# Patient Record
Sex: Female | Born: 1985
Health system: Southern US, Community
[De-identification: ages and names within clinical notes are randomized; demographics above are authoritative.]

## PROBLEM LIST (undated history)

## (undated) ENCOUNTER — Inpatient Hospital Stay (HOSPITAL_COMMUNITY): Payer: Self-pay

## (undated) DIAGNOSIS — F32A Depression, unspecified: Secondary | ICD-10-CM

## (undated) DIAGNOSIS — D219 Benign neoplasm of connective and other soft tissue, unspecified: Secondary | ICD-10-CM

## (undated) DIAGNOSIS — Z789 Other specified health status: Secondary | ICD-10-CM

## (undated) DIAGNOSIS — F329 Major depressive disorder, single episode, unspecified: Secondary | ICD-10-CM

## (undated) HISTORY — PX: WISDOM TOOTH EXTRACTION: SHX21

---

## 2009-09-27 ENCOUNTER — Encounter: Admission: RE | Admit: 2009-09-27 | Discharge: 2009-09-27 | Payer: Self-pay | Admitting: Internal Medicine

## 2011-02-08 IMAGING — US US PELVIS COMPLETE
1 series · 14 of 25 positions shown · non-contrast
Comparison: None.

CLINICAL DATA: Pelvic pain.  Question ovarian cyst.

TRANSABDOMINAL AND TRANSVAGINAL ULTRASOUND OF PELVIS
TECHNIQUE: Both transabdominal and transvaginal ultrasound
examinations of the pelvis were performed including evaluation of
the uterus, ovaries, adnexal regions, and pelvic cul-de-sac.

[Series 1: us pelvis complete · 0.22mm/px · 14 of 85 slices shown]
[im 1/85]
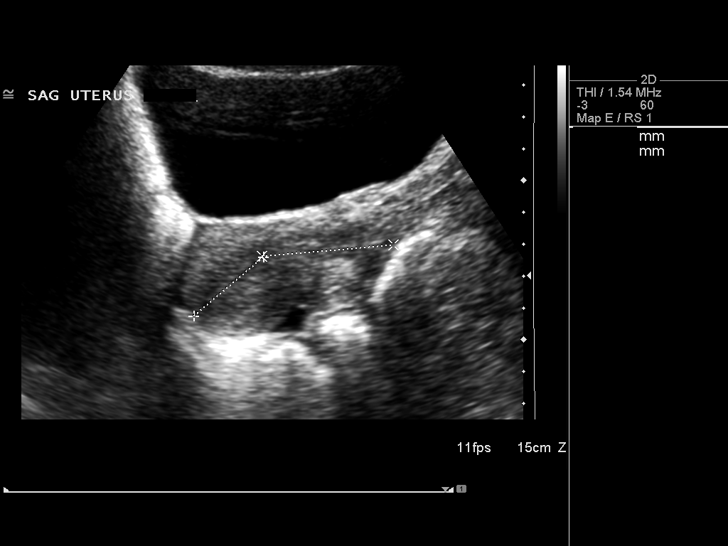
[im 8/85]
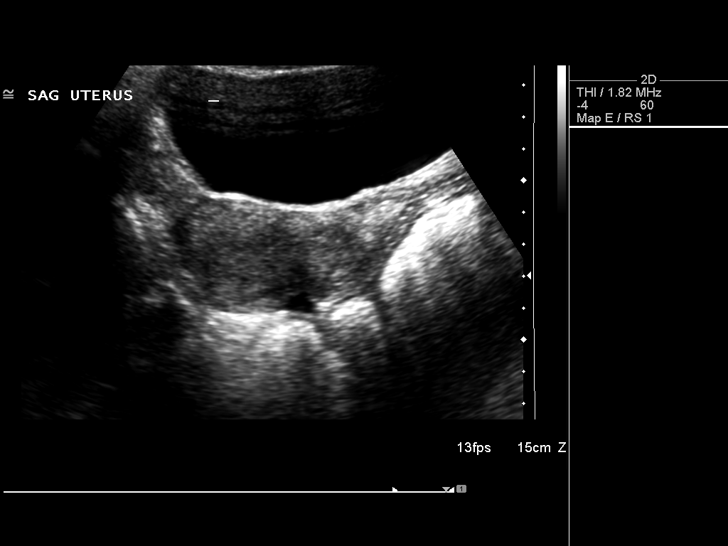
[im 15/85]
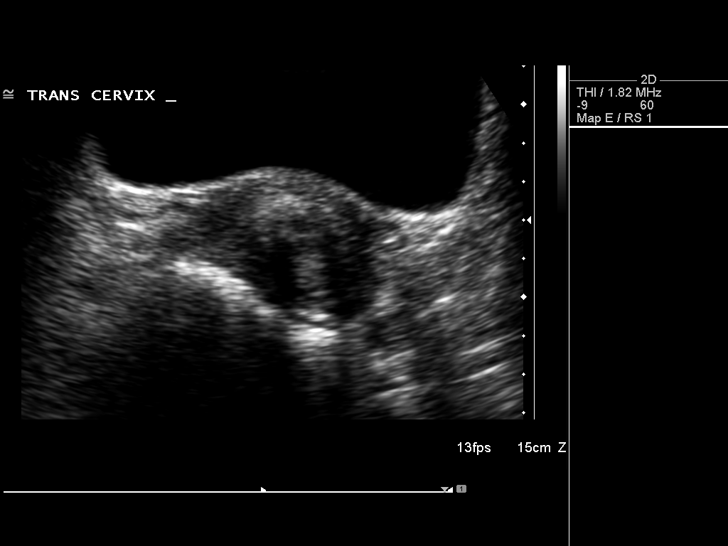
[im 22/85]
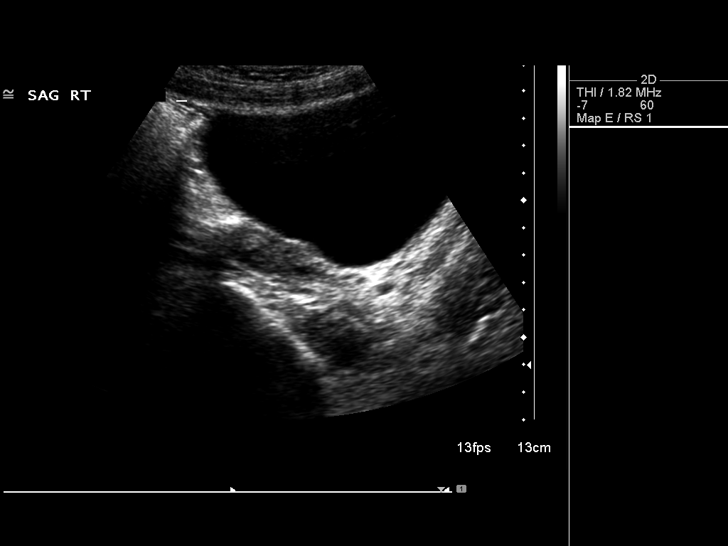
[im 29/85]
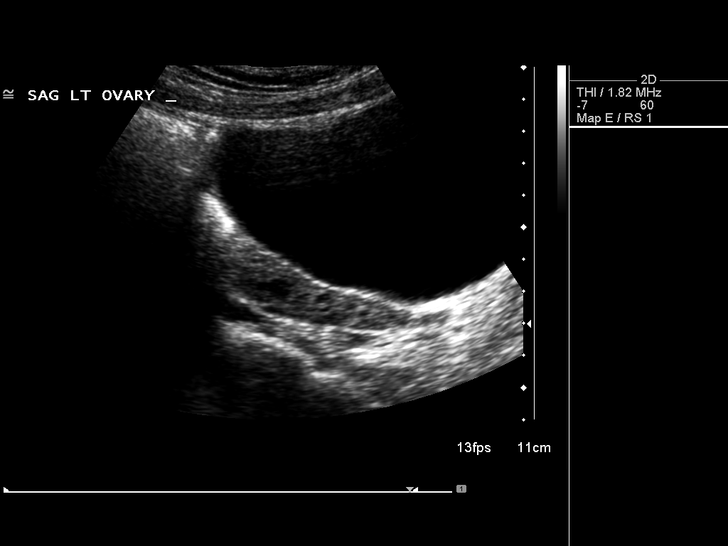
[im 32/85]
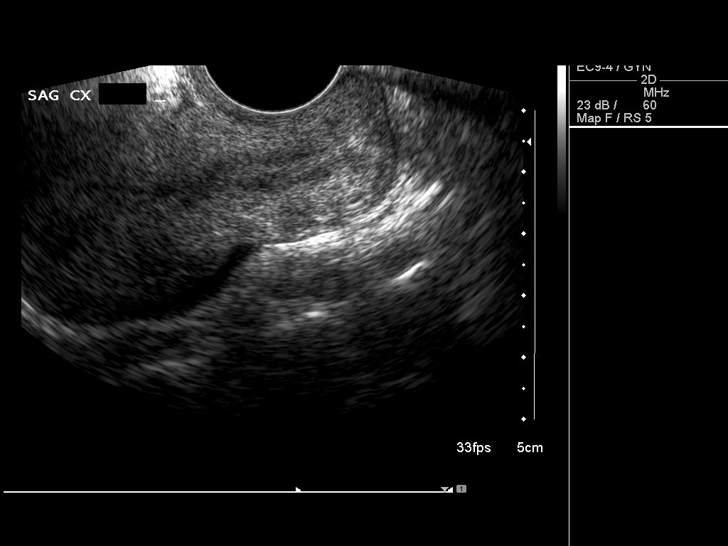
[im 39/85]
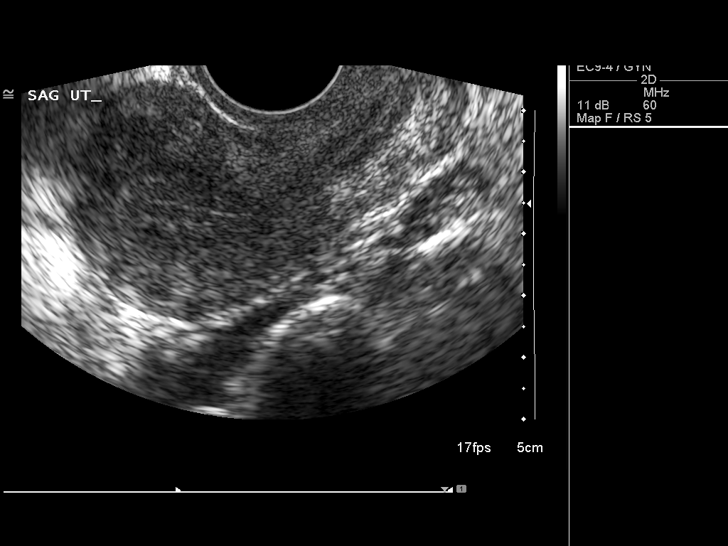
[im 46/85]
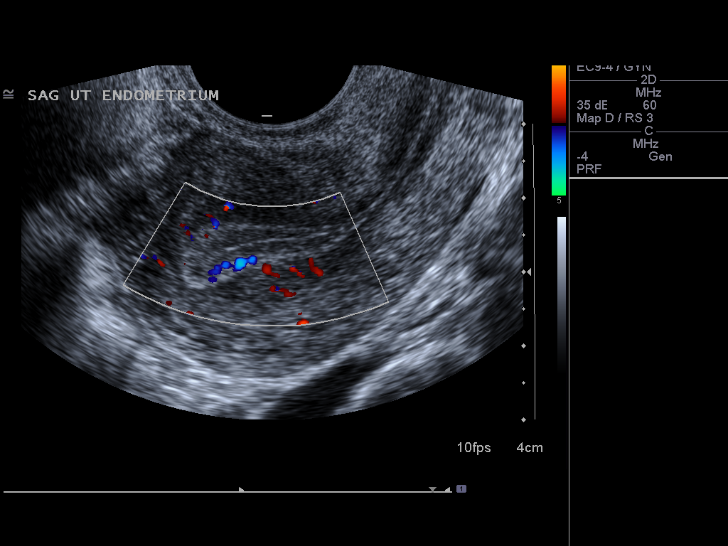
[im 53/85]
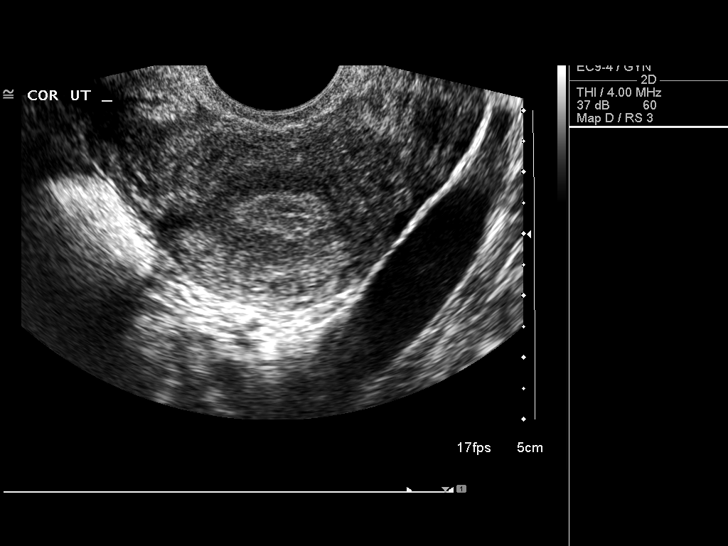
[im 57/85]
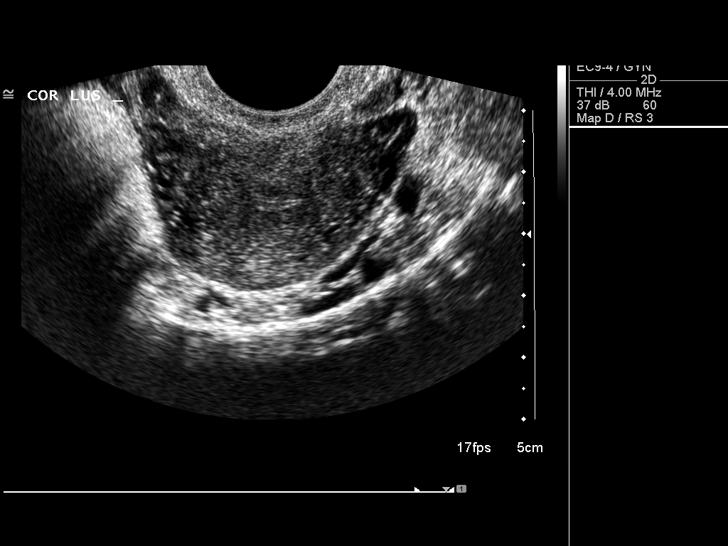
[im 64/85]
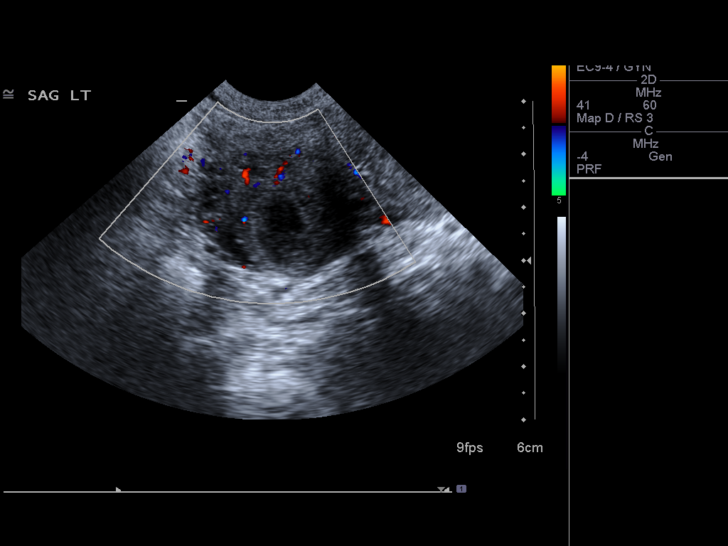
[im 71/85]
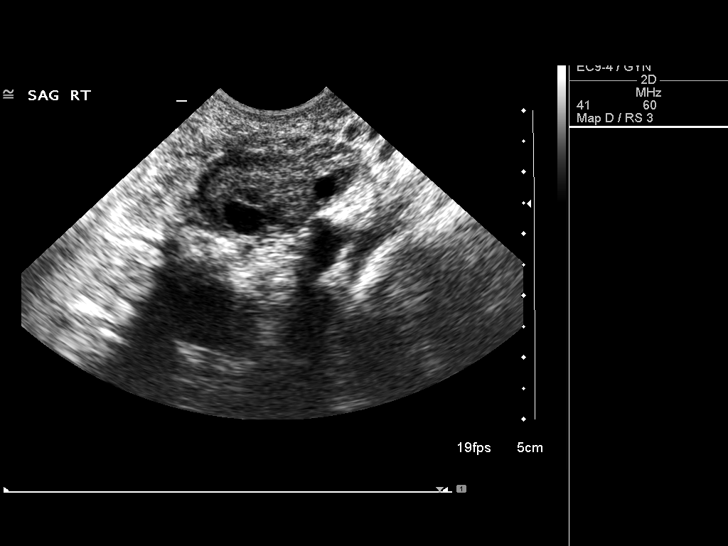
[im 78/85]
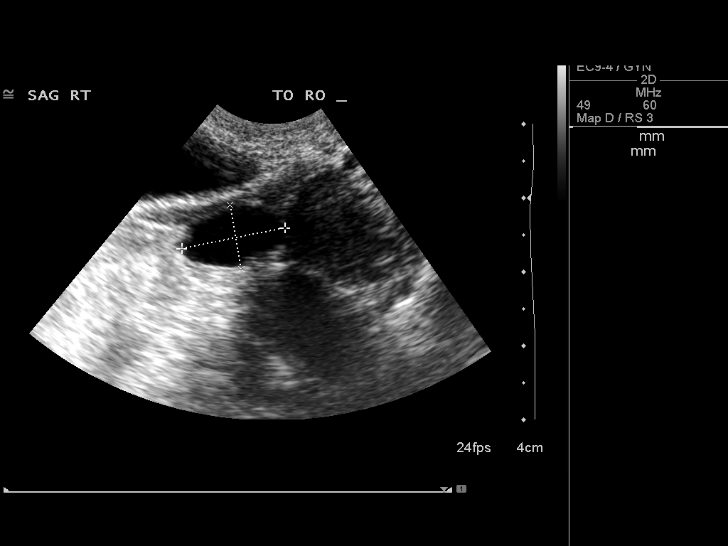
[im 85/85]
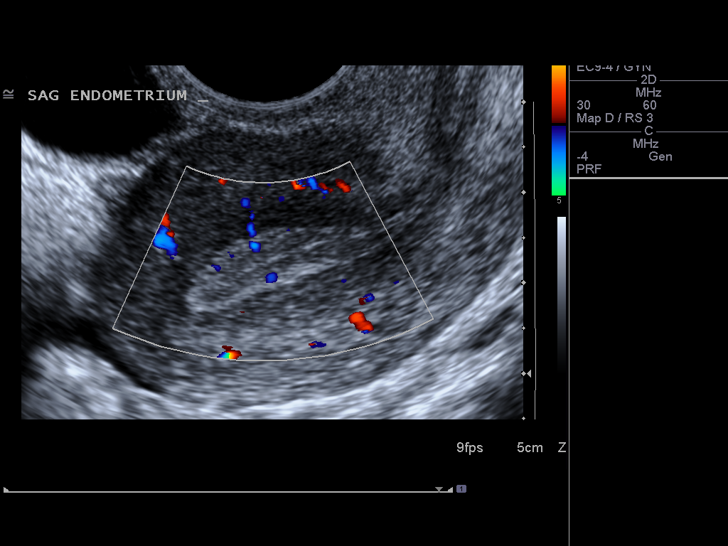

[14 of 25 positions shown; findings below may reference images not displayed]

FINDINGS: Uterus measures 6.8 cm in length and 4.0 x 4.0 cm transversely.

Endometrium measures 7.9 mm in thickness.  Prominent degree of
color flow vascularity within the endometrium.

Right Ovary measures 2.6 x 1.5 x 2.7 cm.  There is a simple cyst in
right ovary measuring 1.4 x 0.9 x 1.6 cm.

Left Ovary measures 2.9 x 2.2 x 2.6 cm.  Findings compatible with a
corpus luteal cyst measuring 1.6 x 1.8 x 1.5 cm.

Other Findings:  Small amount of free pelvic fluid.
IMPRESSION: Simple right ovarian cyst.  Probable corpus luteal cyst on the
left.  Small amount of free fluid.

## 2015-03-08 ENCOUNTER — Other Ambulatory Visit: Payer: Self-pay | Admitting: Family Medicine

## 2015-03-08 DIAGNOSIS — R102 Pelvic and perineal pain: Secondary | ICD-10-CM

## 2015-03-12 ENCOUNTER — Ambulatory Visit
Admission: RE | Admit: 2015-03-12 | Discharge: 2015-03-12 | Disposition: A | Discharge status: Home / Self Care | Payer: 59 | Source: Ambulatory Visit | Attending: Family Medicine | Admitting: Family Medicine

## 2015-03-12 ENCOUNTER — Other Ambulatory Visit: Payer: Self-pay

## 2015-03-12 ENCOUNTER — Other Ambulatory Visit: Payer: Self-pay | Admitting: Family Medicine

## 2015-03-12 ENCOUNTER — Encounter (INDEPENDENT_AMBULATORY_CARE_PROVIDER_SITE_OTHER): Payer: Self-pay

## 2015-03-12 DIAGNOSIS — R102 Pelvic and perineal pain: Secondary | ICD-10-CM

## 2015-06-06 ENCOUNTER — Other Ambulatory Visit: Payer: Self-pay | Admitting: Family Medicine

## 2015-06-06 DIAGNOSIS — N6320 Unspecified lump in the left breast, unspecified quadrant: Secondary | ICD-10-CM

## 2015-06-08 ENCOUNTER — Other Ambulatory Visit: Payer: Self-pay | Admitting: Family Medicine

## 2015-06-08 ENCOUNTER — Ambulatory Visit
Admission: RE | Admit: 2015-06-08 | Discharge: 2015-06-08 | Disposition: A | Discharge status: Home / Self Care | Payer: 59 | Source: Ambulatory Visit | Attending: Family Medicine | Admitting: Family Medicine

## 2015-06-08 DIAGNOSIS — N6320 Unspecified lump in the left breast, unspecified quadrant: Secondary | ICD-10-CM

## 2015-09-12 MED FILL — GYNAZOLE 1 2% CREAM: 2 | 10 days supply | Qty: 5 | Fill #0

## 2015-11-20 DIAGNOSIS — L812 Freckles: Secondary | ICD-10-CM | POA: Diagnosis not present

## 2015-11-20 DIAGNOSIS — D485 Neoplasm of uncertain behavior of skin: Secondary | ICD-10-CM | POA: Diagnosis not present

## 2015-11-20 DIAGNOSIS — D225 Melanocytic nevi of trunk: Secondary | ICD-10-CM | POA: Diagnosis not present

## 2015-11-20 DIAGNOSIS — Q828 Other specified congenital malformations of skin: Secondary | ICD-10-CM | POA: Diagnosis not present

## 2015-11-20 DIAGNOSIS — L821 Other seborrheic keratosis: Secondary | ICD-10-CM | POA: Diagnosis not present

## 2015-11-20 DIAGNOSIS — L858 Other specified epidermal thickening: Secondary | ICD-10-CM | POA: Diagnosis not present

## 2015-12-06 MED FILL — GYNAZOLE 1 2% CREAM: 2 | 10 days supply | Qty: 5 | Fill #1

## 2016-02-21 MED FILL — GYNAZOLE 1 2% CREAM: 2 | 10 days supply | Qty: 5 | Fill #2

## 2016-02-28 MED FILL — GYNAZOLE 1 2% CREAM: 2 | 10 days supply | Qty: 5 | Fill #3

## 2016-04-01 ENCOUNTER — Ambulatory Visit (INDEPENDENT_AMBULATORY_CARE_PROVIDER_SITE_OTHER): Payer: 59 | Admitting: Family Medicine

## 2016-04-01 DIAGNOSIS — M25512 Pain in left shoulder: Secondary | ICD-10-CM | POA: Insufficient documentation

## 2016-04-01 NOTE — Assessment & Plan Note (Signed)
Pain is suggestive of an impingement syndrome as well as popping and changes on ultrasound that suggested a bursitis. She denies any specific injury or surgery but could be exacerbated with her yoga and or stare exercises. - Advised to take ibuprofen 600 mg 3 times a day - She will do this for one week if no improvement follow-up with consideration of subacromial joint injection. - If she is improving after one week and we will just provide her with exercises.

## 2016-04-01 NOTE — Progress Notes (Signed)
  Jasmine Conrad - 30 y.o. female MRN [id number]  Date of birth: 1986-01-11  SUBJECTIVE:  Including CC & ROS.  Chief Complaint  Patient presents with  . Shoulder Pain    Ms. Conrad is a 30 year old female that is presenting with left shoulder pain. This started on Saturday and has progressively gotten worse. It is occurring on the anterior and lateral aspect of her left shoulder. It is achy in nature. She denies any specific incident causing the pain. She performed yoga on Friday night but has not been doing any new exercises. She also does orange theory about once a week and has been doing this for the past 1-2 months. She reports some numbness in the fourth and fifth digit of her left hand. She reports having pain worse with lifting and extension of her shoulder. She's been taking Advil with some to no relief. She denies any fevers, chills, or rashes.  ROS: No unexpected weight loss, swelling, instability, muscle pain, tingling, redness, otherwise see HPI   HISTORY: Past Medical, Surgical, Social, and Family History Reviewed & Updated per EMR.   Pertinent Historical Findings include: PMSHx -  No chronic conditions and no prior surgeries PSHx - occasional alcohol use and denies tobacco use FHx -  mother with history of arthritis Medications - Allegra  DATA REVIEWED: None to review  PHYSICAL EXAM:  VS: BP:110/70  HR: bpm  TEMP: ( )  RESP:   HT:5\' 2"  (157.5 cm)   WT:120 lb (54.4 kg)  BMI:22 PHYSICAL EXAM: Gen: NAD, alert, cooperative with exam, well-appearing HEENT: clear conjunctiva,  CV:  no edema, capillary refill brisk, normal rate Resp: non-labored Skin: no rashes, normal turgor  Neuro: no gross deficits.  Psych:  alert and oriented Shoulder: Inspection reveals no abnormalities, atrophy or asymmetry. Palpation is normal with no tenderness over AC joint  Some pain to palpation over the lateral shoulder near the insertion of the deltoid ROM is full in all planes. Rotator  cuff strength normal throughout. Normal full can test Signs impingement with Hawkins test and empty can. Speeds tests normal. No labral pathology noted with negative Obrien's Normal scapular function observed. No painful arc and no drop arm sign. No apprehension sign Neurovascularly intact  Limited ultrasound: Left shoulder: Biceps tendon effusion on the short axis with no target sign. Subscapularis observed and long and short axis with no hypoechoic changes. Supraspinatus observed to the footplate with no suggestion of partial or full-thickness tears. Hypoechoic changes of the subacromial bursa suggesting a bursitis. Infraspinatus and teres minor viewed and long and short axis and were normal. Able to reproduce  ASSESSMENT & PLAN:   Left shoulder pain Pain is suggestive of an impingement syndrome as well as popping and changes on ultrasound that suggested a bursitis. She denies any specific injury or surgery but could be exacerbated with her yoga and or stare exercises. - Advised to take ibuprofen 600 mg 3 times a day - She will do this for one week if no improvement follow-up with consideration of subacromial joint injection. - If she is improving after one week and we will just provide her with exercises.

## 2016-05-06 DIAGNOSIS — J309 Allergic rhinitis, unspecified: Secondary | ICD-10-CM | POA: Diagnosis not present

## 2016-05-06 DIAGNOSIS — Z23 Encounter for immunization: Secondary | ICD-10-CM | POA: Diagnosis not present

## 2016-05-06 DIAGNOSIS — B373 Candidiasis of vulva and vagina: Secondary | ICD-10-CM | POA: Diagnosis not present

## 2016-05-06 DIAGNOSIS — E559 Vitamin D deficiency, unspecified: Secondary | ICD-10-CM | POA: Diagnosis not present

## 2016-05-07 MED FILL — GYNAZOLE 1 2% CREAM: 2 | 7 days supply | Qty: 5 | Fill #0

## 2016-06-30 MED FILL — GYNAZOLE 1 2% CREAM: 2 | 7 days supply | Qty: 5 | Fill #1

## 2016-07-04 DIAGNOSIS — H5213 Myopia, bilateral: Secondary | ICD-10-CM | POA: Diagnosis not present

## 2016-07-09 MED FILL — GYNAZOLE 1 2% CREAM: 2 | 7 days supply | Qty: 5 | Fill #2

## 2016-07-23 IMAGING — CR DG PELVIS 1-2V
1 series · 1 of 1 positions shown · non-contrast
Comparison: None.

CLINICAL DATA: Right posterior pelvic pain and right hip pain.

EXAM:
PELVIS - 1-2 VIEW

[w pelvis]
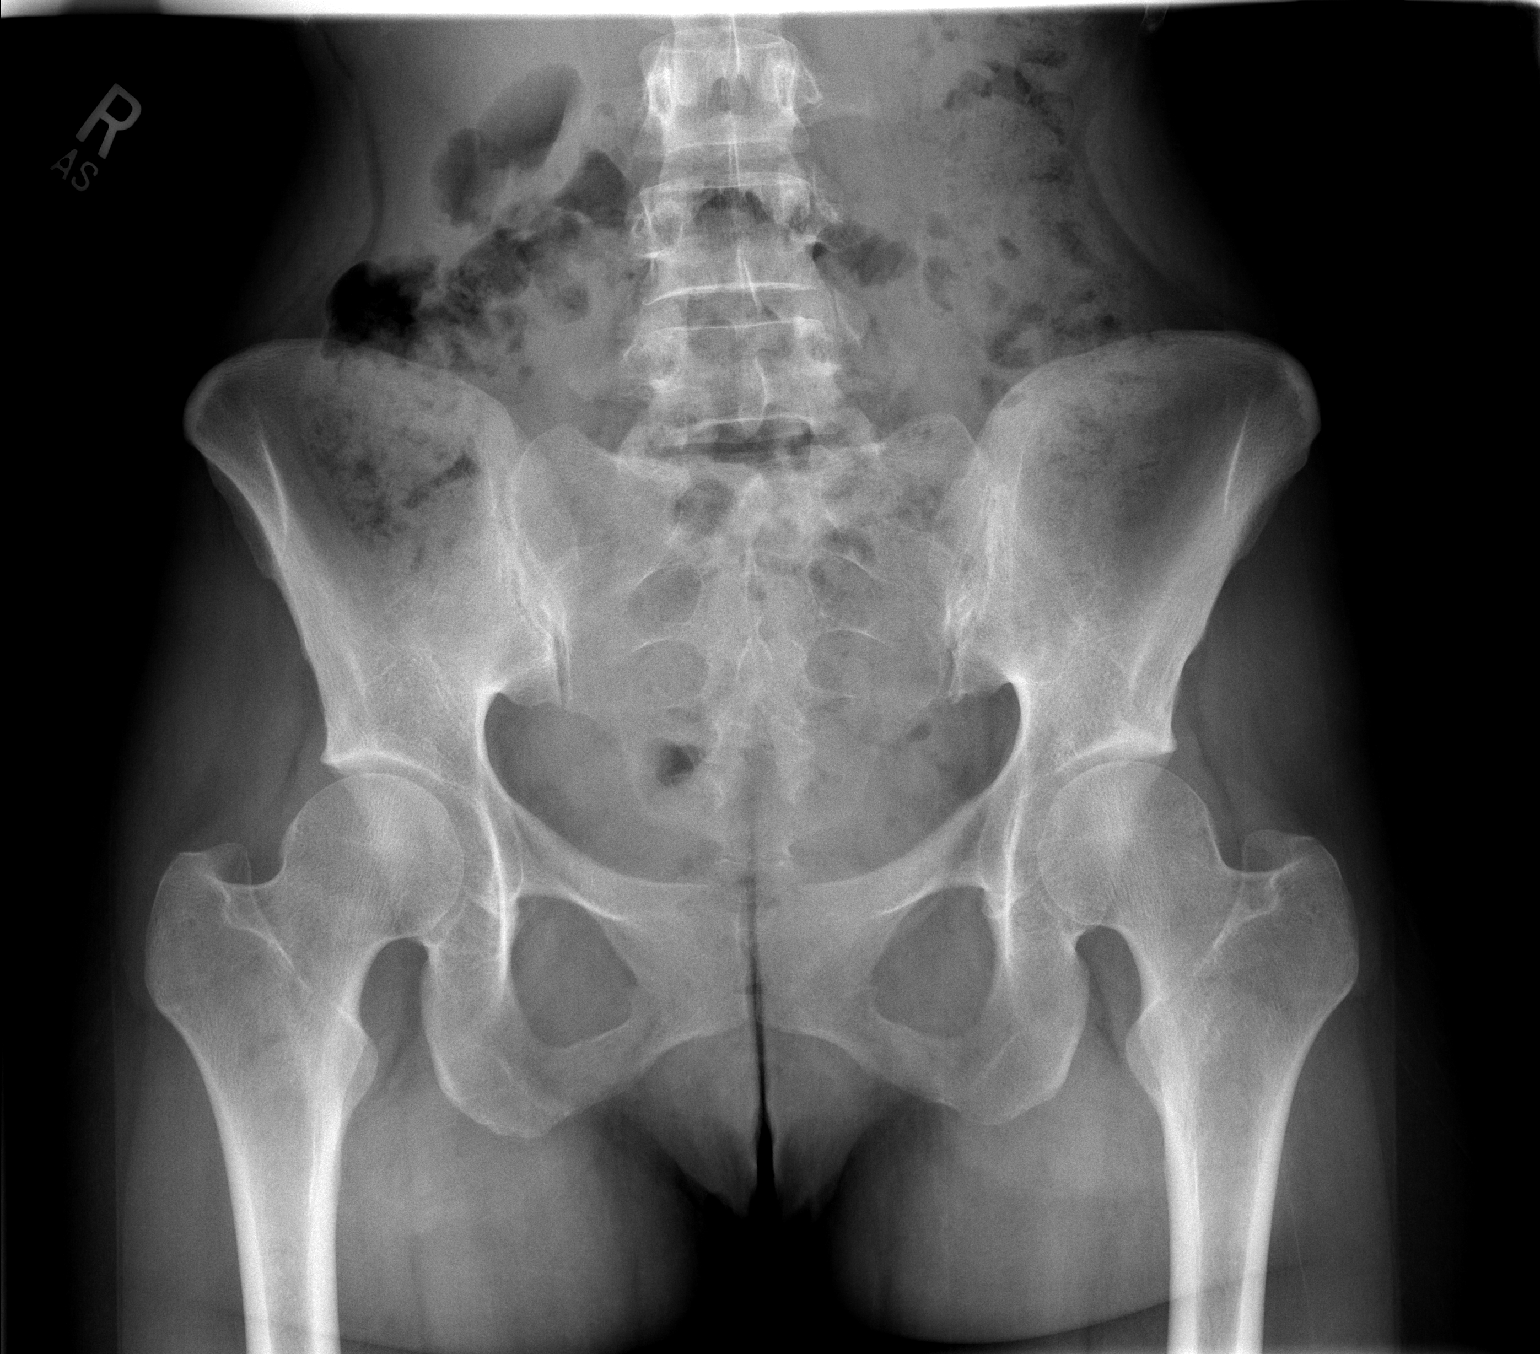

[1 of 1 positions shown; findings below may reference images not displayed]

FINDINGS: No evidence of pelvic fracture or diastasis. The hip joints appear
symmetric and normal bilaterally. Sacroiliac joints appear normal.
No bony lesions or destruction. Soft tissues are unremarkable.
IMPRESSION: Normal pelvis.

## 2016-08-01 DIAGNOSIS — Z0000 Encounter for general adult medical examination without abnormal findings: Secondary | ICD-10-CM | POA: Diagnosis not present

## 2016-08-01 DIAGNOSIS — E559 Vitamin D deficiency, unspecified: Secondary | ICD-10-CM | POA: Diagnosis not present

## 2016-08-07 DIAGNOSIS — Z0000 Encounter for general adult medical examination without abnormal findings: Secondary | ICD-10-CM | POA: Diagnosis not present

## 2016-08-09 ENCOUNTER — Telehealth: Payer: 59 | Admitting: Nurse Practitioner

## 2016-08-09 DIAGNOSIS — J111 Influenza due to unidentified influenza virus with other respiratory manifestations: Secondary | ICD-10-CM

## 2016-08-09 MED ORDER — OSELTAMIVIR PHOSPHATE 75 MG PO CAPS: 75.0000 mg | ORAL_CAPSULE | Freq: Two times a day (BID) | ORAL | 0 refills | Status: DC

## 2016-08-09 NOTE — Progress Notes (Signed)

## 2016-08-10 ENCOUNTER — Ambulatory Visit (HOSPITAL_COMMUNITY)
Admission: EM | Admit: 2016-08-10 | Discharge: 2016-08-10 | Disposition: A | Discharge status: Home / Self Care | Payer: 59 | Attending: Emergency Medicine | Admitting: Emergency Medicine

## 2016-08-10 ENCOUNTER — Encounter (HOSPITAL_COMMUNITY): Payer: Self-pay | Admitting: Emergency Medicine

## 2016-08-10 ENCOUNTER — Ambulatory Visit (INDEPENDENT_AMBULATORY_CARE_PROVIDER_SITE_OTHER): Payer: 59

## 2016-08-10 DIAGNOSIS — J181 Lobar pneumonia, unspecified organism: Secondary | ICD-10-CM

## 2016-08-10 DIAGNOSIS — J188 Other pneumonia, unspecified organism: Secondary | ICD-10-CM | POA: Insufficient documentation

## 2016-08-10 DIAGNOSIS — R0989 Other specified symptoms and signs involving the circulatory and respiratory systems: Secondary | ICD-10-CM | POA: Insufficient documentation

## 2016-08-10 DIAGNOSIS — J111 Influenza due to unidentified influenza virus with other respiratory manifestations: Secondary | ICD-10-CM | POA: Diagnosis not present

## 2016-08-10 DIAGNOSIS — J029 Acute pharyngitis, unspecified: Secondary | ICD-10-CM | POA: Diagnosis not present

## 2016-08-10 DIAGNOSIS — R509 Fever, unspecified: Secondary | ICD-10-CM | POA: Diagnosis not present

## 2016-08-10 DIAGNOSIS — J189 Pneumonia, unspecified organism: Secondary | ICD-10-CM | POA: Diagnosis not present

## 2016-08-10 DIAGNOSIS — Z79899 Other long term (current) drug therapy: Secondary | ICD-10-CM | POA: Diagnosis not present

## 2016-08-10 MED ORDER — LEVOFLOXACIN 500 MG PO TABS: 500.0000 mg | ORAL_TABLET | Freq: Every day | ORAL | 0 refills | Status: DC

## 2016-08-10 MED ORDER — IBUPROFEN 800 MG PO TABS
800.0000 mg | ORAL_TABLET | Freq: Once | ORAL | Status: AC
Start: 2016-08-10 — End: 2016-08-10
  Administered 2016-08-10: 800 mg via ORAL

## 2016-08-10 MED ORDER — ONDANSETRON 4 MG PO TBDP
ORAL_TABLET | ORAL | Status: AC
  Filled 2016-08-10: qty 1

## 2016-08-10 MED ORDER — LEVOFLOXACIN 500 MG PO TABS
500.0000 mg | ORAL_TABLET | Freq: Every day | ORAL | 0 refills | Status: DC
Start: 2016-08-10 — End: 2016-08-10

## 2016-08-10 MED ORDER — LIDOCAINE 5 % EX PTCH: 1.0000 | MEDICATED_PATCH | CUTANEOUS | 0 refills | Status: DC

## 2016-08-10 MED ORDER — ONDANSETRON 4 MG PO TBDP: 4.0000 mg | ORAL_TABLET | Freq: Three times a day (TID) | ORAL | 0 refills | Status: DC | PRN

## 2016-08-10 MED ORDER — ONDANSETRON 4 MG PO TBDP
4.0000 mg | ORAL_TABLET | Freq: Once | ORAL | Status: AC
  Administered 2016-08-10: 4 mg via ORAL

## 2016-08-10 MED ORDER — IBUPROFEN 800 MG PO TABS
ORAL_TABLET | ORAL | Status: AC
  Filled 2016-08-10: qty 1

## 2016-08-10 NOTE — ED Notes (Addendum)
Emilio Math, NP made aware of vitals prior to DC. He acknowledges vitals.

## 2016-08-10 NOTE — ED Triage Notes (Signed)
PT took 1000 mg of tylenol at 1630. PT reports sore throat and headache Wednesday. Friday she began having a cough and fever. PT reports fever has persisted. PT was given tamiflu yesterday AM. [facility] and was negative for strep. PT was given a Z pack this AM and believes she vomited it up. PT has vomited 3-4 times today.

## 2016-08-10 NOTE — ED Provider Notes (Signed)
CSN: [id number]     Arrival date & time 08/10/16  1651 History   None    No chief complaint on file.  (Consider location/radiation/quality/duration/timing/severity/associated sxs/prior Treatment) Patient c/o fever for last few days.  She was seen at an urgent care a few days ago and rx'd some tamiflu which she states she didn't have test for flu.  [facility] and [facility]'d a zpak and told she has rales in her right chest.  She states she is not getting better.   The history is provided by the patient.  Fever  Max temp prior to arrival:  102.5 Temp source:  Oral Severity:  Moderate Onset quality:  Sudden Duration:  5 days Timing:  Constant Progression:  Worsening Chronicity:  New Relieved by:  Nothing Worsened by:  Nothing Ineffective treatments:  Ibuprofen and acetaminophen Associated symptoms: chest pain, chills, cough, nausea and sore throat     History reviewed. No pertinent past medical history. History reviewed. No pertinent surgical history. No family history on file. Social History  Substance Use Topics  . Smoking status: Never Smoker  . Smokeless tobacco: Never Used  . Alcohol use Yes     Comment: weekends   OB History    No data available     Review of Systems  Constitutional: Positive for chills and fever.  HENT: Positive for sore throat.   Eyes: Negative.   Respiratory: Positive for cough.   Cardiovascular: Positive for chest pain.  Gastrointestinal: Positive for nausea.  Endocrine: Negative.   Genitourinary: Negative.   Musculoskeletal: Negative.   Allergic/Immunologic: Negative.   Neurological: Negative.   Hematological: Negative.   Psychiatric/Behavioral: Negative.     Allergies  Patient has no known allergies.  Home Medications   Prior to Admission medications   Medication Sig Start Date End Date Taking? Authorizing Provider  oseltamivir (TAMIFLU) 75 MG capsule Take 1 capsule (75 mg total) by mouth 2 (two) times daily.  08/09/16  Yes Mary-Margaret Hassell Done, FNP  GYNAZOLE-1 2 % CREA  02/28/16   Historical Provider, MD  levofloxacin (LEVAQUIN) 500 MG tablet Take 1 tablet (500 mg total) by mouth daily. 08/10/16   Lysbeth Penner, FNP  lidocaine (LIDODERM) 5 % Place 1 patch onto the skin daily. Remove & Discard patch within 12 hours or as directed by MD 08/10/16   Lysbeth Penner, FNP  ondansetron (ZOFRAN ODT) 4 MG disintegrating tablet Take 1 tablet (4 mg total) by mouth every 8 (eight) hours as needed for nausea or vomiting. 08/10/16   Lysbeth Penner, FNP   Meds Ordered and Administered this Visit   Medications  ondansetron (ZOFRAN-ODT) disintegrating tablet 4 mg (4 mg Oral Given 08/10/16 1736)  ibuprofen (ADVIL,MOTRIN) tablet 800 mg (800 mg Oral Given 08/10/16 1736)    BP (!) 87/66 (BP Location: Left Arm)   Pulse 109   Temp 101.4 F (38.6 C) (Oral)   Resp 16   Ht 5\' 2"  (1.575 m)   Wt 120 lb (54.4 kg)   LMP 08/08/2016   SpO2 98%   BMI 21.95 kg/m  No data found.   Physical Exam  Constitutional: She is oriented to person, place, and time.  Acutely ill appearing  HENT:  Head: Normocephalic and atraumatic.  Right Ear: External ear normal.  Left Ear: External ear normal.  Mouth/Throat: Oropharynx is clear and moist.  Eyes: Conjunctivae and EOM are normal. Pupils are equal, round, and reactive to light.  Neck: Normal range of motion.  Neck supple.  Cardiovascular: Normal rate, regular rhythm and normal heart sounds.   Pulmonary/Chest: Effort normal. She has rales.  Abdominal: Soft. Bowel sounds are normal.  Musculoskeletal: Normal range of motion.  Neurological: She is alert and oriented to person, place, and time.  Skin: Skin is warm and dry.  Nursing note and vitals reviewed.   Urgent Care Course   Clinical Course     Procedures (including critical care time)  Labs Review Labs Reviewed - No data to display  Imaging Review Dg Chest 2 View  Result Date: 08/10/2016 CLINICAL DATA:   Fever.  Right sided rales. EXAM: CHEST  2 VIEW COMPARISON:  None. FINDINGS: Normal heart size. Normal mediastinal contour. No pneumothorax. No pleural effusion. Mild patchy opacity in the central right lower lobe. Otherwise clear lungs. IMPRESSION: Mild patchy central right lower lobe opacity, which could represent a mild pneumonia. Recommend follow-up PA and lateral post treatment chest radiographs in 4-6 weeks. Electronically Signed   By: Ilona Sorrel M.D.   On: 08/10/2016 17:54     Visual Acuity Review  Right Eye Distance:   Left Eye Distance:   Bilateral Distance:    Right Eye Near:   Left Eye Near:    Bilateral Near:         MDM   1. Community acquired pneumonia of right middle lobe of lung ([place])    Levaquin 500mg  one po qd x 7 days #7     Lysbeth Penner, FNP 08/10/16 [id number]

## 2016-08-10 NOTE — ED Notes (Addendum)
PT discharged by W. Oxford, NP.

## 2016-08-11 LAB — POCT RAPID STREP A: STREPTOCOCCUS, GROUP A SCREEN (DIRECT): NEGATIVE

## 2016-08-13 LAB — CULTURE, GROUP A STREP (THRC)

## 2016-08-15 MED FILL — GYNAZOLE 1 2% CREAM: 2 | 7 days supply | Qty: 5 | Fill #3

## 2016-09-04 DIAGNOSIS — J189 Pneumonia, unspecified organism: Secondary | ICD-10-CM | POA: Diagnosis not present

## 2016-09-04 DIAGNOSIS — R0981 Nasal congestion: Secondary | ICD-10-CM | POA: Diagnosis not present

## 2016-09-04 DIAGNOSIS — B373 Candidiasis of vulva and vagina: Secondary | ICD-10-CM | POA: Diagnosis not present

## 2016-09-04 DIAGNOSIS — Z6822 Body mass index (BMI) 22.0-22.9, adult: Secondary | ICD-10-CM | POA: Diagnosis not present

## 2016-10-20 MED FILL — GYNAZOLE 1 2% CREAM: 2 | 5 days supply | Qty: 5 | Fill #0

## 2016-11-18 DIAGNOSIS — Z01419 Encounter for gynecological examination (general) (routine) without abnormal findings: Secondary | ICD-10-CM | POA: Diagnosis not present

## 2016-11-18 DIAGNOSIS — Z6823 Body mass index (BMI) 23.0-23.9, adult: Secondary | ICD-10-CM | POA: Diagnosis not present

## 2016-11-18 MED FILL — BETAMETHASONE 0.1% OINTMENT: 0.1 | 30 days supply | Qty: 45 | Fill #0

## 2016-11-19 MED FILL — LIDOCAINE 5% OINTMENT: 5 | 30 days supply | Qty: 30 | Fill #0

## 2016-12-02 DIAGNOSIS — R102 Pelvic and perineal pain: Secondary | ICD-10-CM | POA: Diagnosis not present

## 2017-01-21 DIAGNOSIS — N911 Secondary amenorrhea: Secondary | ICD-10-CM | POA: Diagnosis not present

## 2017-02-05 DIAGNOSIS — Z3685 Encounter for antenatal screening for Streptococcus B: Secondary | ICD-10-CM | POA: Diagnosis not present

## 2017-02-05 DIAGNOSIS — Z3401 Encounter for supervision of normal first pregnancy, first trimester: Secondary | ICD-10-CM | POA: Diagnosis not present

## 2017-02-05 DIAGNOSIS — Z13228 Encounter for screening for other metabolic disorders: Secondary | ICD-10-CM | POA: Diagnosis not present

## 2017-02-05 LAB — OB RESULTS CONSOLE GC/CHLAMYDIA
CHLAMYDIA, DNA PROBE: NEGATIVE
Gonorrhea: NEGATIVE

## 2017-02-05 LAB — OB RESULTS CONSOLE RUBELLA ANTIBODY, IGM: Rubella: IMMUNE

## 2017-02-05 LAB — OB RESULTS CONSOLE RPR: RPR: NONREACTIVE

## 2017-02-05 LAB — OB RESULTS CONSOLE HIV ANTIBODY (ROUTINE TESTING): HIV: NONREACTIVE

## 2017-02-05 LAB — OB RESULTS CONSOLE ABO/RH: RH Type: POSITIVE

## 2017-02-05 LAB — OB RESULTS CONSOLE ANTIBODY SCREEN: Antibody Screen: NEGATIVE

## 2017-02-05 LAB — OB RESULTS CONSOLE HEPATITIS B SURFACE ANTIGEN: Hepatitis B Surface Ag: NEGATIVE

## 2017-02-17 DIAGNOSIS — Z3401 Encounter for supervision of normal first pregnancy, first trimester: Secondary | ICD-10-CM | POA: Diagnosis not present

## 2017-02-17 DIAGNOSIS — Z113 Encounter for screening for infections with a predominantly sexual mode of transmission: Secondary | ICD-10-CM | POA: Diagnosis not present

## 2017-03-06 DIAGNOSIS — Z3491 Encounter for supervision of normal pregnancy, unspecified, first trimester: Secondary | ICD-10-CM | POA: Diagnosis not present

## 2017-03-06 DIAGNOSIS — Z36 Encounter for antenatal screening for chromosomal anomalies: Secondary | ICD-10-CM | POA: Diagnosis not present

## 2017-03-09 DIAGNOSIS — Z3683 Encounter for fetal screening for congenital cardiac abnormalities: Secondary | ICD-10-CM | POA: Diagnosis not present

## 2017-03-11 DIAGNOSIS — J069 Acute upper respiratory infection, unspecified: Secondary | ICD-10-CM | POA: Diagnosis not present

## 2017-03-11 DIAGNOSIS — J029 Acute pharyngitis, unspecified: Secondary | ICD-10-CM | POA: Diagnosis not present

## 2017-03-11 DIAGNOSIS — Z6823 Body mass index (BMI) 23.0-23.9, adult: Secondary | ICD-10-CM | POA: Diagnosis not present

## 2017-04-21 DIAGNOSIS — Z3A18 18 weeks gestation of pregnancy: Secondary | ICD-10-CM | POA: Diagnosis not present

## 2017-04-21 DIAGNOSIS — Z3480 Encounter for supervision of other normal pregnancy, unspecified trimester: Secondary | ICD-10-CM | POA: Diagnosis not present

## 2017-04-21 DIAGNOSIS — Z3400 Encounter for supervision of normal first pregnancy, unspecified trimester: Secondary | ICD-10-CM | POA: Diagnosis not present

## 2017-04-21 DIAGNOSIS — Z363 Encounter for antenatal screening for malformations: Secondary | ICD-10-CM | POA: Diagnosis not present

## 2017-06-15 DIAGNOSIS — Z23 Encounter for immunization: Secondary | ICD-10-CM | POA: Diagnosis not present

## 2017-06-15 DIAGNOSIS — Z3480 Encounter for supervision of other normal pregnancy, unspecified trimester: Secondary | ICD-10-CM | POA: Diagnosis not present

## 2017-06-15 DIAGNOSIS — Z3402 Encounter for supervision of normal first pregnancy, second trimester: Secondary | ICD-10-CM | POA: Diagnosis not present

## 2017-07-02 ENCOUNTER — Observation Stay (HOSPITAL_COMMUNITY)
Admission: AD | Admit: 2017-07-02 | Discharge: 2017-07-03 | Disposition: A | Discharge status: Home / Self Care | Payer: 59 | Source: Ambulatory Visit | Attending: Obstetrics and Gynecology | Admitting: Obstetrics and Gynecology

## 2017-07-02 ENCOUNTER — Encounter (HOSPITAL_COMMUNITY): Payer: Self-pay

## 2017-07-02 DIAGNOSIS — Z3A29 29 weeks gestation of pregnancy: Secondary | ICD-10-CM | POA: Insufficient documentation

## 2017-07-02 DIAGNOSIS — O9A213 Injury, poisoning and certain other consequences of external causes complicating pregnancy, third trimester: Secondary | ICD-10-CM | POA: Diagnosis not present

## 2017-07-02 DIAGNOSIS — W541XXA Struck by dog, initial encounter: Secondary | ICD-10-CM | POA: Diagnosis not present

## 2017-07-02 DIAGNOSIS — O9A219 Injury, poisoning and certain other consequences of external causes complicating pregnancy, unspecified trimester: Secondary | ICD-10-CM | POA: Diagnosis present

## 2017-07-02 DIAGNOSIS — R102 Pelvic and perineal pain: Secondary | ICD-10-CM | POA: Insufficient documentation

## 2017-07-02 DIAGNOSIS — O26899 Other specified pregnancy related conditions, unspecified trimester: Principal | ICD-10-CM | POA: Insufficient documentation

## 2017-07-02 DIAGNOSIS — Z79899 Other long term (current) drug therapy: Secondary | ICD-10-CM | POA: Insufficient documentation

## 2017-07-02 DIAGNOSIS — O4703 False labor before 37 completed weeks of gestation, third trimester: Secondary | ICD-10-CM | POA: Diagnosis not present

## 2017-07-02 HISTORY — DX: Other specified health status: Z78.9

## 2017-07-02 LAB — URINALYSIS, ROUTINE W REFLEX MICROSCOPIC
Bilirubin Urine: NEGATIVE
Glucose, UA: NEGATIVE mg/dL
HGB URINE DIPSTICK: NEGATIVE
Ketones, ur: NEGATIVE mg/dL
Leukocytes, UA: NEGATIVE
NITRITE: NEGATIVE
PROTEIN: NEGATIVE mg/dL
Specific Gravity, Urine: 1.004 — ABNORMAL LOW (ref 1.005–1.030)
pH: 7 (ref 5.0–8.0)

## 2017-07-02 LAB — FETAL FIBRONECTIN: FETAL FIBRONECTIN: NEGATIVE

## 2017-07-02 NOTE — MAU Provider Note (Signed)
Chief Complaint:  Abdominal Pain   First Provider Initiated Contact with Patient 07/02/17 2153     HPI: Jasmine Conrad is a 31 y.o. G1P0 at 68w1dwho presents to maternity admissions reporting pelvic cramping since having a dog jump onto her abdomen.  No leaking or bleeding.. She reports good fetal movement, denies LOF, vaginal bleeding, vaginal itching/burning, urinary symptoms, h/a, dizziness, n/v, diarrhea, constipation or fever/chills.  She denies headache, visual changes or RUQ abdominal pain.  Abdominal Pain  This is a new problem. The current episode started today. The onset quality is gradual. The problem occurs intermittently. The problem has been unchanged. The pain is located in the generalized abdominal region. The pain is moderate. The quality of the pain is cramping. The abdominal pain does not radiate. Pertinent negatives include no constipation, diarrhea, dysuria, fever, headaches, myalgias, nausea or vomiting. The pain is aggravated by palpation. The pain is relieved by nothing. She has tried nothing for the symptoms.   RN Note: Pt got jumped on stomach by a dog (70lb). Started having cramping off and on since. Not as strong as when it first happened around 7pm. Denies and vag bleeding or discharge at this time. reports fetal movement since incident   Past Medical History: Past Medical History:  Diagnosis Date  . Medical history non-contributory     Past obstetric history: OB History  Gravida Para Term Preterm AB Living  1            SAB TAB Ectopic Multiple Live Births               # Outcome Date GA Lbr Len/2nd Weight Sex Delivery Anes PTL Lv  1 Current               Past Surgical History: Past Surgical History:  Procedure Laterality Date  . WISDOM TOOTH EXTRACTION      Family History: History reviewed. No pertinent family history.  Social History: Social History  Substance Use Topics  . Smoking status: Never Smoker  . Smokeless tobacco: Never Used   . Alcohol use Yes     Comment: not since pregnancy    Allergies: No Known Allergies  Meds:  Prescriptions Prior to Admission  Medication Sig Dispense Refill Last Dose  . GYNAZOLE-1 2 % CREA   3   . levofloxacin (LEVAQUIN) 500 MG tablet Take 1 tablet (500 mg total) by mouth daily. 7 tablet 0   . lidocaine (LIDODERM) 5 % Place 1 patch onto the skin daily. Remove & Discard patch within 12 hours or as directed by MD 30 patch 0   . ondansetron (ZOFRAN ODT) 4 MG disintegrating tablet Take 1 tablet (4 mg total) by mouth every 8 (eight) hours as needed for nausea or vomiting. 20 tablet 0   . oseltamivir (TAMIFLU) 75 MG capsule Take 1 capsule (75 mg total) by mouth 2 (two) times daily. 10 capsule 0 08/10/2016 at Unknown time    I have reviewed patient's Past Medical Hx, Surgical Hx, Family Hx, Social Hx, medications and allergies.   ROS:  Review of Systems  Constitutional: Negative for fever.  Gastrointestinal: Positive for abdominal pain. Negative for constipation, diarrhea, nausea and vomiting.  Genitourinary: Negative for dysuria.  Musculoskeletal: Negative for myalgias.  Neurological: Negative for headaches.   Other systems negative  Physical Exam  No data found.  Constitutional: Well-developed, well-nourished female in no acute distress.  Cardiovascular: normal rate and rhythm Respiratory: normal effort, clear to auscultation bilaterally GI: Abd soft,  mildly tender throughout, gravid appropriate for gestational age.   No rebound or guarding. MS: Extremities nontender, no edema, normal ROM Neurologic: Alert and oriented x 4.  GU: Neg CVAT.   FHT:  Baseline 140 , moderate variability, accelerations present, no decelerations Contractions: q 2-5 mins Irregular    Labs: Results for orders placed or performed during the hospital encounter of 07/02/17 (from the past 24 hour(s))  Urinalysis, Routine w reflex microscopic     Status: Abnormal   Collection Time: 07/02/17  8:53 PM   Result Value Ref Range   Color, Urine YELLOW YELLOW   APPearance HAZY (A) CLEAR   Specific Gravity, Urine 1.004 (L) 1.005 - 1.030   pH 7.0 5.0 - 8.0   Glucose, UA NEGATIVE NEGATIVE mg/dL   Hgb urine dipstick NEGATIVE NEGATIVE   Bilirubin Urine NEGATIVE NEGATIVE   Ketones, ur NEGATIVE NEGATIVE mg/dL   Protein, ur NEGATIVE NEGATIVE mg/dL   Nitrite NEGATIVE NEGATIVE   Leukocytes, UA NEGATIVE NEGATIVE  Fetal fibronectin     Status: None   Collection Time: 07/02/17 10:10 PM  Result Value Ref Range   Fetal Fibronectin NEGATIVE NEGATIVE    Imaging:  No results found.  MAU Course/MDM: I have ordered labs and reviewed results.  FFn was negative.  UA showed dilute urine NST reviewed and found Category I, reactive.   She was observed over 4 hours. UCs persisted but then lessened considerably the third hour. Then just before the 4th hour they increased in intensity and frequency.  So decision was made to admit her for observation. Consult Dr Helane Rima with presentation, exam findings and test results.  Treatments in MAU included EFM.    Assessment: Single IUP at [redacted]w[redacted]d S/P blunt trauma Uterine contractions   Plan: Admit to Antenatal unit MD to follow   Hansel Feinstein CNM, MSN Certified Nurse-Midwife 07/02/2017 9:53 PM

## 2017-07-02 NOTE — MAU Note (Signed)
Pt got jumped on stomach by a dog (70lb). Started having cramping off and on since. Not as strong as when it first happened around 7pm. Denies and vag bleeding or discharge at this time. reports fetal movement since incident

## 2017-07-03 ENCOUNTER — Encounter (HOSPITAL_COMMUNITY): Payer: Self-pay | Admitting: *Deleted

## 2017-07-03 DIAGNOSIS — Z79899 Other long term (current) drug therapy: Secondary | ICD-10-CM | POA: Diagnosis not present

## 2017-07-03 DIAGNOSIS — W541XXA Struck by dog, initial encounter: Secondary | ICD-10-CM | POA: Diagnosis not present

## 2017-07-03 DIAGNOSIS — O26899 Other specified pregnancy related conditions, unspecified trimester: Secondary | ICD-10-CM | POA: Diagnosis not present

## 2017-07-03 DIAGNOSIS — O9A213 Injury, poisoning and certain other consequences of external causes complicating pregnancy, third trimester: Secondary | ICD-10-CM | POA: Diagnosis not present

## 2017-07-03 DIAGNOSIS — Z3A29 29 weeks gestation of pregnancy: Secondary | ICD-10-CM | POA: Diagnosis not present

## 2017-07-03 DIAGNOSIS — O4703 False labor before 37 completed weeks of gestation, third trimester: Secondary | ICD-10-CM | POA: Diagnosis not present

## 2017-07-03 DIAGNOSIS — R102 Pelvic and perineal pain: Secondary | ICD-10-CM | POA: Diagnosis not present

## 2017-07-03 DIAGNOSIS — O9A219 Injury, poisoning and certain other consequences of external causes complicating pregnancy, unspecified trimester: Secondary | ICD-10-CM | POA: Diagnosis present

## 2017-07-03 MED ORDER — ACETAMINOPHEN 325 MG PO TABS: 650.0000 mg | ORAL_TABLET | ORAL | Status: DC | PRN

## 2017-07-03 MED ORDER — DOCUSATE SODIUM 100 MG PO CAPS
100.0000 mg | ORAL_CAPSULE | Freq: Every day | ORAL | Status: DC
  Administered 2017-07-03: 100 mg via ORAL
  Filled 2017-07-03 (×2): qty 1

## 2017-07-03 MED ORDER — PRENATAL MULTIVITAMIN CH
1.0000 | ORAL_TABLET | Freq: Every day | ORAL | Status: DC
  Administered 2017-07-03: 1 via ORAL
  Filled 2017-07-03 (×2): qty 1

## 2017-07-03 MED ORDER — CALCIUM CARBONATE ANTACID 500 MG PO CHEW: 2.0000 | CHEWABLE_TABLET | ORAL | Status: DC | PRN

## 2017-07-03 MED ORDER — ZOLPIDEM TARTRATE 5 MG PO TABS: 5.0000 mg | ORAL_TABLET | Freq: Every evening | ORAL | Status: DC | PRN

## 2017-07-03 NOTE — Progress Notes (Signed)
Pt doing well this morning - feeling better.  No ctx, vb or lof.  + FM.  Contractions have resolved  FHT cat 1 Toco quiet Cvx deferred  A/P:  29 wks admitted for overnight obs after abdominal trauma Plan discharge home &  f/u 1 wk

## 2017-07-03 NOTE — Discharge Summary (Signed)
Physician Discharge Summary  Patient ID: Jasmine Conrad MRN: [id number] DOB/AGE: 12-03-1985 31 y.o.  Admit date: 07/02/2017 Discharge date: 07/03/2017  Admission Diagnoses: pregnancy, abdominal trauma  Discharge Diagnoses:  Active Problems:   Trauma during pregnancy   Discharged Condition: stable  Hospital Course: Pt was admitted for overnight observation to evaluate ctx after episode of trauma to abdomen.  Contractions resolved overnight and felt to be stable for discharge.    Consults: None  Significant Diagnostic Studies: labs: FFN  Discharge Exam: Blood pressure 102/63, pulse 82, temperature 98.1 F (36.7 C), temperature source Oral, resp. rate 18, height 5\' 2"  (1.575 m), weight 145 lb (65.8 kg), SpO2 99 %. General appearance: alert and cooperative GI: normal findings: gravid, NT  Disposition: 01-Home or Self Care  Discharge Instructions    Diet - low sodium heart healthy    Complete by:  As directed    Increase activity slowly    Complete by:  As directed      Allergies as of 07/03/2017   No Known Allergies     Medication List    STOP taking these medications   levofloxacin 500 MG tablet Commonly known as:  LEVAQUIN   lidocaine 5 % Commonly known as:  LIDODERM     TAKE these medications   acetaminophen 500 MG tablet Commonly known as:  TYLENOL Take 500 mg by mouth every 6 (six) hours as needed for mild pain or headache.   calcium carbonate 500 MG chewable tablet Commonly known as:  TUMS - dosed in mg elemental calcium Chew 1 tablet by mouth 2 (two) times daily as needed for indigestion or heartburn.   Fish Oil 875 MG Chew Chew 1 tablet by mouth daily.   PRENATAL 19 29-1 MG Chew Chew 1 tablet by mouth daily.      Follow-up Information    [facility] Schedule an appointment as soon as possible for a visit in 1 week(s).   Contact information: [address] [place] [address] [phone]            Signed: Marylynn Pearson 07/03/2017, 10:20 AM

## 2017-07-03 NOTE — Progress Notes (Signed)
Patient discharged to home and taken down in a wheelchair. Discharge instructions reviewed with patient and husband.

## 2017-07-07 ENCOUNTER — Encounter (HOSPITAL_COMMUNITY): Payer: Self-pay

## 2017-07-07 ENCOUNTER — Inpatient Hospital Stay (HOSPITAL_COMMUNITY): Payer: 59

## 2017-07-07 ENCOUNTER — Inpatient Hospital Stay (HOSPITAL_COMMUNITY)
Admission: AD | Admit: 2017-07-07 | Discharge: 2017-07-07 | Disposition: A | Discharge status: Home / Self Care | Payer: 59 | Source: Ambulatory Visit | Attending: Obstetrics and Gynecology | Admitting: Obstetrics and Gynecology

## 2017-07-07 DIAGNOSIS — Z3A29 29 weeks gestation of pregnancy: Secondary | ICD-10-CM | POA: Diagnosis not present

## 2017-07-07 DIAGNOSIS — R102 Pelvic and perineal pain: Secondary | ICD-10-CM

## 2017-07-07 DIAGNOSIS — R109 Unspecified abdominal pain: Secondary | ICD-10-CM | POA: Diagnosis not present

## 2017-07-07 DIAGNOSIS — Z809 Family history of malignant neoplasm, unspecified: Secondary | ICD-10-CM | POA: Insufficient documentation

## 2017-07-07 DIAGNOSIS — O3413 Maternal care for benign tumor of corpus uteri, third trimester: Secondary | ICD-10-CM | POA: Diagnosis not present

## 2017-07-07 DIAGNOSIS — O321XX0 Maternal care for breech presentation, not applicable or unspecified: Secondary | ICD-10-CM | POA: Insufficient documentation

## 2017-07-07 DIAGNOSIS — Z8249 Family history of ischemic heart disease and other diseases of the circulatory system: Secondary | ICD-10-CM | POA: Insufficient documentation

## 2017-07-07 DIAGNOSIS — D259 Leiomyoma of uterus, unspecified: Secondary | ICD-10-CM | POA: Diagnosis not present

## 2017-07-07 DIAGNOSIS — O26893 Other specified pregnancy related conditions, third trimester: Secondary | ICD-10-CM | POA: Diagnosis not present

## 2017-07-07 DIAGNOSIS — O479 False labor, unspecified: Secondary | ICD-10-CM

## 2017-07-07 DIAGNOSIS — O4703 False labor before 37 completed weeks of gestation, third trimester: Secondary | ICD-10-CM | POA: Diagnosis not present

## 2017-07-07 DIAGNOSIS — Z9889 Other specified postprocedural states: Secondary | ICD-10-CM | POA: Diagnosis not present

## 2017-07-07 LAB — URINALYSIS, ROUTINE W REFLEX MICROSCOPIC
Bilirubin Urine: NEGATIVE
Glucose, UA: NEGATIVE mg/dL
Hgb urine dipstick: NEGATIVE
Ketones, ur: 5 mg/dL — AB
Leukocytes, UA: NEGATIVE
NITRITE: NEGATIVE
PROTEIN: NEGATIVE mg/dL
SPECIFIC GRAVITY, URINE: 1.006 (ref 1.005–1.030)
pH: 7 (ref 5.0–8.0)

## 2017-07-07 MED ORDER — NIFEDIPINE 10 MG PO CAPS
10.0000 mg | ORAL_CAPSULE | ORAL | Status: AC
  Administered 2017-07-07 (×2): 10 mg via ORAL
  Filled 2017-07-07 (×2): qty 1

## 2017-07-07 MED ORDER — ACETAMINOPHEN 500 MG PO TABS
1000.0000 mg | ORAL_TABLET | Freq: Once | ORAL | Status: AC
  Administered 2017-07-07: 1000 mg via ORAL
  Filled 2017-07-07: qty 2

## 2017-07-07 NOTE — MAU Provider Note (Signed)
History     CSN: [id number]  Arrival date and time: 07/07/17 1105   First Provider Initiated Contact with Patient 07/07/17 1200      Chief Complaint  Patient presents with  . Abdominal Pain   Jasmine Conrad is a 31 y.o. G2P0010 at [redacted]w[redacted]d who presents today with abdominal pain since yesterday. She states that it it constant, but at times it is worse. She denies any VB or LOF. She reports normal fetal movement. She was seen here on 11/1 after her dog hitting her in the abdomen. She had negative FFN and cervix closed. However, she was kept for obs because she was contracting on the monitor after a hit to the abdomen. She was not given any medication for tocolysis at that visit. Cervix closed in the office prior to arrival here.   Pelvic US on 09/27/2009: normal Pelvic US on 03/12/2015: several small fibroids around 1cm in size.     Abdominal Pain  This is a new problem. The current episode started yesterday. The problem occurs constantly. The problem has been unchanged. The pain is located in the periumbilical region and suprapubic region. The abdominal pain does not radiate. Associated symptoms include nausea. Pertinent negatives include no constipation (Last BM on 07/05/17, normal to not have one every day. ), diarrhea (diarrhea on 11/4, but none since then. ), dysuria, fever, frequency or vomiting. The pain is aggravated by movement. The pain is relieved by nothing. She has tried nothing for the symptoms.    Past Medical History:  Diagnosis Date  . Medical history non-contributory     Past Surgical History:  Procedure Laterality Date  . WISDOM TOOTH EXTRACTION      Family History  Problem Relation Age of Onset  . Hypertension Paternal Grandmother   . Cancer Paternal Grandmother   . Cancer Paternal Grandfather     Social History   Tobacco Use  . Smoking status: Never Smoker  . Smokeless tobacco: Never Used  Substance Use Topics  . Alcohol use: No    Frequency: Never     Comment: not since pregnancy  . Drug use: No    Allergies: No Known Allergies  Medications Prior to Admission  Medication Sig Dispense Refill Last Dose  . acetaminophen (TYLENOL) 500 MG tablet Take 500 mg by mouth every 6 (six) hours as needed for mild pain or headache.   Past Month at Unknown time  . calcium carbonate (TUMS - DOSED IN MG ELEMENTAL CALCIUM) 500 MG chewable tablet Chew 1 tablet by mouth 2 (two) times daily as needed for indigestion or heartburn.   Past Week at Unknown time  . Omega-3 Fatty Acids (FISH OIL) 875 MG CHEW Chew 1 tablet by mouth daily.   07/06/2017 at Unknown time  . Prenatal Vit-Fe Fumarate-FA (PRENATAL 19) 29-1 MG CHEW Chew 1 tablet by mouth daily.    07/06/2017 at Unknown time    Review of Systems  Constitutional: Negative for chills and fever.  Gastrointestinal: Positive for abdominal pain and nausea. Negative for constipation (Last BM on 07/05/17, normal to not have one every day. ), diarrhea (diarrhea on 11/4, but none since then. ) and vomiting.  Genitourinary: Negative for dysuria, frequency, urgency, vaginal bleeding and vaginal discharge.   Physical Exam   Blood pressure 99/69, pulse 77, temperature 98.2 F (36.8 C), temperature source Oral, resp. rate 16, height 5\' 2"  (1.575 m), weight 143 lb (64.9 kg), SpO2 100 %.  Physical Exam  Nursing note and vitals reviewed.  Constitutional: She is oriented to person, place, and time. She appears well-developed and well-nourished. No distress.  HENT:  Head: Normocephalic.  Cardiovascular: Normal rate.  Respiratory: Effort normal.  GI: Soft. There is no tenderness. There is no rebound.  Neurological: She is alert and oriented to person, place, and time.  Skin: Skin is warm and dry.  Psychiatric: She has a normal mood and affect.   FHT: 145, moderate with 15x15 accels, no decels Toco: some irregular UI.   [place]: Breech, AFI 20.07, normal placentation 2 fibroids: 4.2x3.6x3.4 and 1.6x1.6x1.6  Results for  orders placed or performed during the hospital encounter of 07/07/17 (from the past 24 hour(s))  Urinalysis, Routine w reflex microscopic     Status: Abnormal   Collection Time: 07/07/17 11:26 AM  Result Value Ref Range   Color, Urine YELLOW YELLOW   APPearance CLEAR CLEAR   Specific Gravity, Urine 1.006 1.005 - 1.030   pH 7.0 5.0 - 8.0   Glucose, UA NEGATIVE NEGATIVE mg/dL   Hgb urine dipstick NEGATIVE NEGATIVE   Bilirubin Urine NEGATIVE NEGATIVE   Ketones, ur 5 (A) NEGATIVE mg/dL   Protein, ur NEGATIVE NEGATIVE mg/dL   Nitrite NEGATIVE NEGATIVE   Leukocytes, UA NEGATIVE NEGATIVE    MAU Course  Procedures  MDM Patient has had 3 doses of procardia. She reports that her pain is still the same at this time. FHT: 145, moderate with 15x15 accels, no decels Toco: UI has decreased, but still occasionally presents.   1419: DW Dr. Gaetano Net, he reviewed USs from the office. Last US showed fibroid about 2cm, so there has been some growth there. Pain could be from fibroid growth. Will offer pt pain medication (vicodin or tylenol, her choice). If pain has improved then patient can be DC home.   1604: Patient has had tylenol for her pain. She reports that there has been no change in her pain at this time. Offered narcotic, but she declines at this time. Patient reassured by knowing the potential cause of her pain. She will try tylenol and tylenol PM at home PRN for know. She knows she can call the office if pain worsens and she desires a narcotic for management.   Assessment and Plan   1. Pelvic pain in pregnancy, antepartum, third trimester   2. [redacted] weeks gestation of pregnancy   3. Abdominal pain during pregnancy in third trimester   4. Uterine leiomyoma, unspecified location   5. Braxton Hicks contractions    DC home Comfort measures reviewed  3rd Trimester precautions  PTL precautions  Fetal kick counts RX: no new rx provided today.  Return to MAU as needed FU with OB as  planned  Follow-up Information    Everlene Farrier, MD Follow up.   Specialty:  Obstetrics and Gynecology Contact information: [address] [place] [address] [place] 07/07/2017, 4:04 PM

## 2017-07-07 NOTE — Discharge Instructions (Signed)

## 2017-07-07 NOTE — MAU Note (Signed)
Pt reports really bad sharp abd pain for the last 24 hours, send in office this am and sent over for [facility] and an U/S

## 2017-08-13 DIAGNOSIS — H5213 Myopia, bilateral: Secondary | ICD-10-CM | POA: Diagnosis not present

## 2017-08-17 DIAGNOSIS — Z3685 Encounter for antenatal screening for Streptococcus B: Secondary | ICD-10-CM | POA: Diagnosis not present

## 2017-08-26 DIAGNOSIS — O365930 Maternal care for other known or suspected poor fetal growth, third trimester, not applicable or unspecified: Secondary | ICD-10-CM | POA: Diagnosis not present

## 2017-08-26 DIAGNOSIS — Z3A37 37 weeks gestation of pregnancy: Secondary | ICD-10-CM | POA: Diagnosis not present

## 2017-08-26 DIAGNOSIS — O321XX0 Maternal care for breech presentation, not applicable or unspecified: Secondary | ICD-10-CM | POA: Diagnosis not present

## 2017-08-26 DIAGNOSIS — Z3400 Encounter for supervision of normal first pregnancy, unspecified trimester: Secondary | ICD-10-CM | POA: Diagnosis not present

## 2017-08-27 ENCOUNTER — Telehealth (HOSPITAL_COMMUNITY): Payer: Self-pay | Admitting: *Deleted

## 2017-08-27 NOTE — Telephone Encounter (Signed)
Preadmission screen  

## 2017-08-28 ENCOUNTER — Encounter (HOSPITAL_COMMUNITY): Payer: Self-pay

## 2017-09-08 NOTE — H&P (Signed)
Jasmine Conrad Jasmine Conrad is a 32 y.o. female presenting for primary cesarean section secondary to breech presentation; declines ECV.  Antepartum course uncomplicated.  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 GBS negative.  OB History    Gravida Para Term Preterm AB Living   2       1     SAB TAB Ectopic Multiple Live Births                 Past Medical History:  Diagnosis Date  . Depression    counseling only  . Fibroid   . Medical history non-contributory    Past Surgical History:  Procedure Laterality Date  . WISDOM TOOTH EXTRACTION     Family History: family history includes Cancer in her maternal grandmother, paternal grandfather, and paternal grandmother; Diabetes in her paternal uncle; Hypertension in her paternal grandmother; Kidney Stones in her mother. Social History:  reports that  has never smoked. she has never used smokeless tobacco. She reports that she does not drink alcohol or use drugs.     Maternal Diabetes: No Genetic Screening: Normal Maternal Ultrasounds/Referrals: Normal Fetal Ultrasounds or other Referrals:  None Maternal Substance Abuse:  No Significant Maternal Medications:  None Significant Maternal Lab Results:  Lab values include: Group B Strep negative Other Comments:  None  ROS Maternal Medical History:  Prenatal complications: no prenatal complications Prenatal Complications - Diabetes: none.      There were no vitals taken for this visit. Maternal Exam:  Abdomen: Patient reports no abdominal tenderness. Fundal height is c/w dates.   Estimated fetal weight is 7#8.   Fetal presentation: breech     Physical Exam  Constitutional: She is oriented to person, place, and time. She appears well-developed and well-nourished.  GI: Soft. There is no rebound and no guarding.  Neurological: She is alert and oriented to person, place, and time.  Skin: Skin is warm and dry.  Psychiatric: She has a normal mood and affect. Her behavior is  normal.    Prenatal labs: ABO, Rh: O/Positive/-- (06/07 0000) Antibody: Negative (06/07 0000) Rubella: Immune (06/07 0000) RPR: Nonreactive (06/07 0000)  HBsAg: Negative (06/07 0000)  HIV: Non-reactive (06/07 0000)  GBS:     Assessment/Plan: 32 yo G2P0010 at 39 weeks for primary C/S -Patient has been counseled re: risk of bleeding, infection, scarring, and damage to surrounding structures.  She understands risk of abnormal placentation and uterine rupture in subsequent pregnancies. All questions were answered and the patient wishes to proceed.   Jasmine Conrad, [place] 09/08/2017, 9:38 AM

## 2017-09-08 NOTE — Patient Instructions (Signed)
[facility]  09/08/2017   Your procedure is scheduled on:  09/10/2017  Enter through the Main Entrance of [facility] [place] up the phone at the desk and dial [phone]  Call this number if you have problems the morning of surgery:[phone]  Remember:   Do not eat food:After Midnight.  Do not drink clear liquids: After Midnight.  Take these medicines the morning of surgery with A SIP OF WATER: none   Do not wear jewelry, make-up or nail polish.  Do not wear lotions, powders, or perfumes. Do not wear deodorant.  Do not shave 48 hours prior to surgery.  Do not bring valuables to the hospital.  [facility] is not   responsible for any belongings or valuables brought to the hospital.  Contacts, dentures or bridgework may not be worn into surgery.  Leave suitcase in the car. After surgery it may be brought to your room.  For patients admitted to the hospital, checkout time is 11:00 AM the day of              discharge.    N/A   Please read over the following fact sheets that you were given:   Surgical Site Infection Prevention

## 2017-09-09 ENCOUNTER — Encounter (HOSPITAL_COMMUNITY)
Admission: RE | Admit: 2017-09-09 | Discharge: 2017-09-09 | Disposition: A | Discharge status: Home / Self Care | Payer: 59 | Source: Ambulatory Visit | Attending: Obstetrics & Gynecology | Admitting: Obstetrics & Gynecology

## 2017-09-09 DIAGNOSIS — O321XX0 Maternal care for breech presentation, not applicable or unspecified: Secondary | ICD-10-CM | POA: Diagnosis not present

## 2017-09-09 DIAGNOSIS — Z3A39 39 weeks gestation of pregnancy: Secondary | ICD-10-CM | POA: Diagnosis not present

## 2017-09-09 DIAGNOSIS — D561 Beta thalassemia: Secondary | ICD-10-CM | POA: Diagnosis not present

## 2017-09-09 HISTORY — DX: Benign neoplasm of connective and other soft tissue, unspecified: D21.9

## 2017-09-09 HISTORY — DX: Depression, unspecified: F32.A

## 2017-09-09 HISTORY — DX: Major depressive disorder, single episode, unspecified: F32.9

## 2017-09-09 LAB — CBC
HEMATOCRIT: 36.2 % (ref 36.0–46.0)
Hemoglobin: 12.3 g/dL (ref 12.0–15.0)
MCH: 27.7 pg (ref 26.0–34.0)
MCHC: 34 g/dL (ref 30.0–36.0)
MCV: 81.5 fL (ref 78.0–100.0)
Platelets: 236 10*3/uL (ref 150–400)
RBC: 4.44 MIL/uL (ref 3.87–5.11)
RDW: 13.8 % (ref 11.5–15.5)
WBC: 9.5 10*3/uL (ref 4.0–10.5)

## 2017-09-09 LAB — TYPE AND SCREEN
ABO/RH(D): O POS
ANTIBODY SCREEN: NEGATIVE

## 2017-09-09 LAB — ABO/RH: ABO/RH(D): O POS

## 2017-09-09 NOTE — Anesthesia Preprocedure Evaluation (Signed)
Anesthesia Evaluation  Patient identified by MRN, date of birth, ID band Patient awake    Reviewed: Allergy & Precautions, H&P , Patient's Chart, lab work & pertinent test results, reviewed documented beta blocker date and time   Airway Mallampati: II  TM Distance: >3 FB Neck ROM: full    Dental no notable dental hx.    Pulmonary    Pulmonary exam normal breath sounds clear to auscultation       Cardiovascular  Rhythm:regular Rate:Normal     Neuro/Psych    GI/Hepatic   Endo/Other    Renal/GU      Musculoskeletal   Abdominal   Peds  Hematology   Anesthesia Other Findings   Reproductive/Obstetrics                             Anesthesia Physical Anesthesia Plan  ASA: II  Anesthesia Plan: Spinal   Post-op Pain Management:    Induction:   PONV Risk Score and Plan:   Airway Management Planned:   Additional Equipment:   Intra-op Plan:   Post-operative Plan:   Informed Consent: I have reviewed the patients History and Physical, chart, labs and discussed the procedure including the risks, benefits and alternatives for the proposed anesthesia with the patient or authorized representative who has indicated his/her understanding and acceptance.   Dental Advisory Given  Plan Discussed with: CRNA and Surgeon  Anesthesia Plan Comments: (  )        Anesthesia Quick Evaluation

## 2017-09-10 ENCOUNTER — Encounter (HOSPITAL_COMMUNITY)
Admission: RE | Disposition: A | Discharge status: Home / Self Care | Payer: Self-pay | Source: Ambulatory Visit | Attending: Obstetrics & Gynecology

## 2017-09-10 ENCOUNTER — Inpatient Hospital Stay (HOSPITAL_COMMUNITY): Payer: 59 | Admitting: Anesthesiology

## 2017-09-10 ENCOUNTER — Inpatient Hospital Stay (HOSPITAL_COMMUNITY)
Admission: RE | Admit: 2017-09-10 | Discharge: 2017-09-12 | DRG: 788 | Disposition: A | Discharge status: Home / Self Care | Payer: 59 | Source: Ambulatory Visit | Attending: Obstetrics & Gynecology | Admitting: Obstetrics & Gynecology

## 2017-09-10 ENCOUNTER — Encounter (HOSPITAL_COMMUNITY): Payer: Self-pay | Admitting: *Deleted

## 2017-09-10 DIAGNOSIS — Z3A39 39 weeks gestation of pregnancy: Secondary | ICD-10-CM

## 2017-09-10 DIAGNOSIS — O320XX0 Maternal care for unstable lie, not applicable or unspecified: Secondary | ICD-10-CM | POA: Diagnosis not present

## 2017-09-10 DIAGNOSIS — Z98891 History of uterine scar from previous surgery: Secondary | ICD-10-CM

## 2017-09-10 DIAGNOSIS — O321XX0 Maternal care for breech presentation, not applicable or unspecified: Principal | ICD-10-CM | POA: Diagnosis present

## 2017-09-10 DIAGNOSIS — D561 Beta thalassemia: Secondary | ICD-10-CM | POA: Diagnosis present

## 2017-09-10 LAB — RPR: RPR: NONREACTIVE

## 2017-09-10 SURGERY — Surgical Case
Anesthesia: Spinal

## 2017-09-10 MED ORDER — TETANUS-DIPHTH-ACELL PERTUSSIS 5-2.5-18.5 LF-MCG/0.5 IM SUSP: 0.5000 mL | Freq: Once | INTRAMUSCULAR | Status: DC

## 2017-09-10 MED ORDER — DEXAMETHASONE SODIUM PHOSPHATE 10 MG/ML IJ SOLN
INTRAMUSCULAR | Status: DC | PRN
  Administered 2017-09-10: 10 mg via INTRAVENOUS

## 2017-09-10 MED ORDER — FENTANYL CITRATE (PF) 100 MCG/2ML IJ SOLN
INTRAMUSCULAR | Status: AC
  Filled 2017-09-10: qty 2

## 2017-09-10 MED ORDER — OXYTOCIN 10 UNIT/ML IJ SOLN
INTRAMUSCULAR | Status: AC
  Filled 2017-09-10: qty 4

## 2017-09-10 MED ORDER — PHENYLEPHRINE 8 MG IN D5W 100 ML (0.08MG/ML) PREMIX OPTIME
INJECTION | INTRAVENOUS | Status: DC | PRN
  Administered 2017-09-10: 60 ug/min via INTRAVENOUS

## 2017-09-10 MED ORDER — FENTANYL CITRATE (PF) 100 MCG/2ML IJ SOLN
25.0000 ug | INTRAMUSCULAR | Status: DC | PRN
  Administered 2017-09-10: 50 ug via INTRAVENOUS
  Administered 2017-09-10: 25 ug via INTRAVENOUS

## 2017-09-10 MED ORDER — ACETAMINOPHEN 160 MG/5ML PO SOLN
975.0000 mg | Freq: Once | ORAL | Status: AC
  Administered 2017-09-10: 975 mg via ORAL
  Filled 2017-09-10: qty 40.6

## 2017-09-10 MED ORDER — FENTANYL CITRATE (PF) 100 MCG/2ML IJ SOLN
INTRAMUSCULAR | Status: DC | PRN
  Administered 2017-09-10 (×2): 50 ug via INTRAVENOUS
  Administered 2017-09-10: 5 ug via INTRAVENOUS

## 2017-09-10 MED ORDER — MORPHINE SULFATE (PF) 0.5 MG/ML IJ SOLN
INTRAMUSCULAR | Status: DC | PRN
  Administered 2017-09-10: 100 mg via EPIDURAL

## 2017-09-10 MED ORDER — LACTATED RINGERS IV SOLN
INTRAVENOUS | Status: DC
  Administered 2017-09-10: 16:00:00 via INTRAVENOUS

## 2017-09-10 MED ORDER — CEFAZOLIN SODIUM-DEXTROSE 2-4 GM/100ML-% IV SOLN
2.0000 g | INTRAVENOUS | Status: AC
  Administered 2017-09-10: 2 g via INTRAVENOUS
  Filled 2017-09-10: qty 100

## 2017-09-10 MED ORDER — OXYTOCIN 10 UNIT/ML IJ SOLN
INTRAVENOUS | Status: DC | PRN
  Administered 2017-09-10: 40 [IU] via INTRAVENOUS

## 2017-09-10 MED ORDER — PHENYLEPHRINE HCL 10 MG/ML IJ SOLN
INTRAMUSCULAR | Status: DC | PRN
  Administered 2017-09-10: 80 ug via INTRAVENOUS
  Administered 2017-09-10 (×4): 40 ug via INTRAVENOUS

## 2017-09-10 MED ORDER — DIPHENHYDRAMINE HCL 25 MG PO CAPS: 25.0000 mg | ORAL_CAPSULE | Freq: Four times a day (QID) | ORAL | Status: DC | PRN

## 2017-09-10 MED ORDER — LACTATED RINGERS IV SOLN
INTRAVENOUS | Status: DC | PRN
  Administered 2017-09-10: 08:00:00 via INTRAVENOUS

## 2017-09-10 MED ORDER — WITCH HAZEL-GLYCERIN EX PADS: 1.0000 "application " | MEDICATED_PAD | CUTANEOUS | Status: DC | PRN

## 2017-09-10 MED ORDER — ONDANSETRON HCL 4 MG/2ML IJ SOLN
INTRAMUSCULAR | Status: AC
  Filled 2017-09-10: qty 2

## 2017-09-10 MED ORDER — OXYTOCIN 40 UNITS IN LACTATED RINGERS INFUSION - SIMPLE MED: 2.5000 [IU]/h | INTRAVENOUS | Status: AC

## 2017-09-10 MED ORDER — PHENYLEPHRINE 8 MG IN D5W 100 ML (0.08MG/ML) PREMIX OPTIME
INJECTION | INTRAVENOUS | Status: AC
  Filled 2017-09-10: qty 100

## 2017-09-10 MED ORDER — SIMETHICONE 80 MG PO CHEW
80.0000 mg | CHEWABLE_TABLET | Freq: Three times a day (TID) | ORAL | Status: DC
  Administered 2017-09-10 – 2017-09-12 (×6): 80 mg via ORAL
  Filled 2017-09-10 (×5): qty 1

## 2017-09-10 MED ORDER — SIMETHICONE 80 MG PO CHEW
80.0000 mg | CHEWABLE_TABLET | ORAL | Status: DC
  Administered 2017-09-10 – 2017-09-11 (×2): 80 mg via ORAL
  Filled 2017-09-10 (×2): qty 1

## 2017-09-10 MED ORDER — ACETAMINOPHEN 325 MG PO TABS
650.0000 mg | ORAL_TABLET | ORAL | Status: DC | PRN
  Administered 2017-09-10 – 2017-09-11 (×2): 650 mg via ORAL
  Filled 2017-09-10 (×2): qty 2

## 2017-09-10 MED ORDER — METOCLOPRAMIDE HCL 5 MG/ML IJ SOLN
INTRAMUSCULAR | Status: DC | PRN
  Administered 2017-09-10 (×2): 5 mg via INTRAVENOUS

## 2017-09-10 MED ORDER — SIMETHICONE 80 MG PO CHEW: 80.0000 mg | CHEWABLE_TABLET | ORAL | Status: DC | PRN

## 2017-09-10 MED ORDER — LACTATED RINGERS IV SOLN
INTRAVENOUS | Status: DC
  Administered 2017-09-10 (×3): via INTRAVENOUS

## 2017-09-10 MED ORDER — OXYCODONE-ACETAMINOPHEN 5-325 MG PO TABS
1.0000 | ORAL_TABLET | ORAL | Status: DC | PRN
  Administered 2017-09-11 – 2017-09-12 (×5): 1 via ORAL
  Filled 2017-09-10 (×5): qty 1

## 2017-09-10 MED ORDER — DIBUCAINE 1 % RE OINT: 1.0000 "application " | TOPICAL_OINTMENT | RECTAL | Status: DC | PRN

## 2017-09-10 MED ORDER — OXYCODONE-ACETAMINOPHEN 5-325 MG PO TABS
2.0000 | ORAL_TABLET | ORAL | Status: DC | PRN
  Administered 2017-09-10: 2 via ORAL
  Filled 2017-09-10 (×2): qty 2

## 2017-09-10 MED ORDER — IBUPROFEN 600 MG PO TABS
600.0000 mg | ORAL_TABLET | Freq: Four times a day (QID) | ORAL | Status: DC
  Administered 2017-09-10 – 2017-09-12 (×8): 600 mg via ORAL
  Filled 2017-09-10 (×8): qty 1

## 2017-09-10 MED ORDER — ONDANSETRON HCL 4 MG/2ML IJ SOLN
INTRAMUSCULAR | Status: DC | PRN
  Administered 2017-09-10: 4 mg via INTRAVENOUS

## 2017-09-10 MED ORDER — COCONUT OIL OIL
1.0000 "application " | TOPICAL_OIL | Status: DC | PRN
  Administered 2017-09-11: 1 via TOPICAL
  Filled 2017-09-10: qty 120

## 2017-09-10 MED ORDER — MENTHOL 3 MG MT LOZG: 1.0000 | LOZENGE | OROMUCOSAL | Status: DC | PRN

## 2017-09-10 MED ORDER — PRENATAL MULTIVITAMIN CH
1.0000 | ORAL_TABLET | Freq: Every day | ORAL | Status: DC
  Administered 2017-09-11 – 2017-09-12 (×2): 1 via ORAL
  Filled 2017-09-10 (×2): qty 1

## 2017-09-10 MED ORDER — SENNOSIDES-DOCUSATE SODIUM 8.6-50 MG PO TABS
2.0000 | ORAL_TABLET | ORAL | Status: DC
  Administered 2017-09-10 – 2017-09-11 (×2): 2 via ORAL
  Filled 2017-09-10 (×2): qty 2

## 2017-09-10 MED ORDER — BUPIVACAINE IN DEXTROSE 0.75-8.25 % IT SOLN
INTRATHECAL | Status: DC | PRN
  Administered 2017-09-10: 1.2 mL via INTRATHECAL

## 2017-09-10 MED ORDER — HYDROMORPHONE HCL 1 MG/ML IJ SOLN
0.5000 mg | INTRAMUSCULAR | Status: AC | PRN
  Administered 2017-09-10 (×2): 1 mg via INTRAVENOUS
  Filled 2017-09-10: qty 1
  Filled 2017-09-10: qty 2
  Filled 2017-09-10: qty 1

## 2017-09-10 MED ORDER — MORPHINE SULFATE (PF) 0.5 MG/ML IJ SOLN
INTRAMUSCULAR | Status: AC
  Filled 2017-09-10: qty 10

## 2017-09-10 MED ORDER — ZOLPIDEM TARTRATE 5 MG PO TABS: 5.0000 mg | ORAL_TABLET | Freq: Every evening | ORAL | Status: DC | PRN

## 2017-09-10 SURGICAL SUPPLY — 32 items
BENZOIN TINCTURE PRP APPL 2/3 (GAUZE/BANDAGES/DRESSINGS) ×2 IMPLANT
CHLORAPREP W/TINT 26ML (MISCELLANEOUS) ×2 IMPLANT
CLAMP CORD UMBIL (MISCELLANEOUS) IMPLANT
CLOSURE STERI STRIP 1/2 X4 (GAUZE/BANDAGES/DRESSINGS) ×2 IMPLANT
CLOTH BEACON ORANGE TIMEOUT ST (SAFETY) ×2 IMPLANT
DERMABOND ADVANCED (GAUZE/BANDAGES/DRESSINGS)
DERMABOND ADVANCED .7 DNX12 (GAUZE/BANDAGES/DRESSINGS) IMPLANT
DRSG OPSITE POSTOP 4X10 (GAUZE/BANDAGES/DRESSINGS) ×2 IMPLANT
ELECT REM PT RETURN 9FT ADLT (ELECTROSURGICAL) ×2
ELECTRODE REM PT RTRN 9FT ADLT (ELECTROSURGICAL) ×1 IMPLANT
EXTRACTOR VACUUM KIWI (MISCELLANEOUS) IMPLANT
GLOVE BIO SURGEON STRL SZ 6 (GLOVE) ×2 IMPLANT
GLOVE BIOGEL PI IND STRL 6 (GLOVE) ×2 IMPLANT
GLOVE BIOGEL PI IND STRL 7.0 (GLOVE) ×1 IMPLANT
GLOVE BIOGEL PI INDICATOR 6 (GLOVE) ×2
GLOVE BIOGEL PI INDICATOR 7.0 (GLOVE) ×1
GOWN STRL REUS W/TWL LRG LVL3 (GOWN DISPOSABLE) ×4 IMPLANT
KIT ABG SYR 3ML LUER SLIP (SYRINGE) ×2 IMPLANT
NEEDLE HYPO 25X5/8 SAFETYGLIDE (NEEDLE) ×2 IMPLANT
NS IRRIG 1000ML POUR BTL (IV SOLUTION) ×2 IMPLANT
PACK C SECTION WH (CUSTOM PROCEDURE TRAY) ×2 IMPLANT
PAD OB MATERNITY 4.3X12.25 (PERSONAL CARE ITEMS) ×2 IMPLANT
PENCIL SMOKE EVAC W/HOLSTER (ELECTROSURGICAL) ×2 IMPLANT
STRIP CLOSURE SKIN 1/2X4 (GAUZE/BANDAGES/DRESSINGS) IMPLANT
SUT CHROMIC 0 CTX 36 (SUTURE) ×6 IMPLANT
SUT MON AB 2-0 CT1 27 (SUTURE) ×2 IMPLANT
SUT PDS AB 0 CT1 27 (SUTURE) IMPLANT
SUT PLAIN 0 NONE (SUTURE) IMPLANT
SUT VIC AB 0 CT1 36 (SUTURE) IMPLANT
SUT VIC AB 4-0 KS 27 (SUTURE) IMPLANT
TOWEL OR 17X24 6PK STRL BLUE (TOWEL DISPOSABLE) ×2 IMPLANT
TRAY FOLEY BAG SILVER LF 14FR (SET/KITS/TRAYS/PACK) IMPLANT

## 2017-09-10 NOTE — Anesthesia Procedure Notes (Signed)
Spinal  Patient location during procedure: OB Staffing Anesthesiologist: Lyndle Herrlich, MD Performed: anesthesiologist  Preanesthetic Checklist Completed: patient identified, surgical consent, pre-op evaluation, timeout performed, IV checked, risks and benefits discussed and monitors and equipment checked Spinal Block Patient position: sitting Prep: site prepped and draped and DuraPrep Patient monitoring: heart rate, cardiac monitor, continuous pulse ox and blood pressure Approach: midline Location: L3-4 Injection technique: single-shot Needle Needle type: Sprotte  Needle gauge: 24 G Needle length: 10 cm Assessment Sensory level: T4 Additional Notes Easy SAB Clear CSF

## 2017-09-10 NOTE — Progress Notes (Signed)
Bedside U/S performed and vtx.  Patient was breech on u/s one week ago and by SVE yesterday.  Patient reports feeling the baby turn 15 minutes ago.  Because of unstable lie, the patient wishes to proceed with primary C/S.  Linda Hedges, DO

## 2017-09-10 NOTE — Anesthesia Postprocedure Evaluation (Signed)
Anesthesia Post Note  Patient: Jasmine Conrad  Procedure(s) Performed: CESAREAN SECTION (N/A )     Patient location during evaluation: Mother Baby Anesthesia Type: Spinal Level of consciousness: awake and alert Pain management: pain level controlled Vital Signs Assessment: post-procedure vital signs reviewed and stable Respiratory status: spontaneous breathing Cardiovascular status: stable Postop Assessment: no headache, patient able to bend at knees, no backache, no apparent nausea or vomiting, adequate PO intake and spinal receding Anesthetic complications: no    Last Vitals:  Vitals:   09/10/17 1307 09/10/17 1633  BP: 96/61   Pulse: 66   Resp: 16   Temp: 36.7 C 36.7 C  SpO2:  95%    Last Pain:  Vitals:   09/10/17 1633  TempSrc: Oral  PainSc: 7    Pain Goal: Patients Stated Pain Goal: 0 (09/10/17 0915)               Ailene Ards

## 2017-09-10 NOTE — Anesthesia Postprocedure Evaluation (Signed)
Anesthesia Post Note  Patient: [facility] [place]  Procedure(s) Performed: CESAREAN SECTION (N/A )     Patient location during evaluation: PACU Anesthesia Type: Spinal Level of consciousness: oriented and awake and alert Pain management: pain level controlled Vital Signs Assessment: post-procedure vital signs reviewed and stable Respiratory status: spontaneous breathing and respiratory function stable Cardiovascular status: blood pressure returned to baseline and stable Postop Assessment: no headache, no backache and no apparent nausea or vomiting Anesthetic complications: no    Last Vitals:  Vitals:   09/10/17 0915 09/10/17 0930  BP: 105/69 102/66  Pulse: 73 69  Resp: 16 16  Temp:  36.8 C  SpO2: 99% 97%    Last Pain:  Vitals:   09/10/17 0930  TempSrc: Oral  PainSc: 3    Pain Goal: Patients Stated Pain Goal: 0 (09/10/17 0915)               Lynda Rainwater

## 2017-09-10 NOTE — Transfer of Care (Signed)
Immediate Anesthesia Transfer of Care Note  Patient: Jasmine Conrad  Procedure(s) Performed: CESAREAN SECTION (N/A )  Patient Location: PACU  Anesthesia Type:Spinal  Level of Consciousness: awake  Airway & Oxygen Therapy: Patient Spontanous Breathing  Post-op Assessment: Report given to RN  Post vital signs: Reviewed and stable  Last Vitals:  Vitals:   09/10/17 0606  BP: 109/73  Pulse: 90  Resp: 20  Temp: 36.5 C  SpO2: 100%    Last Pain:  Vitals:   09/10/17 0606  TempSrc: Oral      Patients Stated Pain Goal: 5 (89/48/34 7583)  Complications: No apparent anesthesia complications

## 2017-09-10 NOTE — Op Note (Addendum)
Jasmine [facility] PROCEDURE DATE: 09/10/2017  PREOPERATIVE DIAGNOSIS: Intrauterine pregnancy at  [redacted]w[redacted]d weeks gestation, unstable lie  POSTOPERATIVE DIAGNOSIS: The same  PROCEDURE:  Primary Low Transverse Cesarean Section  SURGEON:  Dr. Linda Hedges  INDICATIONS: Jasmine Conrad is a 32 y.o. G2P0010 at [redacted]w[redacted]d scheduled for cesarean section secondary to unstable lie; patient request. The baby was breech by ultrasound as recent as one week ago and by vaginal exam yesterday.  Ultrasound in preop holding confirmed vertex presentation. Patient was given the option to await labor vs proceeding with C/s and chose the latter. The risks of cesarean section discussed with the patient included but were not limited to: bleeding which may require transfusion or reoperation; infection which may require antibiotics; injury to bowel, bladder, ureters or other surrounding organs; injury to the fetus; need for additional procedures including hysterectomy in the event of a life-threatening hemorrhage; placental abnormalities wth subsequent pregnancies, incisional problems, thromboembolic phenomenon and other postoperative/anesthesia complications. The patient concurred with the proposed plan, giving informed written consent for the procedure.    FINDINGS:  Viable female infant in cephalic presentation, APGARs 9,9: weight pending  Clear amniotic fluid.  Intact placenta, three vessel cord.  Grossly normal uterus, ovaries and fallopian tubes. .   ANESTHESIA:  Spinal ESTIMATED BLOOD LOSS: 500 mL ml SPECIMENS: Placenta sent to L&D COMPLICATIONS: None immediate  PROCEDURE IN DETAIL:  The patient received intravenous antibiotics and had sequential compression devices applied to her lower extremities while in the preoperative area.  She was then taken to the operating room where spinal anesthesia was administered and was found to be adequate. She was then placed in a dorsal supine position with a leftward tilt, and  prepped and draped in a sterile manner.  A foley catheter was placed into her bladder and attached to constant gravity.  After an adequate timeout was performed, a Pfannenstiel skin incision was made with scalpel and carried through to the underlying layer of fascia. The fascia was incised in the midline and this incision was extended bilaterally using the Mayo scissors. Kocher clamps were applied to the superior aspect of the fascial incision and the underlying rectus muscles were dissected off bluntly. A similar process was carried out on the inferior aspect of the facial incision. The rectus muscles were separated in the midline bluntly and the peritoneum was entered bluntly.  Bladder flap was created sharply and developed bluntly.  Bladder blade was placed.  A transverse hysterotomy was made with a scalpel and extended bilaterally bluntly. The bladder blade was then removed. The infant was successfully delivered using a Kiwi vacuum assist (1 pop off), and cord was clamped and cut and infant was handed over to awaiting neonatology team. Uterine massage was then administered and the placenta delivered intact with three-vessel cord. The uterus was cleared of clot and debris.  The hysterotomy was closed with 0 chromic.  A second imbricating suture of 0-chromic was used to reinforce the incision and aid in hemostasis.  The peritoneum and rectus muscles were noted to be hemostatic and were reapproximated using 3-0 monocryl in a running fashion.  The fascia was closed with 0-Vicryl in a running fashion with good restoration of anatomy.  The subcutaneus tissue was copiously irrigated.  The skin was closed with 4-0 vicryl in a subcuticular fashion.  Pt tolerated the procedure will.  All counts were correct x2.  Pt went to the recovery room in stable condition.

## 2017-09-10 NOTE — Lactation Note (Signed)
This note was copied from a baby's chart. Lactation Consultation Note  Patient Name: Jasmine Conrad [id number] Date: 09/10/2017 Reason for consult: Initial assessment;Primapara;1st time breastfeeding  2 hours old 41w 1d female who is being fully BF by his mother at this point. Mom stated that feedings at the breast were comfortable. She was holding baby STS upon arrival to the room. Discussed the importance of STS and how it plays a role in lactogenesis. Explained baby's feeding diary and that babies eat based on cues, not on schedule. Mom was taught how to hand express, also asked her if she wanted to have some privacy from her visitors to demonstrate latch or a feeding but per mom BF is going well and had no further questions or concerns. Mom is aware of BF resources and OP services, encourage her to join the BF support group.  Maternal Data Has patient been taught Hand Expression?: Yes Does the patient have breastfeeding experience prior to this delivery?: No  Feeding Feeding Type: Breast Fed Length of feed: 5 min  Interventions Interventions: Breast feeding basics reviewed;Skin to skin;Breast massage;Breast compression;Hand express  Consult Status Consult Status: Follow-up Date: 09/11/17 Follow-up type: In-patient    Sherrye Puga Francene Boyers 09/10/2017, 1:53 PM

## 2017-09-11 LAB — CBC
HEMATOCRIT: 29.9 % — AB (ref 36.0–46.0)
Hemoglobin: 10.3 g/dL — ABNORMAL LOW (ref 12.0–15.0)
MCH: 28.1 pg (ref 26.0–34.0)
MCHC: 34.4 g/dL (ref 30.0–36.0)
MCV: 81.7 fL (ref 78.0–100.0)
Platelets: 186 10*3/uL (ref 150–400)
RBC: 3.66 MIL/uL — AB (ref 3.87–5.11)
RDW: 13.7 % (ref 11.5–15.5)
WBC: 16 10*3/uL — ABNORMAL HIGH (ref 4.0–10.5)

## 2017-09-11 NOTE — Lactation Note (Signed)
This note was copied from a baby's chart. Lactation Consultation Note  Patient Name: Jasmine Conrad Date: 09/11/2017 Reason for consult: Follow-up assessment  63 hours old 39w 1 d female who is still exclusively BF by his mother. Room was full of visitors again, this time mom had to excuse her visitors to observe feeding at the breast. Breast are intact, no pain or discomfort.Taught mom how to hand express, and she was able to do it herself while baby was nursing. Baby was held skin to skin and fell asleep after 10 minutes, mom is aware of feeding baby on cues and has been doing so. 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  Feeding Feeding Type: Breast Fed Length of feed: 10 min  LATCH Score Latch: Grasps breast easily, tongue down, lips flanged, rhythmical sucking.  Audible Swallowing: A few with stimulation  Type of Nipple: Everted at rest and after stimulation  Comfort (Breast/Nipple): Soft / non-tender  Hold (Positioning): Assistance needed to correctly position infant at breast and maintain latch.  LATCH Score: 8  Interventions Interventions: Breast feeding basics reviewed;Assisted with latch;Skin to skin;Breast massage;Hand express;Breast compression;Adjust position;Support pillows;Position options;Expressed milk;DEBP  Lactation Tools Discussed/Used Tools: Other (comment)(Spoon to collect colostrum)   Consult Status Consult Status: Follow-up Date: 09/12/17 Follow-up type: In-patient    Jasmine Conrad 09/11/2017, 1:20 PM

## 2017-09-11 NOTE — Progress Notes (Signed)
Patient doing well. No complaints  Declines CIRC  BP 97/64 (BP Location: Right Arm)   Pulse 78   Temp 98 F (36.7 C) (Oral)   Resp 16   Ht 5\' 2"  (1.575 m)   Wt 68.7 kg (151 lb 8 oz)   SpO2 95%   Breastfeeding? Unknown   BMI 27.71 kg/m  Results for orders placed or performed during the hospital encounter of 09/10/17 (from the past 24 hour(s))  CBC     Status: Abnormal   Collection Time: 09/11/17  5:23 AM  Result Value Ref Range   WBC 16.0 (H) 4.0 - 10.5 K/uL   RBC 3.66 (L) 3.87 - 5.11 MIL/uL   Hemoglobin 10.3 (L) 12.0 - 15.0 g/dL   HCT 29.9 (L) 36.0 - 46.0 %   MCV 81.7 78.0 - 100.0 fL   MCH 28.1 26.0 - 34.0 pg   MCHC 34.4 30.0 - 36.0 g/dL   RDW 13.7 11.5 - 15.5 %   Platelets 186 150 - 400 K/uL   Abdomen is soft and non tender Bandage dry  Doing well Routine care Discharge home tomorrow

## 2017-09-12 MED ORDER — IBUPROFEN 600 MG PO TABS: 600.0000 mg | ORAL_TABLET | Freq: Four times a day (QID) | ORAL | 0 refills | Status: DC | PRN

## 2017-09-12 MED ORDER — OXYCODONE-ACETAMINOPHEN 5-325 MG PO TABS: 1.0000 | ORAL_TABLET | ORAL | 0 refills | Status: DC | PRN

## 2017-09-12 NOTE — Addendum Note (Signed)
Addendum  created 09/12/17 1051 by Audry Pili, MD   Sign clinical note

## 2017-09-12 NOTE — Lactation Note (Signed)
This note was copied from a baby's chart. Lactation Consultation Note  Patient Name: Jasmine Conrad NATFT'D Date: 09/12/2017 Reason for consult: Follow-up assessment;Infant weight loss(8% weight loss )  Baby is 79 hours old  LC reviewed and updated the doc flow sheets per mom .  Baby hungry and LC assisted mom to latch breast / see latch scores in the doc flow sheets.  LC assisted to work on depth and easing chin down for depth.  LC recommended starting out with the cross cradle to the cradle.  Breast compressions with latch , and intermittent.  Sore nipple and engorgement prevention and tx.  LC instructed mom on the use of hand pump and shells.  Mother informed of post-discharge support and given phone number to the lactation department, including services for phone call assistance; out-patient appointments; and breastfeeding support group. List of other breastfeeding resources in the community given in the handout. Encouraged mother to call for problems or concerns related to breastfeeding.   Maternal Data Has patient been taught Hand Expression?: Yes  Feeding Feeding Type: (recently latched ) Length of feed: 10 min(per mom )  LATCH Score                   Interventions Interventions: Breast feeding basics reviewed  Lactation Tools Discussed/Used Tools: Pump;Shells Shell Type: Inverted Breast pump type: Manual WIC Program: No Pump Review: Setup, frequency, and cleaning;Milk Storage Initiated by:: MAI  Date initiated:: 09/12/17   Consult Status Consult Status: Follow-up Date: 09/12/17 Follow-up type: In-patient    [place] 09/12/2017, 9:45 AM

## 2017-09-12 NOTE — Consult Note (Signed)
Called by RN to evaluate patient complaining of headache, now POD#2 from C/S under spinal anesthetic. Reviewed spinal procedure note, standard equipment used, no difficulties noted. Upon entering patient's room, patient was ambulating in the room. She sat upon the bed, upright, and remained in that position throughout our interaction. She described the pain as more intense than headaches she's experienced in the past, encompassing her entire head, throbbing in nature. She says that the headache, if anything, feels better when she is upright. It is exacerbated by breast-feeding. Educated patient on spinal headaches and feel very confident that this headache is unrelated to the spinal performed for her C/S. Patient satisfied. Instructed to continue adequate fluid intake, encouraged tylenol, ibuprofen, and caffeine intake as well. Patient expecting to be discharged home today. No further follow up required.\  Renold Don, MD Anesthesiology

## 2017-09-12 NOTE — Discharge Summary (Signed)
Obstetric Discharge Summary Reason for Admission: cesarean section Prenatal Procedures: none Intrapartum Procedures: cesarean: low cervical, transverse Postpartum Procedures: none Complications-Operative and Postpartum: none Hemoglobin  Date Value Ref Range Status  09/11/2017 10.3 (L) 12.0 - 15.0 g/dL Final   HCT  Date Value Ref Range Status  09/11/2017 29.9 (L) 36.0 - 46.0 % Final    Physical Exam:  General: alert, cooperative and appears stated age 32: appropriate Uterine Fundus: firm Incision: healing well, no significant drainage, no dehiscence DVT Evaluation: No evidence of DVT seen on physical exam.  Discharge Diagnoses: Term Pregnancy-delivered  Discharge Information: Date: 09/12/2017 Activity: pelvic rest Diet: routine Medications: Ibuprofen and Percocet Condition: improved Instructions: refer to practice specific booklet Discharge to: home   Newborn Data: Live born female  Birth Weight: 6 lb 9.8 oz (3000 g) APGAR: 9, 9  Newborn Delivery   Birth date/time:  09/10/2017 07:53:00 Delivery type:  C-Section, Vacuum Assisted C-section categorization:  Primary     Home with mother.  Jasmine Conrad L 09/12/2017, 8:45 AM

## 2017-10-19 DIAGNOSIS — Z1389 Encounter for screening for other disorder: Secondary | ICD-10-CM | POA: Diagnosis not present

## 2017-10-26 DIAGNOSIS — Z0000 Encounter for general adult medical examination without abnormal findings: Secondary | ICD-10-CM | POA: Diagnosis not present

## 2017-10-28 DIAGNOSIS — Z0000 Encounter for general adult medical examination without abnormal findings: Secondary | ICD-10-CM | POA: Diagnosis not present

## 2017-10-28 DIAGNOSIS — Z6822 Body mass index (BMI) 22.0-22.9, adult: Secondary | ICD-10-CM | POA: Diagnosis not present

## 2017-12-22 IMAGING — DX DG CHEST 2V
2 series · 2 of 2 positions shown · non-contrast
Comparison: None.

CLINICAL DATA: Fever.  Right sided rales.

EXAM:
CHEST  2 VIEW

[chest pa]
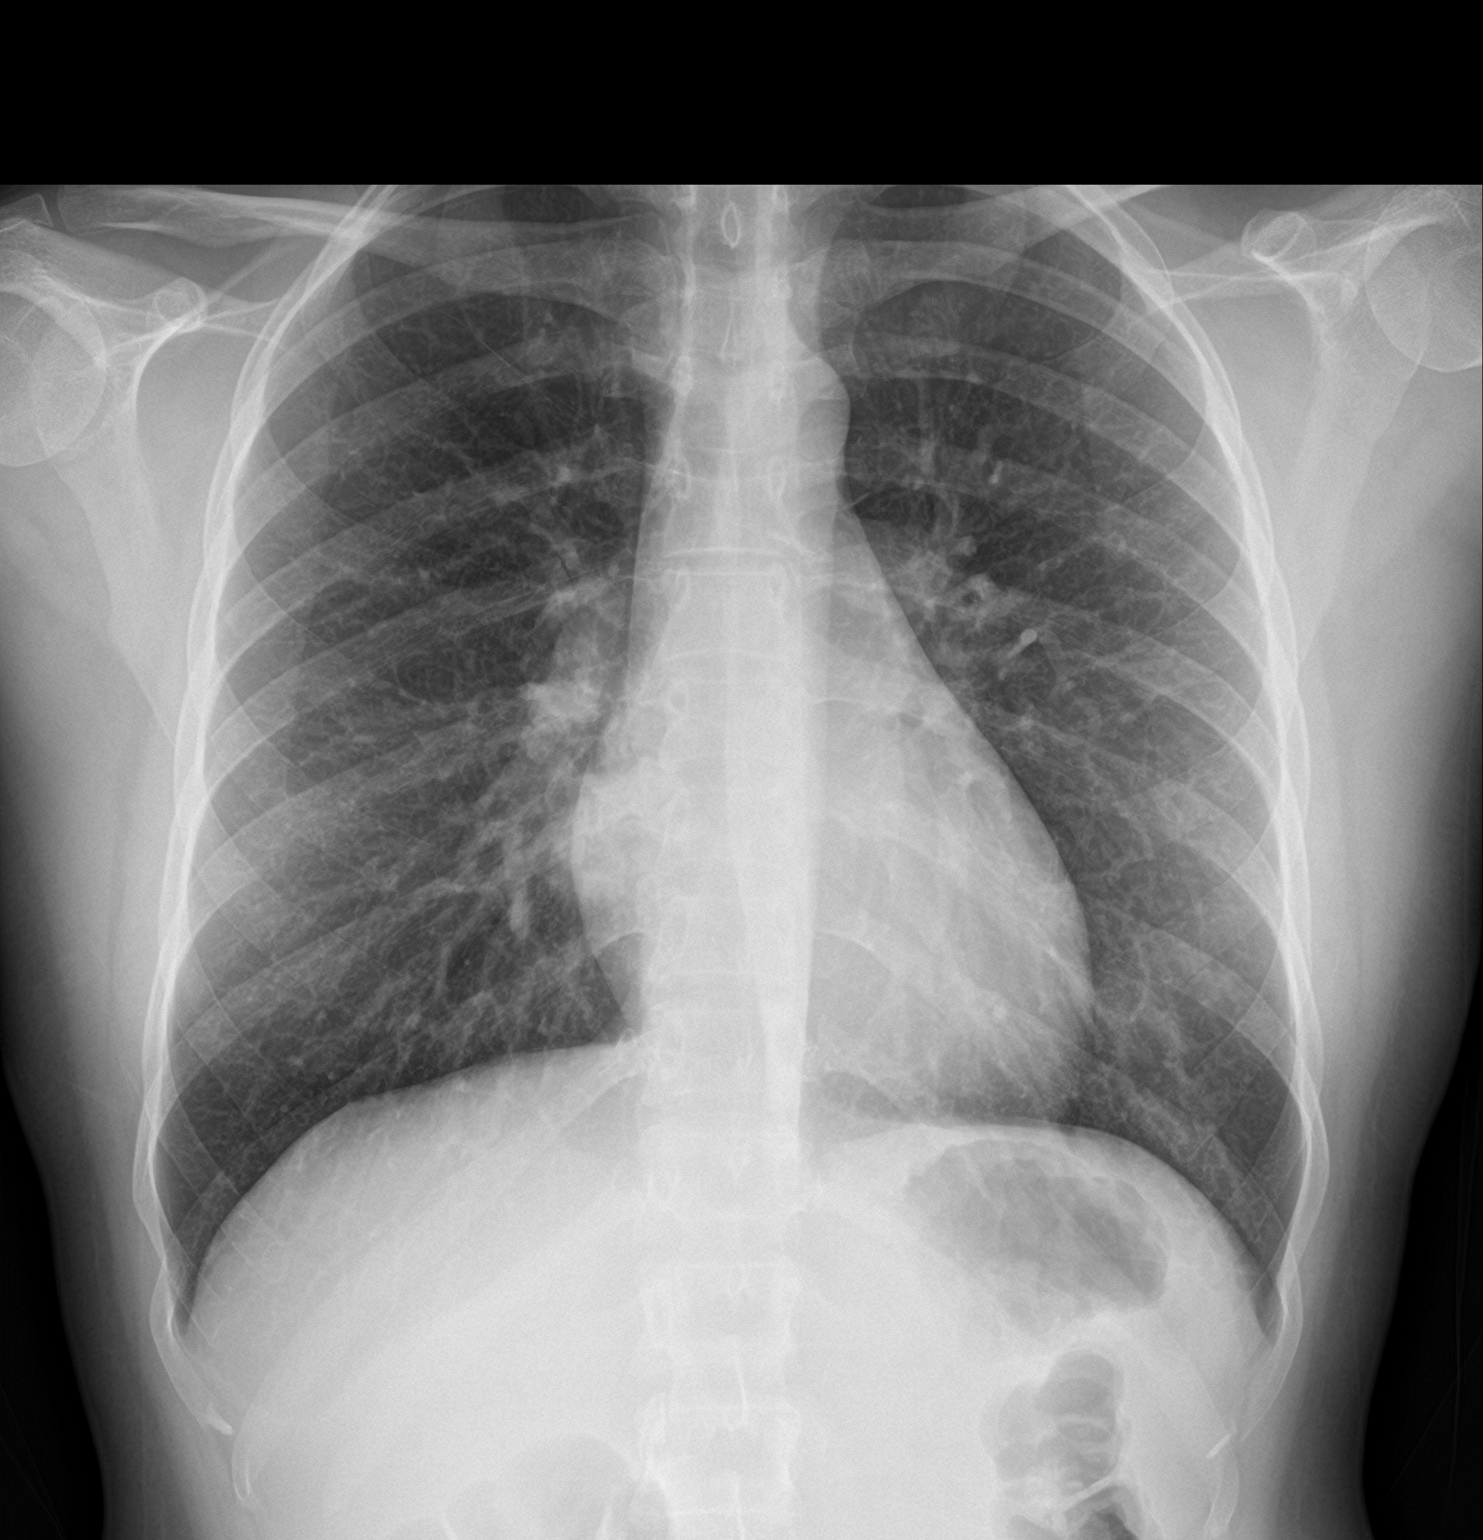

[chest lat]
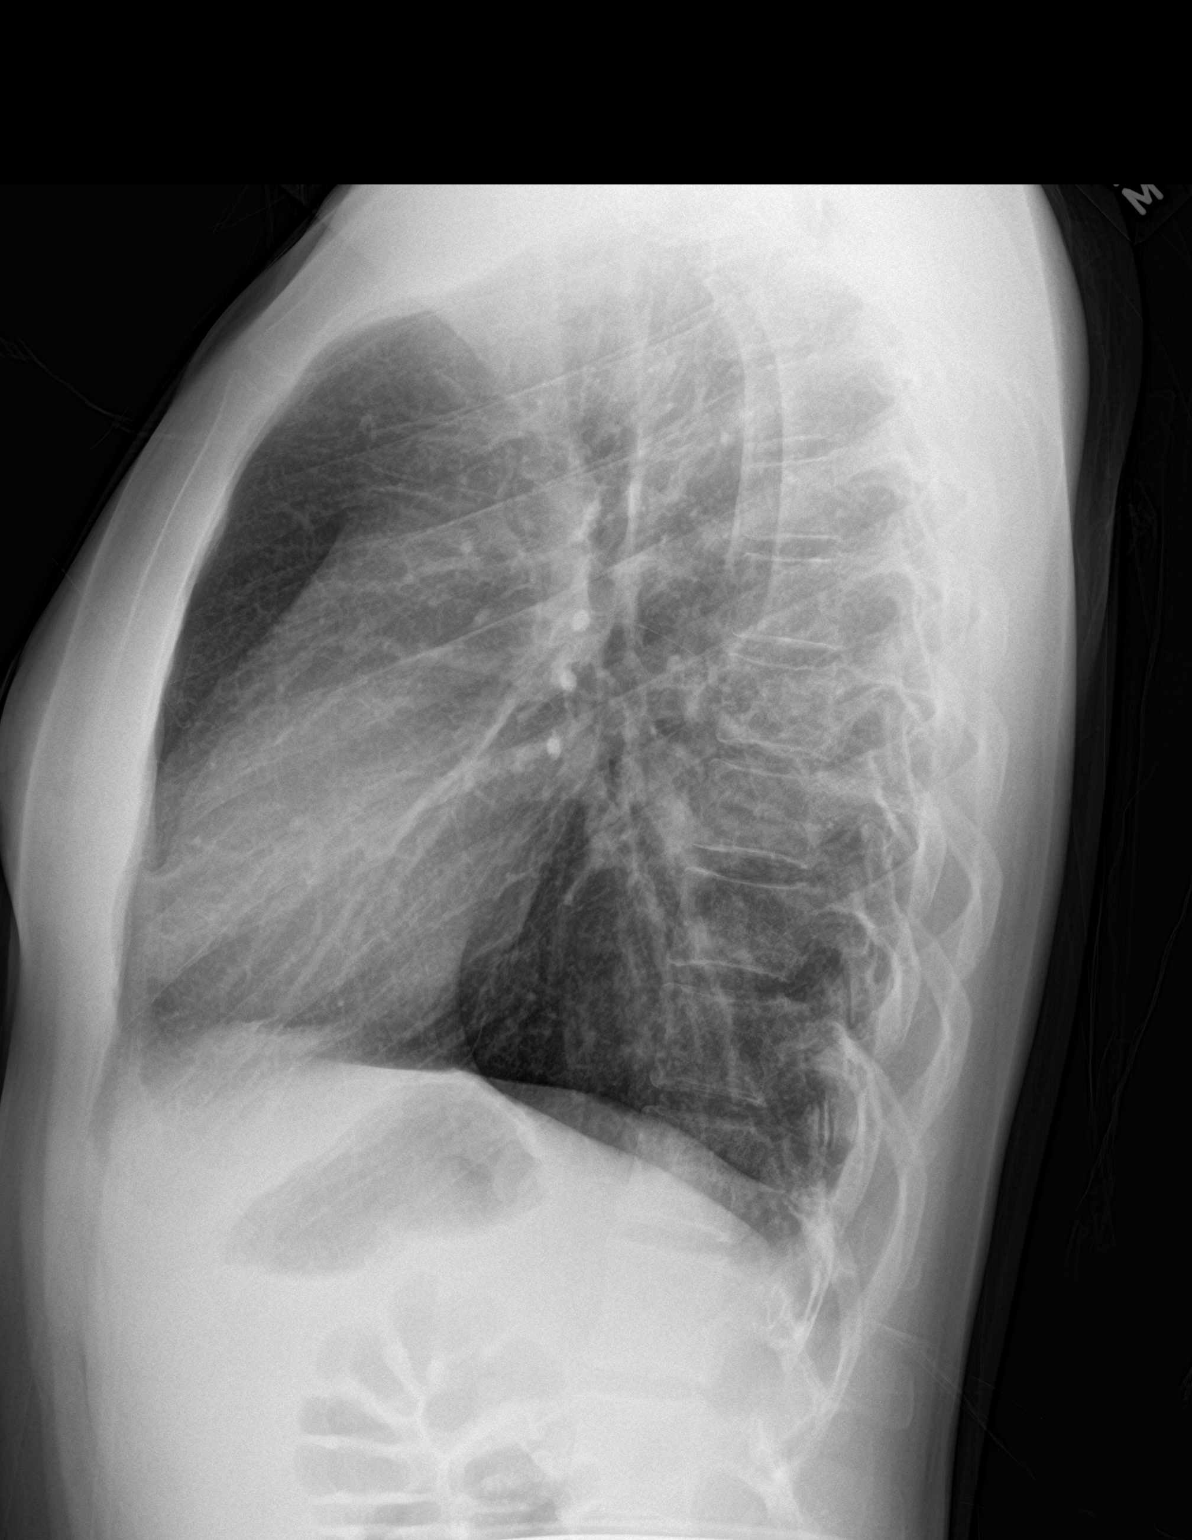

[2 of 2 positions shown; findings below may reference images not displayed]

FINDINGS: Normal heart size. Normal mediastinal contour. No pneumothorax. No
pleural effusion. Mild patchy opacity in the central right lower
lobe. Otherwise clear lungs.
IMPRESSION: Mild patchy central right lower lobe opacity, which could represent
a mild pneumonia. Recommend follow-up PA and lateral post treatment
chest radiographs in 4-6 weeks.

## 2018-03-05 DIAGNOSIS — J069 Acute upper respiratory infection, unspecified: Secondary | ICD-10-CM | POA: Diagnosis not present

## 2018-03-05 DIAGNOSIS — Z6822 Body mass index (BMI) 22.0-22.9, adult: Secondary | ICD-10-CM | POA: Diagnosis not present

## 2018-04-01 DIAGNOSIS — J029 Acute pharyngitis, unspecified: Secondary | ICD-10-CM | POA: Diagnosis not present

## 2018-04-01 DIAGNOSIS — J069 Acute upper respiratory infection, unspecified: Secondary | ICD-10-CM | POA: Diagnosis not present

## 2018-04-01 DIAGNOSIS — Z6821 Body mass index (BMI) 21.0-21.9, adult: Secondary | ICD-10-CM | POA: Diagnosis not present

## 2018-08-30 DIAGNOSIS — H52223 Regular astigmatism, bilateral: Secondary | ICD-10-CM | POA: Diagnosis not present

## 2018-08-30 DIAGNOSIS — H5213 Myopia, bilateral: Secondary | ICD-10-CM | POA: Diagnosis not present

## 2018-09-27 ENCOUNTER — Encounter: Payer: Self-pay | Admitting: Physician Assistant

## 2018-09-27 ENCOUNTER — Telehealth: Payer: 59 | Admitting: Physician Assistant

## 2018-09-27 DIAGNOSIS — Z20828 Contact with and (suspected) exposure to other viral communicable diseases: Secondary | ICD-10-CM

## 2018-09-27 DIAGNOSIS — R6889 Other general symptoms and signs: Secondary | ICD-10-CM | POA: Diagnosis not present

## 2018-09-27 MED ORDER — OSELTAMIVIR PHOSPHATE 75 MG PO CAPS: 75.0000 mg | ORAL_CAPSULE | Freq: Two times a day (BID) | ORAL | 0 refills | Status: AC

## 2018-09-27 MED FILL — OSELTAMIVIR PHOSPHATE 75 MG: 75 | 5 days supply | Qty: 10 | Fill #0

## 2018-09-27 NOTE — Progress Notes (Addendum)
E visit for Flu like symptoms   We are sorry that you are not feeling well.  Here is how we plan to help! Based on what you have shared with me it looks like you may have a respiratory virus that may be influenza.  Influenza or "the flu" is   an infection caused by a respiratory virus. The flu virus is highly contagious and persons who did not receive their yearly flu vaccination may "catch" the flu from close contact.  We have anti-viral medications to treat the viruses that cause this infection. They are not a "cure" and only shorten the course of the infection. These prescriptions are most effective when they are given within the first 2 days of "flu" symptoms. Antiviral medication are indicated if you have a high risk of complications from the flu. You should  also consider an antiviral medication if you are in close contact with someone who is at risk. These medications can help patients avoid complications from the flu  but have side effects that you should know. Possible side effects from Tamiflu or oseltamivir include nausea, vomiting, diarrhea, dizziness, headaches, eye redness, sleep problems or other respiratory symptoms. You should not take Tamiflu if you have an allergy to oseltamivir or any to the ingredients in Tamiflu.  Based upon your symptoms and potential risk factors I have prescribed Oseltamivir (Tamiflu).  It has been sent to your designated pharmacy.  You will take one 75 mg capsule orally twice a day for the next 5 days.     ANYONE WHO HAS FLU SYMPTOMS SHOULD: . Stay home. The flu is highly contagious and going out or to work exposes others! . Be sure to drink plenty of fluids. Water is fine as well as fruit juices, sodas and electrolyte beverages. You may want to stay away from caffeine or alcohol. If you are nauseated, try taking small sips of liquids. How do you know if you are getting enough fluid? Your urine should be a pale yellow or almost colorless. . Get  rest. . Taking a steamy shower or using a humidifier may help nasal congestion and ease sore throat pain. Using a saline nasal spray works much the same way. . Cough drops, hard candies and sore throat lozenges may ease your cough. . Line up a caregiver. Have someone check on you regularly.   GET HELP RIGHT AWAY IF: . You cannot keep down liquids or your medications. . You become short of breath . Your fell like you are going to pass out or loose consciousness. . Your symptoms persist after you have completed your treatment plan MAKE SURE YOU   Understand these instructions.  Will watch your condition.  Will get help right away if you are not doing well or get worse.  Your e-visit answers were reviewed by a board certified advanced clinical practitioner to complete your personal care plan.  Depending on the condition, your plan could have included both over the counter or prescription medications.  If there is a problem please reply  once you have received a response from your provider.  Your safety is important to [place].  If you have drug allergies check your prescription carefully.    You can use MyChart to ask questions about today's visit, request a non-urgent call back, or ask for a work or school excuse for 24 hours related to this e-Visit. If it has been greater than 24 hours you will need to follow up with your provider, or enter  a new e-Visit to address those concerns.  You will get an e-mail in the next two days asking about your experience.  I hope that your e-visit has been valuable and will speed your recovery. Thank you for using e-visits.  I have spent 7 minutes in review of this chart- Lacy Duverney [facility]

## 2018-10-01 DIAGNOSIS — J069 Acute upper respiratory infection, unspecified: Secondary | ICD-10-CM | POA: Diagnosis not present

## 2018-10-01 DIAGNOSIS — Z6821 Body mass index (BMI) 21.0-21.9, adult: Secondary | ICD-10-CM | POA: Diagnosis not present

## 2018-10-11 ENCOUNTER — Encounter (INDEPENDENT_AMBULATORY_CARE_PROVIDER_SITE_OTHER): Payer: Self-pay | Admitting: Physician Assistant

## 2018-10-11 ENCOUNTER — Ambulatory Visit
Admission: EM | Admit: 2018-10-11 | Discharge: 2018-10-11 | Disposition: A | Discharge status: Home / Self Care | Payer: 59 | Attending: Family Medicine | Admitting: Family Medicine

## 2018-10-11 DIAGNOSIS — J0100 Acute maxillary sinusitis, unspecified: Secondary | ICD-10-CM

## 2018-10-11 DIAGNOSIS — G9331 Postviral fatigue syndrome: Secondary | ICD-10-CM

## 2018-10-11 DIAGNOSIS — Z0000 Encounter for general adult medical examination without abnormal findings: Secondary | ICD-10-CM

## 2018-10-11 DIAGNOSIS — G933 Postviral fatigue syndrome: Secondary | ICD-10-CM

## 2018-10-11 MED ORDER — TERCONAZOLE 0.4 % VA CREA: 1.0000 | TOPICAL_CREAM | Freq: Every day | VAGINAL | 0 refills | Status: DC

## 2018-10-11 MED ORDER — AMOXICILLIN-POT CLAVULANATE 875-125 MG PO TABS: 1.0000 | ORAL_TABLET | Freq: Two times a day (BID) | ORAL | 0 refills | Status: DC

## 2018-10-11 MED FILL — TERCONAZOLE 0.4% VAG CREAM: 0.4 | 15 days supply | Qty: 45 | Fill #0

## 2018-10-11 MED FILL — AMOX-CLAV 875-125 MG TABLET: 875-125 | 7 days supply | Qty: 14 | Fill #0

## 2018-10-11 NOTE — Discharge Instructions (Signed)
Push fluids Use a humidifier if you have one You may take an oral decongestant Give the nasal spray another try Take antibiotic 2 times a day Follow-up if not better in a week

## 2018-10-11 NOTE — ED Triage Notes (Signed)
Pt c/o nasal congestion x2 weeks, states completed tamiflu last week

## 2018-10-11 NOTE — ED Provider Notes (Signed)
[facility]    CSN: [id number] Arrival date & time: 10/11/18  1436     History   Chief Complaint Chief Complaint  Patient presents with  . Nasal Congestion    HPI Jasmine Conrad is a 33 y.o. female.   HPI  Patient states that she has been sick for about 2 weeks.  Initially she had sudden onset of body aches and fever with respiratory symptoms.  She was diagnosed with influenza.  She took Tamiflu for 5 days.  She felt better after the Tamiflu, but then started feeling sick again with nasal congestion.  Went to her primary care doctor.  Was treated with an unknown nasal spray.  This made her feel worse.  She is now 2 weeks into the illness.  Yellow sinus drainage, sinus pressure and pain, sore throat.  She cannot breathe through her nose.  She feels very tired.  Past Medical History:  Diagnosis Date  . Depression    counseling only  . Fibroid   . Medical history non-contributory     Patient Active Problem List   Diagnosis Date Noted  . S/P cesarean section 09/10/2017  . Trauma during pregnancy 07/03/2017  . Left shoulder pain 04/01/2016    Past Surgical History:  Procedure Laterality Date  . CESAREAN SECTION N/A 09/10/2017   Procedure: CESAREAN SECTION;  Surgeon: Linda Hedges, DO;  Location: [place];  Service: Obstetrics;  Laterality: N/A;  Primary edc 09/16/17 NKDA [name] RNFA  . WISDOM TOOTH EXTRACTION      OB History    Gravida  2   Para  1   Term  1   Preterm      AB  1   Living  1     SAB      TAB      Ectopic      Multiple  0   Live Births  1            Home Medications    Prior to Admission medications   Medication Sig Start Date End Date Taking? Authorizing Provider  acetaminophen (TYLENOL) 500 MG tablet Take 500 mg by mouth every 6 (six) hours as needed for mild pain or headache.    [provider]  amoxicillin-clavulanate (AUGMENTIN) 875-125 MG tablet Take 1 tablet by mouth every 12  (twelve) hours. 10/11/18   Raylene Everts, MD  calcium carbonate (TUMS - DOSED IN MG ELEMENTAL CALCIUM) 500 MG chewable tablet Chew 1-2 tablets by mouth 2 (two) times daily as needed for indigestion or heartburn.     [provider]  ibuprofen (ADVIL,MOTRIN) 600 MG tablet Take 1 tablet (600 mg total) by mouth every 6 (six) hours as needed. 09/12/17   Dian Queen, MD  Omega-3 Fatty Acids (FISH OIL) 875 MG CHEW Chew 875 mg by mouth daily.     [provider]  Prenatal Vit-Fe Fumarate-FA (PRENATAL 19) 29-1 MG CHEW Chew 1 tablet by mouth daily.     [provider]  Probiotic Product (PROBIOTIC PO) Take 1 capsule by mouth daily.    [provider]  terconazole (TERAZOL 7) 0.4 % vaginal cream Place 1 applicator vaginally at bedtime. 10/11/18   Raylene Everts, MD    Family History Family History  Problem Relation Age of Onset  . Hypertension Paternal Grandmother   . Cancer Paternal Grandmother   . Cancer Paternal Grandfather   . Cancer Maternal Grandmother   . Kidney Stones Mother   .  Diabetes Paternal Uncle     Social History Social History   Tobacco Use  . Smoking status: Never Smoker  . Smokeless tobacco: Never Used  Substance Use Topics  . Alcohol use: No    Frequency: Never    Comment: not since pregnancy  . Drug use: No     Allergies   Patient has no known allergies.   Review of Systems Review of Systems  Constitutional: Positive for fatigue. Negative for chills and fever.  HENT: Positive for congestion, postnasal drip, rhinorrhea and sinus pain. Negative for ear pain and sore throat.   Eyes: Negative for pain and visual disturbance.  Respiratory: Positive for cough. Negative for shortness of breath.   Cardiovascular: Negative for chest pain and palpitations.  Gastrointestinal: Negative for abdominal pain and vomiting.  Genitourinary: Negative for dysuria and hematuria.  Musculoskeletal: Negative for arthralgias and back  pain.  Skin: Negative for color change and rash.  Neurological: Negative for seizures and syncope.  All other systems reviewed and are negative.    Physical Exam Triage Vital Signs ED Triage Vitals [10/11/18 1454]  Enc Vitals Group     BP 105/75     Pulse Rate 94     Resp 18     Temp 99 F (37.2 C)     Temp Source Oral     SpO2 98 %     Weight      Height      Head Circumference      Peak Flow      Pain Score 4     Pain Loc      Pain Edu?      Excl. in [place]?    No data found.  Updated Vital Signs BP 105/75 (BP Location: Left Arm)   Pulse 94   Temp 99 F (37.2 C) (Oral)   Resp 18   LMP 09/28/2018   SpO2 98%       Physical Exam Constitutional:      General: She is not in acute distress.    Appearance: She is well-developed and normal weight. She is ill-appearing.  HENT:     Head: Normocephalic and atraumatic.     Right Ear: Tympanic membrane, ear canal and external ear normal.     Left Ear: Tympanic membrane, ear canal and external ear normal.     Nose: Congestion and rhinorrhea present.     Mouth/Throat:     Pharynx: Posterior oropharyngeal erythema present.  Eyes:     Conjunctiva/sclera: Conjunctivae normal.     Pupils: Pupils are equal, round, and reactive to light.  Neck:     Musculoskeletal: Normal range of motion.  Cardiovascular:     Rate and Rhythm: Normal rate and regular rhythm.     Heart sounds: Normal heart sounds.  Pulmonary:     Effort: Pulmonary effort is normal. No respiratory distress.     Breath sounds: Normal breath sounds.  Abdominal:     General: There is no distension.     Palpations: Abdomen is soft.  Musculoskeletal: Normal range of motion.  Lymphadenopathy:     Cervical: Cervical adenopathy present.  Skin:    General: Skin is warm and dry.  Neurological:     Mental Status: She is alert.  Psychiatric:        Mood and Affect: Mood normal.        Thought Content: Thought content normal.      UC Treatments / Results    Labs (all  labs ordered are listed, but only abnormal results are displayed) Labs Reviewed - No data to display  EKG None  Radiology No results found.  Procedures Procedures (including critical care time)  Medications Ordered in UC Medications - No data to display  Initial Impression / Assessment and Plan / UC Course  I have reviewed the triage vital signs and the nursing notes.  Pertinent labs & imaging results that were available during my care of the patient were reviewed by me and considered in my medical decision making (see chart for details).     Patient has been sick for 2-week after presumed influenza.  She has sinus pressure and pain, purulent drainage, and feels like she has a sinus infection.  She has been treated with Tamiflu and over-the-counter medicines.  I believe a course of antibiotics is appropriate at this time.  Discussed symptomatic care. Final Clinical Impressions(s) / UC Diagnoses   Final diagnoses:  Acute non-recurrent maxillary sinusitis  Post-influenza syndrome     Discharge Instructions     Push fluids Use a humidifier if you have one You may take an oral decongestant Give the nasal spray another try Take antibiotic 2 times a day Follow-up if not better in a week    ED Prescriptions    Medication Sig Dispense Auth. Provider   amoxicillin-clavulanate (AUGMENTIN) 875-125 MG tablet Take 1 tablet by mouth every 12 (twelve) hours. 14 tablet Raylene Everts, MD   terconazole (TERAZOL 7) 0.4 % vaginal cream Place 1 applicator vaginally at bedtime. 45 g Raylene Everts, MD     Controlled Substance Prescriptions [place] Controlled Substance Registry consulted? Not Applicable   Raylene Everts, MD 10/11/18 [place]

## 2018-10-11 NOTE — Progress Notes (Signed)
Opened in Error.  This encounter was created in error - please disregard.

## 2018-10-29 DIAGNOSIS — Z0000 Encounter for general adult medical examination without abnormal findings: Secondary | ICD-10-CM | POA: Diagnosis not present

## 2018-11-03 DIAGNOSIS — Z0000 Encounter for general adult medical examination without abnormal findings: Secondary | ICD-10-CM | POA: Diagnosis not present

## 2018-11-03 DIAGNOSIS — Z6821 Body mass index (BMI) 21.0-21.9, adult: Secondary | ICD-10-CM | POA: Diagnosis not present

## 2018-11-21 ENCOUNTER — Telehealth: Payer: 59 | Admitting: Family

## 2018-11-21 DIAGNOSIS — R6889 Other general symptoms and signs: Secondary | ICD-10-CM

## 2018-11-21 NOTE — Progress Notes (Signed)
E-Visit for Corona Virus Screening  Based on your current symptoms, you may very well have the virus, however your symptoms are mild. Currently, not all patients are being tested. If the symptoms are mild and there is not a known exposure, performing the test is not indicated.   As discussed on the phone, since you believe your blurred vision is related to your contacts we will continue to monitor this. If it worsens or you have any changes with your walking, talking, or speech you will go to the ED.   Approximately 5 minutes was spent documenting and reviewing patient's chart.   Coronavirus disease 2019 (COVID-19) is a respiratory illness that can spread from person to person. The virus that causes COVID-19 is a new virus that was first identified in the country of [place] but is now found in multiple other countries and has spread to the [place].  Symptoms associated with the virus are mild to severe fever, cough, and shortness of breath. There is currently no vaccine to protect against COVID-19, and there is no specific antiviral treatment for the virus.   To be considered HIGH RISK for Coronavirus (COVID-19), you have to meet the following criteria:  . Traveled to [place], [place], [place], [place] or [place]; or in the [place] to [place], [place], or [place]; and have fever, cough, and shortness of breath within the last 2 weeks of travel OR  . Been in close contact with a person diagnosed with COVID-19 within the last 2 weeks and have fever, cough, and shortness of breath  . IF YOU DO NOT MEET THESE CRITERIA, YOU ARE CONSIDERED LOW RISK FOR COVID-19.   It is vitally important that if you feel that you have an infection such as this virus or any other virus that you stay home and away from places where you may spread it to others.  You should self-quarantine for 14 days if you have symptoms that could potentially be coronavirus and avoid contact with people age 88 and  older.   You can use medication such as A prescription cough medication called Tessalon Perles 100 mg. You may take 1-2 capsules every 8 hours as needed for cough.   You may also take acetaminophen (Tylenol) as needed for fever.   Reduce your risk of any infection by using the same precautions used for avoiding the common cold or flu:  Marland Kitchen Wash your hands often with soap and warm water for at least 20 seconds.  If soap and water are not readily available, use an alcohol-based hand sanitizer with at least 60% alcohol.  . If coughing or sneezing, cover your mouth and nose by coughing or sneezing into the elbow areas of your shirt or coat, into a tissue or into your sleeve (not your hands). . Avoid shaking hands with others and consider head nods or verbal greetings only. . Avoid touching your eyes, nose, or mouth with unwashed hands.  . Avoid close contact with people who are sick. . Avoid places or events with large numbers of people in one location, like concerts or sporting events. . Carefully consider travel plans you have or are making. . If you are planning any travel outside or inside the [place], visit the CDC's Travelers' Health webpage for the latest health notices. . If you have some symptoms but not all symptoms, continue to monitor at home and seek medical attention if your symptoms worsen. . If you are having a medical emergency,  call 911.  HOME CARE . Only take medications as instructed by your medical team. . Drink plenty of fluids and get plenty of rest. . A steam or ultrasonic humidifier can help if you have congestion.   GET HELP RIGHT AWAY IF: . You develop worsening fever. . You become short of breath . You cough up blood. . Your symptoms become more severe MAKE SURE YOU   Understand these instructions.  Will watch your condition.  Will get help right away if you are not doing well or get worse.  Your e-visit answers were reviewed by a board certified advanced  clinical practitioner to complete your personal care plan.  Depending on the condition, your plan could have included both over the counter or prescription medications.  If there is a problem please reply once you have received a response from your provider. Your safety is important to [place].  If you have drug allergies check your prescription carefully.    You can use MyChart to ask questions about today's visit, request a non-urgent call back, or ask for a work or school excuse for 24 hours related to this e-Visit. If it has been greater than 24 hours you will need to follow up with your provider, or enter a new e-Visit to address those concerns. You will get an e-mail in the next two days asking about your experience.  I hope that your e-visit has been valuable and will speed your recovery. Thank you for using e-visits.

## 2019-01-21 DIAGNOSIS — N911 Secondary amenorrhea: Secondary | ICD-10-CM | POA: Diagnosis not present

## 2019-01-27 DIAGNOSIS — Z3685 Encounter for antenatal screening for Streptococcus B: Secondary | ICD-10-CM | POA: Diagnosis not present

## 2019-01-27 DIAGNOSIS — Z3481 Encounter for supervision of other normal pregnancy, first trimester: Secondary | ICD-10-CM | POA: Diagnosis not present

## 2019-01-27 LAB — OB RESULTS CONSOLE RUBELLA ANTIBODY, IGM: Rubella: IMMUNE

## 2019-01-27 LAB — OB RESULTS CONSOLE ABO/RH: RH Type: POSITIVE

## 2019-01-27 LAB — OB RESULTS CONSOLE HIV ANTIBODY (ROUTINE TESTING): HIV: NONREACTIVE

## 2019-01-27 LAB — OB RESULTS CONSOLE GC/CHLAMYDIA
Chlamydia: NEGATIVE
Gonorrhea: NEGATIVE

## 2019-01-27 LAB — OB RESULTS CONSOLE HEPATITIS B SURFACE ANTIGEN: Hepatitis B Surface Ag: NEGATIVE

## 2019-01-27 LAB — OB RESULTS CONSOLE ANTIBODY SCREEN: Antibody Screen: NEGATIVE

## 2019-01-27 LAB — OB RESULTS CONSOLE RPR: RPR: NONREACTIVE

## 2019-02-16 DIAGNOSIS — Z3400 Encounter for supervision of normal first pregnancy, unspecified trimester: Secondary | ICD-10-CM | POA: Diagnosis not present

## 2019-02-16 DIAGNOSIS — Z113 Encounter for screening for infections with a predominantly sexual mode of transmission: Secondary | ICD-10-CM | POA: Diagnosis not present

## 2019-02-28 DIAGNOSIS — Z3A27 27 weeks gestation of pregnancy: Secondary | ICD-10-CM | POA: Diagnosis not present

## 2019-02-28 DIAGNOSIS — Z3682 Encounter for antenatal screening for nuchal translucency: Secondary | ICD-10-CM | POA: Diagnosis not present

## 2019-04-13 DIAGNOSIS — Z3400 Encounter for supervision of normal first pregnancy, unspecified trimester: Secondary | ICD-10-CM | POA: Diagnosis not present

## 2019-04-13 DIAGNOSIS — Z361 Encounter for antenatal screening for raised alphafetoprotein level: Secondary | ICD-10-CM | POA: Diagnosis not present

## 2019-04-13 DIAGNOSIS — Z363 Encounter for antenatal screening for malformations: Secondary | ICD-10-CM | POA: Diagnosis not present

## 2019-04-13 DIAGNOSIS — Z3A18 18 weeks gestation of pregnancy: Secondary | ICD-10-CM | POA: Diagnosis not present

## 2019-06-15 DIAGNOSIS — Z3480 Encounter for supervision of other normal pregnancy, unspecified trimester: Secondary | ICD-10-CM | POA: Diagnosis not present

## 2019-06-15 DIAGNOSIS — Z23 Encounter for immunization: Secondary | ICD-10-CM | POA: Diagnosis not present

## 2019-06-15 DIAGNOSIS — Z3400 Encounter for supervision of normal first pregnancy, unspecified trimester: Secondary | ICD-10-CM | POA: Diagnosis not present

## 2019-07-21 DIAGNOSIS — Z3483 Encounter for supervision of other normal pregnancy, third trimester: Secondary | ICD-10-CM | POA: Diagnosis not present

## 2019-07-21 DIAGNOSIS — Z3482 Encounter for supervision of other normal pregnancy, second trimester: Secondary | ICD-10-CM | POA: Diagnosis not present

## 2019-08-17 DIAGNOSIS — Z3685 Encounter for antenatal screening for Streptococcus B: Secondary | ICD-10-CM | POA: Diagnosis not present

## 2019-08-17 LAB — OB RESULTS CONSOLE GBS: GBS: POSITIVE

## 2019-08-22 ENCOUNTER — Encounter (HOSPITAL_COMMUNITY): Payer: Self-pay | Admitting: *Deleted

## 2019-08-22 ENCOUNTER — Encounter (HOSPITAL_COMMUNITY): Payer: Self-pay

## 2019-08-22 NOTE — Patient Instructions (Signed)
Jasmine Conrad [facility]  08/22/2019   Your procedure is scheduled on:  09/01/2019  Arrive at 40 at Entrance C on [address] at [facility]  and [employer]. You are invited to use the FREE valet parking or use the Visitor's parking deck.  Pick up the phone at the desk and dial [phone].  Call this number if you have problems the morning of surgery: [phone]  Remember:   Do not eat food:(After Midnight) Desps de medianoche.  Do not drink clear liquids: (After Midnight) Desps de medianoche.  Take these medicines the morning of surgery with A SIP OF WATER:  none   Do not wear jewelry, make-up or nail polish.  Do not wear lotions, powders, or perfumes. Do not wear deodorant.  Do not shave 48 hours prior to surgery.  Do not bring valuables to the hospital.  [facility] is not   responsible for any belongings or valuables brought to the hospital.  Contacts, dentures or bridgework may not be worn into surgery.  Leave suitcase in the car. After surgery it may be brought to your room.  For patients admitted to the hospital, checkout time is 11:00 AM the day of              discharge.      Please read over the following fact sheets that you were given:     Preparing for Surgery

## 2019-08-29 ENCOUNTER — Other Ambulatory Visit: Payer: Self-pay

## 2019-08-29 ENCOUNTER — Encounter (HOSPITAL_COMMUNITY): Payer: Self-pay | Admitting: Obstetrics and Gynecology

## 2019-08-29 ENCOUNTER — Inpatient Hospital Stay (HOSPITAL_COMMUNITY): Payer: 59 | Admitting: Anesthesiology

## 2019-08-29 ENCOUNTER — Inpatient Hospital Stay (HOSPITAL_COMMUNITY)
Admission: AD | Admit: 2019-08-29 | Discharge: 2019-08-31 | DRG: 788 | Disposition: A | Discharge status: Home / Self Care | Payer: 59 | Attending: Obstetrics & Gynecology | Admitting: Obstetrics & Gynecology

## 2019-08-29 ENCOUNTER — Encounter (HOSPITAL_COMMUNITY)
Admission: AD | Disposition: A | Discharge status: Home / Self Care | Payer: Self-pay | Source: Home / Self Care | Attending: Obstetrics & Gynecology

## 2019-08-29 DIAGNOSIS — Z3A38 38 weeks gestation of pregnancy: Secondary | ICD-10-CM

## 2019-08-29 DIAGNOSIS — O321XX0 Maternal care for breech presentation, not applicable or unspecified: Secondary | ICD-10-CM | POA: Diagnosis present

## 2019-08-29 DIAGNOSIS — Z20828 Contact with and (suspected) exposure to other viral communicable diseases: Secondary | ICD-10-CM | POA: Diagnosis present

## 2019-08-29 DIAGNOSIS — D649 Anemia, unspecified: Secondary | ICD-10-CM | POA: Diagnosis present

## 2019-08-29 DIAGNOSIS — O4292 Full-term premature rupture of membranes, unspecified as to length of time between rupture and onset of labor: Secondary | ICD-10-CM | POA: Diagnosis present

## 2019-08-29 DIAGNOSIS — O9902 Anemia complicating childbirth: Secondary | ICD-10-CM | POA: Diagnosis present

## 2019-08-29 DIAGNOSIS — O99824 Streptococcus B carrier state complicating childbirth: Secondary | ICD-10-CM | POA: Diagnosis present

## 2019-08-29 DIAGNOSIS — O34211 Maternal care for low transverse scar from previous cesarean delivery: Secondary | ICD-10-CM | POA: Diagnosis present

## 2019-08-29 DIAGNOSIS — Z98891 History of uterine scar from previous surgery: Secondary | ICD-10-CM

## 2019-08-29 LAB — CBC
HCT: 35.2 % — ABNORMAL LOW (ref 36.0–46.0)
Hemoglobin: 11.9 g/dL — ABNORMAL LOW (ref 12.0–15.0)
MCH: 27.9 pg (ref 26.0–34.0)
MCHC: 33.8 g/dL (ref 30.0–36.0)
MCV: 82.6 fL (ref 80.0–100.0)
Platelets: 245 10*3/uL (ref 150–400)
RBC: 4.26 MIL/uL (ref 3.87–5.11)
RDW: 13.5 % (ref 11.5–15.5)
WBC: 10.3 10*3/uL (ref 4.0–10.5)
nRBC: 0 % (ref 0.0–0.2)

## 2019-08-29 LAB — TYPE AND SCREEN
ABO/RH(D): O POS
Antibody Screen: NEGATIVE

## 2019-08-29 LAB — RESPIRATORY PANEL BY RT PCR (FLU A&B, COVID)
Influenza A by PCR: NEGATIVE
Influenza B by PCR: NEGATIVE
SARS Coronavirus 2 by RT PCR: NEGATIVE

## 2019-08-29 LAB — RPR: RPR Ser Ql: NONREACTIVE

## 2019-08-29 LAB — POCT FERN TEST: POCT Fern Test: POSITIVE

## 2019-08-29 LAB — ABO/RH: ABO/RH(D): O POS

## 2019-08-29 SURGERY — Surgical Case
Anesthesia: Spinal

## 2019-08-29 MED ORDER — NALOXONE HCL 0.4 MG/ML IJ SOLN: 0.4000 mg | INTRAMUSCULAR | Status: DC | PRN

## 2019-08-29 MED ORDER — SODIUM CHLORIDE 0.9 % IV SOLN: INTRAVENOUS | Status: DC | PRN

## 2019-08-29 MED ORDER — SOD CITRATE-CITRIC ACID 500-334 MG/5ML PO SOLN
30.0000 mL | Freq: Once | ORAL | Status: AC
  Administered 2019-08-29: 30 mL via ORAL
  Filled 2019-08-29: qty 30

## 2019-08-29 MED ORDER — ACETAMINOPHEN 325 MG PO TABS
650.0000 mg | ORAL_TABLET | ORAL | Status: DC | PRN
  Administered 2019-08-31: 650 mg via ORAL
  Filled 2019-08-29: qty 2

## 2019-08-29 MED ORDER — CEFAZOLIN SODIUM-DEXTROSE 2-4 GM/100ML-% IV SOLN: 2.0000 g | INTRAVENOUS | Status: DC

## 2019-08-29 MED ORDER — DIBUCAINE (PERIANAL) 1 % EX OINT: 1.0000 "application " | TOPICAL_OINTMENT | CUTANEOUS | Status: DC | PRN

## 2019-08-29 MED ORDER — PROMETHAZINE HCL 25 MG/ML IJ SOLN: 6.2500 mg | INTRAMUSCULAR | Status: DC | PRN

## 2019-08-29 MED ORDER — HYDROMORPHONE HCL 1 MG/ML IJ SOLN: 0.2500 mg | INTRAMUSCULAR | Status: DC | PRN

## 2019-08-29 MED ORDER — ZOLPIDEM TARTRATE 5 MG PO TABS: 5.0000 mg | ORAL_TABLET | Freq: Every evening | ORAL | Status: DC | PRN

## 2019-08-29 MED ORDER — BUPIVACAINE IN DEXTROSE 0.75-8.25 % IT SOLN
INTRATHECAL | Status: DC | PRN
  Administered 2019-08-29: 10.5 mg via INTRATHECAL

## 2019-08-29 MED ORDER — KETOROLAC TROMETHAMINE 30 MG/ML IJ SOLN
30.0000 mg | Freq: Four times a day (QID) | INTRAMUSCULAR | Status: AC | PRN
  Administered 2019-08-29 – 2019-08-30 (×2): 30 mg via INTRAVENOUS
  Filled 2019-08-29 (×2): qty 1

## 2019-08-29 MED ORDER — PHENYLEPHRINE HCL-NACL 20-0.9 MG/250ML-% IV SOLN
INTRAVENOUS | Status: DC | PRN
  Administered 2019-08-29: 60 ug/min via INTRAVENOUS

## 2019-08-29 MED ORDER — SIMETHICONE 80 MG PO CHEW
80.0000 mg | CHEWABLE_TABLET | ORAL | Status: DC
  Administered 2019-08-29 – 2019-08-31 (×2): 80 mg via ORAL
  Filled 2019-08-29 (×2): qty 1

## 2019-08-29 MED ORDER — LACTATED RINGERS IV SOLN: INTRAVENOUS | Status: DC

## 2019-08-29 MED ORDER — FENTANYL CITRATE (PF) 100 MCG/2ML IJ SOLN
INTRAMUSCULAR | Status: DC | PRN
  Administered 2019-08-29: 15 ug via INTRATHECAL

## 2019-08-29 MED ORDER — WITCH HAZEL-GLYCERIN EX PADS: 1.0000 "application " | MEDICATED_PAD | CUTANEOUS | Status: DC | PRN

## 2019-08-29 MED ORDER — MEPERIDINE HCL 25 MG/ML IJ SOLN: 6.2500 mg | INTRAMUSCULAR | Status: DC | PRN

## 2019-08-29 MED ORDER — DIPHENHYDRAMINE HCL 25 MG PO CAPS: 25.0000 mg | ORAL_CAPSULE | ORAL | Status: DC | PRN

## 2019-08-29 MED ORDER — OXYTOCIN 40 UNITS IN NORMAL SALINE INFUSION - SIMPLE MED: 2.5000 [IU]/h | INTRAVENOUS | Status: AC

## 2019-08-29 MED ORDER — NALBUPHINE HCL 10 MG/ML IJ SOLN: 5.0000 mg | INTRAMUSCULAR | Status: DC | PRN

## 2019-08-29 MED ORDER — NALBUPHINE HCL 10 MG/ML IJ SOLN: 5.0000 mg | Freq: Once | INTRAMUSCULAR | Status: DC | PRN

## 2019-08-29 MED ORDER — SODIUM CHLORIDE 0.9 % IV SOLN
5.0000 10*6.[IU] | Freq: Once | INTRAVENOUS | Status: AC
  Administered 2019-08-29: 5 10*6.[IU] via INTRAVENOUS
  Filled 2019-08-29: qty 5

## 2019-08-29 MED ORDER — DIPHENHYDRAMINE HCL 50 MG/ML IJ SOLN: 12.5000 mg | INTRAMUSCULAR | Status: DC | PRN

## 2019-08-29 MED ORDER — SENNOSIDES-DOCUSATE SODIUM 8.6-50 MG PO TABS
2.0000 | ORAL_TABLET | ORAL | Status: DC
  Administered 2019-08-29 – 2019-08-31 (×2): 2 via ORAL
  Filled 2019-08-29 (×2): qty 2

## 2019-08-29 MED ORDER — OXYTOCIN 40 UNITS IN NORMAL SALINE INFUSION - SIMPLE MED
INTRAVENOUS | Status: DC | PRN
  Administered 2019-08-29: 40 mL via INTRAVENOUS

## 2019-08-29 MED ORDER — ONDANSETRON HCL 4 MG/2ML IJ SOLN: 4.0000 mg | Freq: Three times a day (TID) | INTRAMUSCULAR | Status: DC | PRN

## 2019-08-29 MED ORDER — CEFAZOLIN SODIUM-DEXTROSE 2-3 GM-%(50ML) IV SOLR
INTRAVENOUS | Status: DC | PRN
  Administered 2019-08-29: 2 g via INTRAVENOUS

## 2019-08-29 MED ORDER — NALOXONE HCL 4 MG/10ML IJ SOLN
1.0000 ug/kg/h | INTRAVENOUS | Status: DC | PRN
  Filled 2019-08-29: qty 5

## 2019-08-29 MED ORDER — FENTANYL CITRATE (PF) 100 MCG/2ML IJ SOLN
INTRAMUSCULAR | Status: DC | PRN
  Administered 2019-08-29: 25 ug via INTRAVENOUS
  Administered 2019-08-29: 60 ug via INTRAVENOUS

## 2019-08-29 MED ORDER — OXYCODONE-ACETAMINOPHEN 5-325 MG PO TABS
1.0000 | ORAL_TABLET | ORAL | Status: DC | PRN
  Administered 2019-08-30 (×3): 1 via ORAL
  Filled 2019-08-29 (×3): qty 1

## 2019-08-29 MED ORDER — SODIUM CHLORIDE 0.9 % IR SOLN
Status: DC | PRN
  Administered 2019-08-29 (×2): 1

## 2019-08-29 MED ORDER — SODIUM CHLORIDE 0.9% FLUSH: 3.0000 mL | INTRAVENOUS | Status: DC | PRN

## 2019-08-29 MED ORDER — MORPHINE SULFATE (PF) 0.5 MG/ML IJ SOLN
INTRAMUSCULAR | Status: DC | PRN
  Administered 2019-08-29: .15 mg via INTRATHECAL

## 2019-08-29 MED ORDER — SCOPOLAMINE 1 MG/3DAYS TD PT72
MEDICATED_PATCH | TRANSDERMAL | Status: AC
  Filled 2019-08-29: qty 1

## 2019-08-29 MED ORDER — ONDANSETRON HCL 4 MG/2ML IJ SOLN
INTRAMUSCULAR | Status: DC | PRN
  Administered 2019-08-29: 4 mg via INTRAVENOUS

## 2019-08-29 MED ORDER — ACETAMINOPHEN 500 MG PO TABS
1000.0000 mg | ORAL_TABLET | Freq: Four times a day (QID) | ORAL | Status: AC
  Administered 2019-08-29 – 2019-08-30 (×4): 1000 mg via ORAL
  Filled 2019-08-29 (×4): qty 2

## 2019-08-29 MED ORDER — PENICILLIN G POT IN DEXTROSE 60000 UNIT/ML IV SOLN
3.0000 10*6.[IU] | INTRAVENOUS | Status: DC
  Filled 2019-08-29: qty 50

## 2019-08-29 MED ORDER — IBUPROFEN 800 MG PO TABS
800.0000 mg | ORAL_TABLET | Freq: Four times a day (QID) | ORAL | Status: DC | PRN
  Administered 2019-08-30 – 2019-08-31 (×4): 800 mg via ORAL
  Filled 2019-08-29 (×4): qty 1

## 2019-08-29 MED ORDER — KETOROLAC TROMETHAMINE 30 MG/ML IJ SOLN
30.0000 mg | Freq: Four times a day (QID) | INTRAMUSCULAR | Status: AC | PRN
  Administered 2019-08-29: 30 mg via INTRAMUSCULAR

## 2019-08-29 MED ORDER — SIMETHICONE 80 MG PO CHEW
80.0000 mg | CHEWABLE_TABLET | Freq: Three times a day (TID) | ORAL | Status: DC
  Administered 2019-08-29 – 2019-08-31 (×6): 80 mg via ORAL
  Filled 2019-08-29 (×6): qty 1

## 2019-08-29 MED ORDER — KETOROLAC TROMETHAMINE 30 MG/ML IJ SOLN: 30.0000 mg | Freq: Once | INTRAMUSCULAR | Status: DC | PRN

## 2019-08-29 MED ORDER — SIMETHICONE 80 MG PO CHEW: 80.0000 mg | CHEWABLE_TABLET | ORAL | Status: DC | PRN

## 2019-08-29 MED ORDER — SCOPOLAMINE 1 MG/3DAYS TD PT72
1.0000 | MEDICATED_PATCH | Freq: Once | TRANSDERMAL | Status: DC
  Administered 2019-08-29: 1.5 mg via TRANSDERMAL

## 2019-08-29 MED ORDER — MENTHOL 3 MG MT LOZG: 1.0000 | LOZENGE | OROMUCOSAL | Status: DC | PRN

## 2019-08-29 MED ORDER — FAMOTIDINE IN NACL 20-0.9 MG/50ML-% IV SOLN
20.0000 mg | Freq: Once | INTRAVENOUS | Status: AC
  Administered 2019-08-29: 20 mg via INTRAVENOUS
  Filled 2019-08-29: qty 50

## 2019-08-29 MED ORDER — LACTATED RINGERS IV SOLN: INTRAVENOUS | Status: DC | PRN

## 2019-08-29 MED ORDER — PRENATAL MULTIVITAMIN CH
1.0000 | ORAL_TABLET | Freq: Every day | ORAL | Status: DC
  Administered 2019-08-29 – 2019-08-30 (×2): 1 via ORAL
  Filled 2019-08-29 (×2): qty 1

## 2019-08-29 MED ORDER — KETOROLAC TROMETHAMINE 30 MG/ML IJ SOLN
INTRAMUSCULAR | Status: AC
  Filled 2019-08-29: qty 1

## 2019-08-29 MED ORDER — COCONUT OIL OIL: 1.0000 "application " | TOPICAL_OIL | Status: DC | PRN

## 2019-08-29 MED ORDER — TETANUS-DIPHTH-ACELL PERTUSSIS 5-2.5-18.5 LF-MCG/0.5 IM SUSP: 0.5000 mL | Freq: Once | INTRAMUSCULAR | Status: DC

## 2019-08-29 SURGICAL SUPPLY — 33 items
BENZOIN TINCTURE PRP APPL 2/3 (GAUZE/BANDAGES/DRESSINGS) ×2 IMPLANT
CHLORAPREP W/TINT 26ML (MISCELLANEOUS) ×2 IMPLANT
CLAMP CORD UMBIL (MISCELLANEOUS) IMPLANT
CLOSURE STERI STRIP 1/2 X4 (GAUZE/BANDAGES/DRESSINGS) ×2 IMPLANT
CLOTH BEACON ORANGE TIMEOUT ST (SAFETY) ×2 IMPLANT
DERMABOND ADVANCED (GAUZE/BANDAGES/DRESSINGS)
DERMABOND ADVANCED .7 DNX12 (GAUZE/BANDAGES/DRESSINGS) IMPLANT
DRSG OPSITE POSTOP 4X10 (GAUZE/BANDAGES/DRESSINGS) ×2 IMPLANT
ELECT REM PT RETURN 9FT ADLT (ELECTROSURGICAL) ×2
ELECTRODE REM PT RTRN 9FT ADLT (ELECTROSURGICAL) ×1 IMPLANT
EXTRACTOR VACUUM KIWI (MISCELLANEOUS) IMPLANT
GLOVE BIO SURGEON STRL SZ 6 (GLOVE) ×2 IMPLANT
GLOVE BIOGEL PI IND STRL 6 (GLOVE) ×2 IMPLANT
GLOVE BIOGEL PI IND STRL 7.0 (GLOVE) ×1 IMPLANT
GLOVE BIOGEL PI INDICATOR 6 (GLOVE) ×2
GLOVE BIOGEL PI INDICATOR 7.0 (GLOVE) ×1
GOWN STRL REUS W/TWL LRG LVL3 (GOWN DISPOSABLE) ×4 IMPLANT
KIT ABG SYR 3ML LUER SLIP (SYRINGE) ×2 IMPLANT
NEEDLE HYPO 25X5/8 SAFETYGLIDE (NEEDLE) ×2 IMPLANT
NS IRRIG 1000ML POUR BTL (IV SOLUTION) ×2 IMPLANT
PACK C SECTION WH (CUSTOM PROCEDURE TRAY) ×2 IMPLANT
PAD OB MATERNITY 4.3X12.25 (PERSONAL CARE ITEMS) ×2 IMPLANT
PENCIL SMOKE EVAC W/HOLSTER (ELECTROSURGICAL) ×2 IMPLANT
STRIP CLOSURE SKIN 1/2X4 (GAUZE/BANDAGES/DRESSINGS) IMPLANT
SUT CHROMIC 0 CTX 36 (SUTURE) ×6 IMPLANT
SUT MON AB 2-0 CT1 27 (SUTURE) ×2 IMPLANT
SUT PDS AB 0 CT1 27 (SUTURE) IMPLANT
SUT PLAIN 0 NONE (SUTURE) IMPLANT
SUT VIC AB 0 CT1 36 (SUTURE) IMPLANT
SUT VIC AB 4-0 KS 27 (SUTURE) IMPLANT
TOWEL OR 17X24 6PK STRL BLUE (TOWEL DISPOSABLE) ×2 IMPLANT
TRAY FOLEY W/BAG SLVR 14FR LF (SET/KITS/TRAYS/PACK) IMPLANT
WATER STERILE IRR 1000ML POUR (IV SOLUTION) ×2 IMPLANT

## 2019-08-29 NOTE — Progress Notes (Signed)
MOB was referred for history of depression.  * Referral screened out by Clinical Social Worker because none of the following criteria appear to apply:  ~ History of anxiety/depression during this pregnancy, or of post-partum depression following prior delivery. ~ Diagnosis of anxiety and/or depression within last 3 years. Per [facility] records, MOB has history of depression from years ago that was treated with counseling and no history of PPD. No concerns noted in [facility] records.  OR * MOB's symptoms currently being treated with medication and/or therapy.  Please contact the Clinical Social Worker if needs arise, by [facility] request, or if MOB scores greater than 9/yes to question 10 on [place] Postpartum Depression Screen.  Elijio Miles, LCSW Women[facility] and [employer] [phone]

## 2019-08-29 NOTE — MAU Note (Signed)
Patient presents to MAU c/o ROM clear fluid at 0330.  +fm denies vaginal bleeding.  Patient has RCS 12/31.

## 2019-08-29 NOTE — Transfer of Care (Addendum)
Immediate Anesthesia Transfer of Care Note  Patient: Jasmine Conrad [facility]  Procedure(s) Performed: CESAREAN SECTION (N/A )  Patient Location: PACU  Anesthesia Type:Spinal  Level of Consciousness: awake, alert  and oriented  Airway & Oxygen Therapy: Patient Spontanous Breathing  Post-op Assessment: Report given to RN and Post -op Vital signs reviewed and stable  Post vital signs: Reviewed and stable  Last Vitals:  Vitals Value Taken Time  BP 118/92 08/29/19 1018  Temp    Pulse 86 08/29/19 1021  Resp 15 08/29/19 1021  SpO2 99 % 08/29/19 1021  Vitals shown include unvalidated device data.  Last Pain:  Vitals:   08/29/19 0546  TempSrc: Oral         Complications: No apparent anesthesia complications

## 2019-08-29 NOTE — Anesthesia Postprocedure Evaluation (Signed)
Anesthesia Post Note  Patient: Jasmine Conrad  Procedure(s) Performed: CESAREAN SECTION (N/A )     Patient location during evaluation: PACU Anesthesia Type: Spinal Level of consciousness: awake Pain management: pain level controlled Vital Signs Assessment: post-procedure vital signs reviewed and stable Respiratory status: spontaneous breathing Cardiovascular status: stable Postop Assessment: no headache, no backache, spinal receding, patient able to bend at knees and no apparent nausea or vomiting Anesthetic complications: no    Last Vitals:  Vitals:   08/29/19 1330 08/29/19 1450  BP: 97/60 96/70  Pulse: 73 79  Resp: 18 16  Temp: 37 C 36.6 C  SpO2: 98% 97%    Last Pain:  Vitals:   08/29/19 1450  TempSrc: Oral  PainSc: 5    Pain Goal:                Epidural/Spinal Function Cutaneous sensation: Normal sensation (08/29/19 1450), Patient able to flex knees: Yes (08/29/19 1450), Patient able to lift hips off bed: Yes (08/29/19 1450), Back pain beyond tenderness at insertion site: No (08/29/19 1450), Progressively worsening motor and/or sensory loss: No (08/29/19 1450), Bowel and/or bladder incontinence post epidural: No (08/29/19 1450)  Huston Foley

## 2019-08-29 NOTE — H&P (Signed)
Jasmine Conrad is a 33 y.o. female G3P1011 at [redacted]w[redacted]d presenting for PROM at 3am today.  Patient reports rare, mild CTX.  Last po was before midnight.  Active FM.  Patient had previous C/S and desires repeat.  GBS positive.  Antepartum course uncomplicated.    OB History    Gravida  3   Para  1   Term  1   Preterm      AB  1   Living  1     SAB      TAB      Ectopic      Multiple  0   Live Births  1          Past Medical History:  Diagnosis Date  . Depression   . Fibroid   . Medical history non-contributory    Past Surgical History:  Procedure Laterality Date  . CESAREAN SECTION N/A 09/10/2017   Procedure: CESAREAN SECTION;  Surgeon: Linda Hedges, DO;  Location: [facility];  Service: Obstetrics;  Laterality: N/A;  Primary edc 09/16/17 NKDA [name] RNFA  . WISDOM TOOTH EXTRACTION     Family History: family history includes Cancer in her maternal grandmother, paternal grandfather, and paternal grandmother; Diabetes in her paternal uncle; Hypertension in her paternal grandmother; Kidney Stones in her mother. Social History:  reports that she has never smoked. She has never used smokeless tobacco. She reports that she does not drink alcohol or use drugs.     Maternal Diabetes: No Genetic Screening: Normal Maternal Ultrasounds/Referrals: Normal Fetal Ultrasounds or other Referrals:  None Maternal Substance Abuse:  No Significant Maternal Medications:  None Significant Maternal Lab Results:  Group B Strep positive Other Comments:  None  Review of Systems Maternal Medical History:  Reason for admission: Rupture of membranes.   Contractions: Onset was 3-5 hours ago.   Frequency: rare.   Perceived severity is mild.    Fetal activity: Perceived fetal activity is normal.   Last perceived fetal movement was within the past hour.    Prenatal complications: no prenatal complications Prenatal Complications - Diabetes: none.    Dilation:  Fingertip Effacement (%): 50 Exam by:: Maryagnes Amos, RN Blood pressure 108/75, pulse 74, temperature 98.5 F (36.9 C), temperature source Oral, resp. rate 17, height 5\' 2"  (1.575 m), weight 70 kg, last menstrual period 12/02/2018, SpO2 99 %, unknown if currently breastfeeding. Maternal Exam:  Uterine Assessment: Contraction strength is mild.  Contraction frequency is rare.   Abdomen: Surgical scars: low transverse.   Fundal height is c/w dates.   Estimated fetal weight is 8#.   Fetal presentation: breech     Fetal Exam Fetal Monitor Review: Baseline rate: 145.  Variability: moderate (6-25 bpm).   Pattern: accelerations present and no decelerations.    Fetal State Assessment: Category I - tracings are normal.     Physical Exam  Constitutional: She is oriented to person, place, and time. She appears well-developed and well-nourished.  Respiratory: Effort normal.  GI: Soft. There is no abdominal tenderness. There is no rebound and no guarding.  Neurological: She is alert and oriented to person, place, and time.  Skin: Skin is warm and dry.  Psychiatric: She has a normal mood and affect. Her behavior is normal.    Prenatal labs: ABO, Rh: O/Positive/-- (05/28 0000) Antibody: Negative (05/28 0000) Rubella: Immune (05/28 0000) RPR: Nonreactive (05/28 0000)  HBsAg: Negative (05/28 0000)  HIV: Non-reactive (05/28 0000)  GBS: Positive/-- (12/16 0000)   Assessment/Plan: 33yo  [id number] at [redacted]w[redacted]d with PROM, previous C/S and desires repeat -PCN for GBS positive -Patient has been counseled re: risk of bleeding, infection, scarring, and damage to surrounding structures.  All questions were answered and patient wishes to proceed.  Linda Hedges 08/29/2019, 7:43 AM

## 2019-08-29 NOTE — Lactation Note (Signed)
This note was copied from a baby's chart. Lactation Consultation Note:  P2, infant is 48 hours old. Mother reports that infant has breastfed twice.  She denies having any discomfort with latch.  Mother lying down and infant placed STS. Mother latched infant in cradle hold.. Infant latched with depth, mother tugged on the lower jaw for wider gape.  Infant sustained latch for 15 mins. Observed good rhythmic suckling. mtoher advised to do breast compression.  Reviewed hand expression. Mother very comfortable and reports that latch is strong and denies pain..  Mother to continue to cue base feed. Discussed cluster feeding. Advised to breastfeed 8-12 times or more in 24 hours. Mother to do frequent STS.   Mother was given Lactation brochure with information on all [facility] services.   Patient Name: Jasmine Conrad [id number] Date: 08/29/2019 Reason for consult: Initial assessment   Maternal Data Has patient been taught Hand Expression?: Yes Does the patient have breastfeeding experience prior to this delivery?: Yes  Feeding Feeding Type: Breast Fed  LATCH Score Latch: Grasps breast easily, tongue down, lips flanged, rhythmical sucking.  Audible Swallowing: Spontaneous and intermittent  Type of Nipple: Everted at rest and after stimulation  Comfort (Breast/Nipple): Soft / non-tender  Hold (Positioning): Assistance needed to correctly position infant at breast and maintain latch.  LATCH Score: 9  Interventions Interventions: Breast feeding basics reviewed;Assisted with latch;Skin to skin;Breast massage;Hand express;Pre-pump if needed;Breast compression;Support pillows;Position options  Lactation Tools Discussed/Used     Consult Status Consult Status: Follow-up Date: 08/30/19 Follow-up type: In-patient    Jess Barters [facility] 08/29/2019, 3:15 PM

## 2019-08-29 NOTE — Op Note (Signed)
[facility] PROCEDURE DATE: 08/29/2019  PREOPERATIVE DIAGNOSIS: Intrauterine pregnancy at  [redacted]w[redacted]d weeks gestation, PROM, previous C/S x 1  POSTOPERATIVE DIAGNOSIS: The same  PROCEDURE:  Repeat Low Transverse Cesarean Section  SURGEON:  Dr. Linda Hedges  INDICATIONS: Jasmine Conrad is a 33 y.o. [id number] at [redacted]w[redacted]d scheduled for cesarean section secondary to PROM and desire for repeat C/S.  The risks of cesarean section discussed with the patient included but were not limited to: bleeding which may require transfusion or reoperation; infection which may require antibiotics; injury to bowel, bladder, ureters or other surrounding organs; injury to the fetus; need for additional procedures including hysterectomy in the event of a life-threatening hemorrhage; placental abnormalities wth subsequent pregnancies, incisional problems, thromboembolic phenomenon and other postoperative/anesthesia complications. The patient concurred with the proposed plan, giving informed written consent for the procedure.    FINDINGS:  Viable female infant in cephalic presentation, APGARs 9,9: weight pending  Clear amniotic fluid.  Intact placenta, three vessel cord.  Grossly normal uterus, ovaries and fallopian tubes. .   ANESTHESIA:  Spinal ESTIMATED BLOOD LOSS: 330 ml SPECIMENS: Placenta sent to L&D COMPLICATIONS: None immediate  PROCEDURE IN DETAIL:  The patient received intravenous antibiotics and had sequential compression devices applied to her lower extremities while in the preoperative area.  She was then taken to the operating room where spinal anesthesia was administered and was found to be adequate. She was then placed in a dorsal supine position with a leftward tilt, and prepped and draped in a sterile manner.  A foley catheter was placed into her bladder and attached to constant gravity.  After an adequate timeout was performed, a Pfannenstiel skin incision was made with scalpel and carried through  to the underlying layer of fascia. The fascia was incised in the midline and this incision was extended bilaterally using the Mayo scissors. Kocher clamps were applied to the superior aspect of the fascial incision and the underlying rectus muscles were dissected off bluntly. A similar process was carried out on the inferior aspect of the facial incision. The rectus muscles were separated in the midline bluntly and the peritoneum was entered bluntly.  Bladder flap was created sharply and developed bluntly.  Bladder blade was placed.  A transverse hysterotomy was made with a scalpel and extended bilaterally bluntly. The bladder blade was then removed. The infant was successfully delivered, and cord was clamped and cut and infant was handed over to awaiting neonatology team. Uterine massage was then administered and the placenta delivered intact with three-vessel cord. The uterus was cleared of clot and debris.  The hysterotomy was closed with 0 chromic.  A second imbricating suture of 0-chromic was used to reinforce the incision and aid in hemostasis.  The peritoneum and rectus muscles were noted to be hemostatic and were reapproximated using 3-0 monocryl in a running fashion.  The fascia was closed with 0-PDS in a running fashion with good restoration of anatomy.  The subcutaneus tissue was copiously irrigated.  The skin was closed with 4-0 vicryl in a subcuticular fashion.  Pt tolerated the procedure will.  All counts were correct x2.  Pt went to the recovery room in stable condition.

## 2019-08-29 NOTE — Anesthesia Procedure Notes (Signed)
Spinal  Patient location during procedure: OR Start time: 08/29/2019 9:10 AM End time: 08/29/2019 9:13 AM Staffing Performed: anesthesiologist  Anesthesiologist: Lyn Hollingshead, MD Preanesthetic Checklist Completed: patient identified, IV checked, site marked, risks and benefits discussed, surgical consent, monitors and equipment checked, pre-op evaluation and timeout performed Spinal Block Patient position: sitting Prep: DuraPrep and site prepped and draped Patient monitoring: continuous pulse ox and blood pressure Approach: midline Location: L3-4 Injection technique: single-shot Needle Needle type: Pencan  Needle gauge: 24 G Needle length: 10 cm Needle insertion depth: 5 cm Assessment Sensory level: T4

## 2019-08-29 NOTE — Anesthesia Preprocedure Evaluation (Signed)
Anesthesia Evaluation  Patient identified by MRN, date of birth, ID band Patient awake    Reviewed: Allergy & Precautions, H&P , NPO status , Patient's Chart, lab work & pertinent test results  Airway Mallampati: I  TM Distance: >3 FB Neck ROM: full    Dental no notable dental hx. (+) Teeth Intact   Pulmonary neg pulmonary ROS,    Pulmonary exam normal breath sounds clear to auscultation       Cardiovascular negative cardio ROS Normal cardiovascular exam Rhythm:regular Rate:Normal     Neuro/Psych negative neurological ROS     GI/Hepatic negative GI ROS, Neg liver ROS,   Endo/Other  negative endocrine ROS  Renal/GU negative Renal ROS  negative genitourinary   Musculoskeletal   Abdominal Normal abdominal exam  (+)   Peds  Hematology  (+) Blood dyscrasia, anemia ,   Anesthesia Other Findings   Reproductive/Obstetrics (+) Pregnancy                             Anesthesia Physical Anesthesia Plan  ASA: II  Anesthesia Plan: Spinal   Post-op Pain Management:    Induction:   PONV Risk Score and Plan: 3 and Ondansetron, Dexamethasone and Scopolamine patch - Pre-op  Airway Management Planned: Nasal Cannula, Simple Face Mask and Natural Airway  Additional Equipment: None  Intra-op Plan:   Post-operative Plan:   Informed Consent: I have reviewed the patients History and Physical, chart, labs and discussed the procedure including the risks, benefits and alternatives for the proposed anesthesia with the patient or authorized representative who has indicated his/her understanding and acceptance.       Plan Discussed with: CRNA  Anesthesia Plan Comments:         Anesthesia Quick Evaluation

## 2019-08-30 ENCOUNTER — Other Ambulatory Visit (HOSPITAL_COMMUNITY)
Admission: RE | Admit: 2019-08-30 | Discharge: 2019-08-30 | Disposition: A | Discharge status: Home / Self Care | Payer: 59 | Source: Ambulatory Visit

## 2019-08-30 LAB — CBC
HCT: 30.7 % — ABNORMAL LOW (ref 36.0–46.0)
Hemoglobin: 10.3 g/dL — ABNORMAL LOW (ref 12.0–15.0)
MCH: 27.7 pg (ref 26.0–34.0)
MCHC: 33.6 g/dL (ref 30.0–36.0)
MCV: 82.5 fL (ref 80.0–100.0)
Platelets: 206 10*3/uL (ref 150–400)
RBC: 3.72 MIL/uL — ABNORMAL LOW (ref 3.87–5.11)
RDW: 13.5 % (ref 11.5–15.5)
WBC: 12.7 10*3/uL — ABNORMAL HIGH (ref 4.0–10.5)
nRBC: 0 % (ref 0.0–0.2)

## 2019-08-30 LAB — BIRTH TISSUE RECOVERY COLLECTION (PLACENTA DONATION)

## 2019-08-30 NOTE — Lactation Note (Signed)
This note was copied from a baby's chart. Lactation Consultation Note  Patient Name: Jasmine Conrad [id number] Date: 08/30/2019 Reason for consult: Follow-up assessment Baby is 26 hours old/4% weight loss.  Mom reports that baby is latching and feeding well.  She hand expressed prior to latch.  Questions answered.  Instructed to continue feeding with cues and call for assist prn.  Maternal Data    Feeding Feeding Type: Breast Fed  LATCH Score Latch: Grasps breast easily, tongue down, lips flanged, rhythmical sucking.  Audible Swallowing: A few with stimulation  Type of Nipple: Everted at rest and after stimulation  Comfort (Breast/Nipple): Soft / non-tender  Hold (Positioning): No assistance needed to correctly position infant at breast.  LATCH Score: 9  Interventions    Lactation Tools Discussed/Used     Consult Status Consult Status: Follow-up Date: 08/31/19 Follow-up type: In-patient    Ave Filter 08/30/2019, 11:46 AM

## 2019-08-30 NOTE — Progress Notes (Signed)
Subjective: Postpartum Day 1: Cesarean Delivery Patient reports tolerating PO and no problems voiding.    Objective: Vital signs in last 24 hours: Temp:  [97.4 F (36.3 C)-99.3 F (37.4 C)] 99.1 F (37.3 C) (12/29 0512) Pulse Rate:  [69-90] 83 (12/29 0512) Resp:  [14-20] 16 (12/29 0512) BP: (81-118)/(54-93) 87/62 (12/29 0512) SpO2:  [96 %-100 %] 97 % (12/28 1450)  Physical Exam:  General: alert, cooperative and no distress Lochia: appropriate Uterine Fundus: firm Incision: healing well DVT Evaluation: No evidence of DVT seen on physical exam.  Recent Labs    08/29/19 0620 08/30/19 0522  HGB 11.9* 10.3*  HCT 35.2* 30.7*    Assessment/Plan: Status post Cesarean section. Doing well postoperatively.  Continue current care.  Jasmine Conrad 08/30/2019, 7:29 AM

## 2019-08-31 MED ORDER — OXYCODONE HCL 5 MG PO TABS: 5.0000 mg | ORAL_TABLET | ORAL | 0 refills | Status: DC | PRN

## 2019-08-31 MED ORDER — FERROUS SULFATE 325 (65 FE) MG PO TBEC: 325.0000 mg | DELAYED_RELEASE_TABLET | Freq: Two times a day (BID) | ORAL | 2 refills | Status: DC

## 2019-08-31 MED ORDER — DOCUSATE SODIUM 100 MG PO CAPS: 100.0000 mg | ORAL_CAPSULE | Freq: Two times a day (BID) | ORAL | 2 refills | Status: DC

## 2019-08-31 MED ORDER — IBUPROFEN 800 MG PO TABS: 800.0000 mg | ORAL_TABLET | Freq: Four times a day (QID) | ORAL | 0 refills | Status: DC | PRN

## 2019-08-31 MED FILL — IBUPROFEN 800 MG TABS: 800 | 7 days supply | Qty: 30 | Fill #0

## 2019-08-31 MED FILL — oxyCODONE HCL 5 MG TABS: 5 | 3 days supply | Qty: 15 | Fill #0

## 2019-08-31 NOTE — Discharge Summary (Signed)
Obstetric Discharge Summary Reason for Admission: rupture of membranes Prenatal Procedures: none Intrapartum Procedures: cesarean: low cervical, transverse Postpartum Procedures: none Complications-Operative and Postpartum: none Hemoglobin  Date Value Ref Range Status  08/30/2019 10.3 (L) 12.0 - 15.0 g/dL Final   HCT  Date Value Ref Range Status  08/30/2019 30.7 (L) 36.0 - 46.0 % Final    Physical Exam:  General: alert, cooperative and appears stated age 33: appropriate Uterine Fundus: firm Incision: healing well, no significant drainage, no dehiscence, no significant erythema DVT Evaluation: No evidence of DVT seen on physical exam. Negative Homan's sign. No cords or calf tenderness. No significant calf/ankle edema.  Discharge Diagnoses: Term Pregnancy-delivered  Discharge Information: Date: 08/31/2019 Activity: pelvic rest Diet: routine Medications: PNV, Ibuprofen, Colace and oxycodone Condition: stable Instructions: refer to practice specific booklet Discharge to: home   Newborn Data: Live born female  Birth Weight: 8 lb 0.4 oz (3640 g) APGAR: 9, 9  Newborn Delivery   Birth date/time: 08/29/2019 09:34:00 Delivery type: C-Section, Low Transverse Trial of labor: No C-section categorization: Repeat      Home with mother.  Tyson Dense 08/31/2019, 9:53 AM

## 2019-08-31 NOTE — Lactation Note (Signed)
This note was copied from a baby's chart. Lactation Consultation Note  Patient Name: Jasmine Conrad [id number] Date: 08/31/2019 Reason for consult: Follow-up assessment Baby is 48 hours old/8% weight loss.  Mom is feeling good about feedings.  Breasts are filling.  Discussed milk coming to volume and the prevention and treatment of engorgement.  She has a breast pump at home.  No questions or concerns.  Reviewed outpatient services and encouraged to call prn.  Maternal Data    Feeding Feeding Type: Breast Fed  LATCH Score Latch: Grasps breast easily, tongue down, lips flanged, rhythmical sucking.  Audible Swallowing: A few with stimulation  Type of Nipple: Everted at rest and after stimulation  Comfort (Breast/Nipple): Soft / non-tender  Hold (Positioning): No assistance needed to correctly position infant at breast.  LATCH Score: 9  Interventions    Lactation Tools Discussed/Used     Consult Status Consult Status: Complete Follow-up type: Call as needed    Ave Filter 08/31/2019, 10:09 AM

## 2019-09-01 ENCOUNTER — Inpatient Hospital Stay (HOSPITAL_COMMUNITY): Admission: AD | Admit: 2019-09-01 | Payer: 59 | Source: Home / Self Care

## 2019-09-01 ENCOUNTER — Inpatient Hospital Stay (HOSPITAL_COMMUNITY): Admission: AD | Admit: 2019-09-01 | Payer: 59 | Source: Home / Self Care | Admitting: Obstetrics & Gynecology

## 2019-09-30 DIAGNOSIS — Z3483 Encounter for supervision of other normal pregnancy, third trimester: Secondary | ICD-10-CM | POA: Diagnosis not present

## 2019-09-30 DIAGNOSIS — Z3482 Encounter for supervision of other normal pregnancy, second trimester: Secondary | ICD-10-CM | POA: Diagnosis not present

## 2019-10-06 DIAGNOSIS — Z1389 Encounter for screening for other disorder: Secondary | ICD-10-CM | POA: Diagnosis not present

## 2019-10-06 DIAGNOSIS — Z01419 Encounter for gynecological examination (general) (routine) without abnormal findings: Secondary | ICD-10-CM | POA: Diagnosis not present

## 2019-10-28 DIAGNOSIS — Z3482 Encounter for supervision of other normal pregnancy, second trimester: Secondary | ICD-10-CM | POA: Diagnosis not present

## 2019-10-28 DIAGNOSIS — Z3483 Encounter for supervision of other normal pregnancy, third trimester: Secondary | ICD-10-CM | POA: Diagnosis not present

## 2019-11-29 DIAGNOSIS — Z3483 Encounter for supervision of other normal pregnancy, third trimester: Secondary | ICD-10-CM | POA: Diagnosis not present

## 2019-11-29 DIAGNOSIS — Z3482 Encounter for supervision of other normal pregnancy, second trimester: Secondary | ICD-10-CM | POA: Diagnosis not present

## 2019-12-06 DIAGNOSIS — R102 Pelvic and perineal pain: Secondary | ICD-10-CM | POA: Diagnosis not present

## 2019-12-06 DIAGNOSIS — N941 Unspecified dyspareunia: Secondary | ICD-10-CM | POA: Diagnosis not present

## 2019-12-06 DIAGNOSIS — N760 Acute vaginitis: Secondary | ICD-10-CM | POA: Diagnosis not present

## 2019-12-13 DIAGNOSIS — M6289 Other specified disorders of muscle: Secondary | ICD-10-CM | POA: Diagnosis not present

## 2019-12-13 DIAGNOSIS — N9411 Superficial (introital) dyspareunia: Secondary | ICD-10-CM | POA: Diagnosis not present

## 2019-12-13 DIAGNOSIS — M6281 Muscle weakness (generalized): Secondary | ICD-10-CM | POA: Diagnosis not present

## 2019-12-13 DIAGNOSIS — M62838 Other muscle spasm: Secondary | ICD-10-CM | POA: Diagnosis not present

## 2019-12-30 DIAGNOSIS — Z3483 Encounter for supervision of other normal pregnancy, third trimester: Secondary | ICD-10-CM | POA: Diagnosis not present

## 2019-12-30 DIAGNOSIS — Z3482 Encounter for supervision of other normal pregnancy, second trimester: Secondary | ICD-10-CM | POA: Diagnosis not present

## 2020-01-17 DIAGNOSIS — M6281 Muscle weakness (generalized): Secondary | ICD-10-CM | POA: Diagnosis not present

## 2020-01-17 DIAGNOSIS — N9411 Superficial (introital) dyspareunia: Secondary | ICD-10-CM | POA: Diagnosis not present

## 2020-01-17 DIAGNOSIS — F329 Major depressive disorder, single episode, unspecified: Secondary | ICD-10-CM | POA: Diagnosis not present

## 2020-01-17 DIAGNOSIS — M62838 Other muscle spasm: Secondary | ICD-10-CM | POA: Diagnosis not present

## 2020-01-17 DIAGNOSIS — M6289 Other specified disorders of muscle: Secondary | ICD-10-CM | POA: Diagnosis not present

## 2020-02-16 DIAGNOSIS — M6281 Muscle weakness (generalized): Secondary | ICD-10-CM | POA: Diagnosis not present

## 2020-02-16 DIAGNOSIS — M6289 Other specified disorders of muscle: Secondary | ICD-10-CM | POA: Diagnosis not present

## 2020-02-16 DIAGNOSIS — N9411 Superficial (introital) dyspareunia: Secondary | ICD-10-CM | POA: Diagnosis not present

## 2020-02-16 DIAGNOSIS — M62838 Other muscle spasm: Secondary | ICD-10-CM | POA: Diagnosis not present

## 2020-03-02 DIAGNOSIS — N9411 Superficial (introital) dyspareunia: Secondary | ICD-10-CM | POA: Diagnosis not present

## 2020-03-02 DIAGNOSIS — F329 Major depressive disorder, single episode, unspecified: Secondary | ICD-10-CM | POA: Diagnosis not present

## 2020-03-02 DIAGNOSIS — M6289 Other specified disorders of muscle: Secondary | ICD-10-CM | POA: Diagnosis not present

## 2020-03-02 DIAGNOSIS — M6281 Muscle weakness (generalized): Secondary | ICD-10-CM | POA: Diagnosis not present

## 2020-03-02 DIAGNOSIS — M62838 Other muscle spasm: Secondary | ICD-10-CM | POA: Diagnosis not present

## 2020-03-29 DIAGNOSIS — M6289 Other specified disorders of muscle: Secondary | ICD-10-CM | POA: Diagnosis not present

## 2020-03-29 DIAGNOSIS — M6281 Muscle weakness (generalized): Secondary | ICD-10-CM | POA: Diagnosis not present

## 2020-03-29 DIAGNOSIS — N9411 Superficial (introital) dyspareunia: Secondary | ICD-10-CM | POA: Diagnosis not present

## 2020-03-29 DIAGNOSIS — M62838 Other muscle spasm: Secondary | ICD-10-CM | POA: Diagnosis not present

## 2020-04-14 ENCOUNTER — Telehealth: Payer: 59 | Admitting: Emergency Medicine

## 2020-04-14 DIAGNOSIS — J029 Acute pharyngitis, unspecified: Secondary | ICD-10-CM

## 2020-04-14 MED ORDER — AMOXICILLIN 500 MG PO CAPS: 500.0000 mg | ORAL_CAPSULE | Freq: Two times a day (BID) | ORAL | 0 refills | Status: AC

## 2020-04-14 NOTE — Progress Notes (Signed)
Time spent: 10 min  We are sorry that you are not feeling well.  Here is how we plan to help!  Based on what you have shared with me it is likely that you have strep pharyngitis.  Strep pharyngitis is inflammation and infection in the back of the throat.  This is an infection cause by bacteria and is treated with antibiotics.  I have prescribed Amoxicillin 500 mg twice a day for 7 days. For throat pain, we recommend over the counter oral pain relief medications such as acetaminophen or aspirin, or anti-inflammatory medications such as ibuprofen or naproxen sodium. Topical treatments such as oral throat lozenges or sprays may be used as needed. Strep infections are not as easily transmitted as other respiratory infections, however we still recommend that you avoid close contact with loved ones, especially the very young and elderly.  Remember to wash your hands thoroughly throughout the day as this is the number one way to prevent the spread of infection and wipe down door knobs and counters with disinfectant.   Home Care:  Only take medications as instructed by your medical team.  Complete the entire course of an antibiotic.  Do not take these medications with alcohol.  A steam or ultrasonic humidifier can help congestion.  You can place a towel over your head and breathe in the steam from hot water coming from a faucet.  Avoid close contacts especially the very young and the elderly.  Cover your mouth when you cough or sneeze.  Always remember to wash your hands.  Get Help Right Away If:  You develop worsening fever or sinus pain.  You develop a severe head ache or visual changes.  Your symptoms persist after you have completed your treatment plan.  Make sure you  Understand these instructions.  Will watch your condition.  Will get help right away if you are not doing well or get worse.  Your e-visit answers were reviewed by a board certified advanced clinical practitioner to  complete your personal care plan.  Depending on the condition, your plan could have included both over the counter or prescription medications.  If there is a problem please reply  once you have received a response from your provider.  Your safety is important to [place].  If you have drug allergies check your prescription carefully.    You can use MyChart to ask questions about today's visit, request a non-urgent call back, or ask for a work or school excuse for 24 hours related to this e-Visit. If it has been greater than 24 hours you will need to follow up with your provider, or enter a new e-Visit to address those concerns.  You will get an e-mail in the next two days asking about your experience.  I hope that your e-visit has been valuable and will speed your recovery. Thank you for using e-visits.  I hope you feel better,   Carmon Sails, PA-C [facility]

## 2020-05-23 DIAGNOSIS — Z0000 Encounter for general adult medical examination without abnormal findings: Secondary | ICD-10-CM | POA: Diagnosis not present

## 2020-05-23 DIAGNOSIS — Z13220 Encounter for screening for lipoid disorders: Secondary | ICD-10-CM | POA: Diagnosis not present

## 2020-09-25 DIAGNOSIS — N939 Abnormal uterine and vaginal bleeding, unspecified: Secondary | ICD-10-CM | POA: Diagnosis not present

## 2020-11-21 ENCOUNTER — Other Ambulatory Visit (HOSPITAL_COMMUNITY): Payer: Self-pay | Admitting: Family Medicine

## 2020-11-21 DIAGNOSIS — L710 Perioral dermatitis: Secondary | ICD-10-CM | POA: Diagnosis not present

## 2020-11-21 MED FILL — metroNIDAZOLE 0.75 % CREA: 0.75 | 30 days supply | Qty: 45 | Fill #0

## 2020-12-25 ENCOUNTER — Ambulatory Visit (INDEPENDENT_AMBULATORY_CARE_PROVIDER_SITE_OTHER): Payer: 59

## 2020-12-25 ENCOUNTER — Ambulatory Visit (HOSPITAL_COMMUNITY)
Admission: EM | Admit: 2020-12-25 | Discharge: 2020-12-25 | Disposition: A | Discharge status: Home / Self Care | Payer: 59 | Attending: Emergency Medicine | Admitting: Emergency Medicine

## 2020-12-25 ENCOUNTER — Other Ambulatory Visit: Payer: Self-pay

## 2020-12-25 ENCOUNTER — Encounter (HOSPITAL_COMMUNITY): Payer: Self-pay | Admitting: Emergency Medicine

## 2020-12-25 DIAGNOSIS — W19XXXA Unspecified fall, initial encounter: Secondary | ICD-10-CM

## 2020-12-25 DIAGNOSIS — S99922A Unspecified injury of left foot, initial encounter: Secondary | ICD-10-CM

## 2020-12-25 DIAGNOSIS — M79604 Pain in right leg: Secondary | ICD-10-CM | POA: Diagnosis not present

## 2020-12-25 DIAGNOSIS — S93602A Unspecified sprain of left foot, initial encounter: Secondary | ICD-10-CM | POA: Diagnosis not present

## 2020-12-25 NOTE — ED Triage Notes (Signed)
Pt presents with right shin/ leg pain and left foot pain. States was holding son and fell down steps earlier tonight. Thinks she landed on right shin and twisted left foot.

## 2020-12-25 NOTE — ED Provider Notes (Signed)
[place]    CSN: [id number] Arrival date & time: 12/25/20  1916      History   Chief Complaint Chief Complaint  Patient presents with  . Leg Pain    right  . Foot Pain    left    HPI Jasmine Conrad is a 35 y.o. female.   Patient here for evaluation following fall.  Reports having left foot pain and tenderness as well as right shin tenderness.  Reports that she was holding her child and is not entirely sure how she fell.  Does have some abrasions and bruising to the right shin.  Patient able to bear weight.  Reports taking ibuprofen prior to arrival for pain.  Denies any fevers, chest pain, shortness of breath, N/V/D, numbness, tingling, weakness, abdominal pain, or headaches.   ROS: As per HPI, all other pertinent ROS negative   The history is provided by the patient.  Leg Pain Foot Pain    Past Medical History:  Diagnosis Date  . Depression   . Fibroid   . Medical history non-contributory     Patient Active Problem List   Diagnosis Date Noted  . Previous cesarean section 08/29/2019  . S/P cesarean section 09/10/2017  . Trauma during pregnancy 07/03/2017  . Left shoulder pain 04/01/2016    Past Surgical History:  Procedure Laterality Date  . CESAREAN SECTION N/A 09/10/2017   Procedure: CESAREAN SECTION;  Surgeon: Linda Hedges, DO;  Location: [facility];  Service: Obstetrics;  Laterality: N/A;  Primary edc 09/16/17 NKDA [name] RNFA  . CESAREAN SECTION N/A 08/29/2019   Procedure: CESAREAN SECTION;  Surgeon: Linda Hedges, DO;  Location: [facility];  Service: Obstetrics;  Laterality: N/A;  REPEAT EDC 09/08/19 NKDA [name],  RNFA  . WISDOM TOOTH EXTRACTION      OB History    Gravida  3   Para  2   Term  2   Preterm      AB  1   Living  2     SAB      IAB      Ectopic      Multiple  0   Live Births  2            Home Medications    Prior to Admission medications   Medication Sig Start Date End Date  Taking? Authorizing Provider  acetaminophen (TYLENOL) 500 MG tablet Take 500 mg by mouth every 6 (six) hours as needed for mild pain or headache.    [provider]  calcium carbonate (TUMS - DOSED IN MG ELEMENTAL CALCIUM) 500 MG chewable tablet Chew 1-2 tablets by mouth 2 (two) times daily as needed for indigestion or heartburn.     [provider]  docusate sodium (COLACE) 100 MG capsule Take 1 capsule (100 mg total) by mouth 2 (two) times daily. 08/31/19   Tyson Dense, MD  ferrous sulfate 325 (65 FE) MG EC tablet Take 1 tablet (325 mg total) by mouth 2 (two) times daily. 08/31/19   Tyson Dense, MD  ibuprofen (ADVIL) 800 MG tablet Take 1 tablet (800 mg total) by mouth every 6 (six) hours as needed for moderate pain. 08/31/19   Tyson Dense, MD  metroNIDAZOLE (METROCREAM) 0.75 % cream APPLY 1 APPLICATION TOPICALLY TO AFFECTED AREA 2 TIMES A DAY 11/21/20 11/21/21  Donald Prose, MD  Omega-3 Fatty Acids (FISH OIL) 875 MG CHEW Chew 875 mg by mouth daily.  [provider]  oxyCODONE (OXY IR/ROXICODONE) 5 MG immediate release tablet Take 1 tablet (5 mg total) by mouth every 4 (four) hours as needed for severe pain. 08/31/19   Tyson Dense, MD  Prenatal Vit-Fe Fumarate-FA (PRENATAL 19) 29-1 MG CHEW Chew 1 tablet by mouth daily.     [provider]  Probiotic Product (PROBIOTIC PO) Take 1 capsule by mouth daily.    [provider]    Family History Family History  Problem Relation Age of Onset  . Hypertension Paternal Grandmother   . Cancer Paternal Grandmother   . Cancer Paternal Grandfather   . Cancer Maternal Grandmother   . Kidney Stones Mother   . Diabetes Paternal Uncle     Social History Social History   Tobacco Use  . Smoking status: Never Smoker  . Smokeless tobacco: Never Used  Vaping Use  . Vaping Use: Never used  Substance Use Topics  . Alcohol use: No    Comment: not since pregnancy  . Drug  use: No     Allergies   Patient has no known allergies.   Review of Systems Review of Systems  Musculoskeletal: Positive for arthralgias, joint swelling and myalgias.  All other systems reviewed and are negative.    Physical Exam Triage Vital Signs ED Triage Vitals  Enc Vitals Group     BP 12/25/20 1944 (!) 96/43     Pulse Rate 12/25/20 1944 87     Resp 12/25/20 1944 16     Temp 12/25/20 1944 98.4 F (36.9 C)     Temp Source 12/25/20 1944 Oral     SpO2 12/25/20 1944 100 %     Weight --      Height --      Head Circumference --      Peak Flow --      Pain Score 12/25/20 1940 5     Pain Loc --      Pain Edu? --      Excl. in [place]? --    No data found.  Updated Vital Signs BP (!) 96/43 (BP Location: Right Arm)   Pulse 87   Temp 98.4 F (36.9 C) (Oral)   Resp 16   LMP 12/21/2020   SpO2 100%   Visual Acuity Right Eye Distance:   Left Eye Distance:   Bilateral Distance:    Right Eye Near:   Left Eye Near:    Bilateral Near:     Physical Exam Vitals and nursing note reviewed.  Constitutional:      General: She is not in acute distress.    Appearance: Normal appearance. She is not ill-appearing, toxic-appearing or diaphoretic.  HENT:     Head: Normocephalic and atraumatic.  Eyes:     Conjunctiva/sclera: Conjunctivae normal.  Cardiovascular:     Rate and Rhythm: Normal rate.     Pulses: Normal pulses.  Pulmonary:     Effort: Pulmonary effort is normal.  Abdominal:     General: Abdomen is flat.  Musculoskeletal:     Cervical back: Normal range of motion.     Right lower leg: Swelling and laceration (abraision and bruising to right shin, no significant tenderness to right lower leg) present. No edema.     Right ankle: Normal range of motion.     Right Achilles Tendon: Normal.     Left ankle:     Left Achilles Tendon: Normal.     Right foot: Normal range of motion.  Left foot: Decreased range of motion. Normal capillary refill. Tenderness and bony  tenderness present. No crepitus. Normal pulse.  Skin:    General: Skin is warm and dry.  Neurological:     General: No focal deficit present.     Mental Status: She is alert and oriented to person, place, and time.  Psychiatric:        Mood and Affect: Mood normal.      UC Treatments / Results  Labs (all labs ordered are listed, but only abnormal results are displayed) Labs Reviewed - No data to display  EKG   Radiology DG Foot Complete Left  Result Date: 12/25/2020 CLINICAL DATA:  35 year old female with fall and trauma to the left foot. EXAM: LEFT FOOT - COMPLETE 3+ VIEW COMPARISON:  None. FINDINGS: There is no acute fracture or dislocation. Probable partially fused old fracture medial navicular versus an accessory ossicle. Correlation with point tenderness recommended. The bones are well mineralized. The soft tissues are unremarkable. IMPRESSION: No acute fracture or dislocation. Electronically Signed   By: Anner Crete M.D.   On: 12/25/2020 20:22    Procedures Procedures (including critical care time)  Medications Ordered in UC Medications - No data to display  Initial Impression / Assessment and Plan / UC Course  I have reviewed the triage vital signs and the nursing notes.  Pertinent labs & imaging results that were available during my care of the patient were reviewed by me and considered in my medical decision making (see chart for details).     Assessment negative for red flags or concerns.  X-ray with no fractures or dislocations.  Patient has been taking ibuprofen and can continue to take ibuprofen 600 to 800 mg up to 3 times a day as needed for pain.  Recommend RICE.  If symptoms are not improving or getting worse over the next few days may consider following up with Ortho or sports medicine.  Final Clinical Impressions(s) / UC Diagnoses   Final diagnoses:  Sprain of left foot, initial encounter  Right leg pain  Fall, initial encounter     Discharge  Instructions     Take Ibuprofen and/or Tylenol as needed for pain.   RICE: Rest as much as possible Ice for 10-15 minutes every 4-6 hours as needed for pain and swelling Compression- use an ace bandage or splint for comfort Elevate above your hip when sitting and standing  Follow up with sports medicine or orthopedics if symptoms do not improve in the next few days.      ED Prescriptions    None     PDMP not reviewed this encounter.   Pearson Forster, NP 12/25/20 2029

## 2020-12-25 NOTE — Discharge Instructions (Addendum)
Take Ibuprofen and/or Tylenol as needed for pain.   RICE: Rest as much as possible Ice for 10-15 minutes every 4-6 hours as needed for pain and swelling Compression- use an ace bandage or splint for comfort Elevate above your hip when sitting and standing  Follow up with sports medicine or orthopedics if symptoms do not improve in the next few days.

## 2021-01-23 DIAGNOSIS — H5213 Myopia, bilateral: Secondary | ICD-10-CM | POA: Diagnosis not present

## 2021-05-28 DIAGNOSIS — Z0000 Encounter for general adult medical examination without abnormal findings: Secondary | ICD-10-CM | POA: Diagnosis not present

## 2021-05-28 DIAGNOSIS — Z13220 Encounter for screening for lipoid disorders: Secondary | ICD-10-CM | POA: Diagnosis not present

## 2021-06-17 ENCOUNTER — Other Ambulatory Visit (HOSPITAL_COMMUNITY): Payer: Self-pay

## 2021-06-17 ENCOUNTER — Telehealth: Payer: 59 | Admitting: Family Medicine

## 2021-06-17 DIAGNOSIS — L03012 Cellulitis of left finger: Secondary | ICD-10-CM | POA: Diagnosis not present

## 2021-06-17 MED ORDER — SULFAMETHOXAZOLE-TRIMETHOPRIM 800-160 MG PO TABS
1.0000 | ORAL_TABLET | Freq: Two times a day (BID) | ORAL | 0 refills | Status: AC
  Filled 2021-06-17: qty 14, 7d supply, fill #0

## 2021-06-17 NOTE — Progress Notes (Signed)
E Visit for Cellulitis  We are sorry that you are not feeling well. Here is how we plan to help!  Based on what you shared with me it looks like you have cellulitis.  Cellulitis looks like areas of skin redness, swelling, and warmth; it develops as a result of bacteria entering under the skin. Little red spots and/or bleeding can be seen in skin, and tiny surface sacs containing fluid can occur. Fever can be present. Cellulitis is almost always on one side of a body, and the lower limbs are the most common site of involvement.   I have prescribed:  Bactrim DS 1 tablet by mouth twice a day for 7 days  HOME CARE:  Take your medications as ordered and take all of them, even if the skin irritation appears to be healing.   GET HELP RIGHT AWAY IF:  Symptoms that don't begin to go away within 48 hours. Severe redness persists or worsens If the area turns color, spreads or swells. If it blisters and opens, develops yellow-brown crust or bleeds. You develop a fever or chills. If the pain increases or becomes unbearable.  Are unable to keep fluids and food down.  MAKE SURE YOU   Understand these instructions. Will watch your condition. Will get help right away if you are not doing well or get worse.  Thank you for choosing an e-visit.  Your e-visit answers were reviewed by a board certified advanced clinical practitioner to complete your personal care plan. Depending upon the condition, your plan could have included both over the counter or prescription medications.  Please review your pharmacy choice. Make sure the pharmacy is open so you can pick up prescription now. If there is a problem, you may contact your provider through [employer] and have the prescription routed to another pharmacy.  Your safety is important to [place]. If you have drug allergies check your prescription carefully.   For the next 24 hours you can use MyChart to ask questions about today's visit, request a  non-urgent call back, or ask for a work or school excuse. You will get an email in the next two days asking about your experience. I hope that your e-visit has been valuable and will speed your recovery.  I provided 5 minutes of non face-to-face time during this encounter for chart review, medication and order placement, as well as and documentation.

## 2021-06-26 ENCOUNTER — Other Ambulatory Visit (HOSPITAL_COMMUNITY): Payer: Self-pay

## 2021-06-26 ENCOUNTER — Telehealth: Payer: 59 | Admitting: Physician Assistant

## 2021-06-26 DIAGNOSIS — T3695XA Adverse effect of unspecified systemic antibiotic, initial encounter: Secondary | ICD-10-CM

## 2021-06-26 DIAGNOSIS — L270 Generalized skin eruption due to drugs and medicaments taken internally: Secondary | ICD-10-CM

## 2021-06-26 MED ORDER — PREDNISONE 10 MG PO TABS
ORAL_TABLET | ORAL | 0 refills | Status: AC
  Filled 2021-06-26: qty 30, 12d supply, fill #0

## 2021-06-26 NOTE — Progress Notes (Signed)
E Visit for Rash  We are sorry that you are not feeling well. Here is how we plan to help!  Based on what you shared with me you may have a virus or an allergic reaction.  It could be from Bactrim use but a lot of times that will show up as more of a "sunburn" like rash. I am sending in a steroid medication for you to take as directed to help resolve. Keep an eye out for fever, chills -- anything to show [place] that this is viral in nature.  You can return to work or school after the rash is gone or when your doctor says it is safe to return with the rash. If anything is worsening or not-improving with medication given, you need to be evaluated in person ASAP.    HOME CARE:  Take cool showers and avoid direct sunlight. Apply cool compress or wet dressings. Take a bath in an oatmeal bath.  Sprinkle content of one Aveeno packet under running faucet with comfortably warm water.  Bathe for 15-20 minutes, 1-2 times daily.  Pat dry with a towel. Do not rub the rash. Use hydrocortisone cream. Take an antihistamine like Benadryl for widespread rashes that itch.  The adult dose of Benadryl is 25-50 mg by mouth 4 times daily. Caution:  This type of medication may cause sleepiness.  Do not drink alcohol, drive, or operate dangerous machinery while taking antihistamines.  Do not take these medications if you have prostate enlargement.  Read package instructions thoroughly on all medications that you take.  GET HELP RIGHT AWAY IF:  Symptoms don't go away after treatment. Severe itching that persists. If you rash spreads or swells. If you rash begins to smell. If it blisters and opens or develops a yellow-brown crust. You develop a fever. You have a sore throat. You become short of breath.  MAKE SURE YOU:  Understand these instructions. Will watch your condition. Will get help right away if you are not doing well or get worse.  Thank you for choosing an e-visit.  Your e-visit answers were reviewed  by a board certified advanced clinical practitioner to complete your personal care plan. Depending upon the condition, your plan could have included both over the counter or prescription medications.  Please review your pharmacy choice. Make sure the pharmacy is open so you can pick up prescription now. If there is a problem, you may contact your provider through [employer] and have the prescription routed to another pharmacy.  Your safety is important to [place]. If you have drug allergies check your prescription carefully.   For the next 24 hours you can use MyChart to ask questions about today's visit, request a non-urgent call back, or ask for a work or school excuse. You will get an email in the next two days asking about your experience. I hope that your e-visit has been valuable and will speed your recovery.

## 2021-06-26 NOTE — Progress Notes (Signed)
I have spent 5 minutes in review of e-visit questionnaire, review and updating patient chart, medical decision making and response to patient.   Jasmine Kelch Cody Milton Streicher, PA-C    

## 2021-07-02 DIAGNOSIS — Z01419 Encounter for gynecological examination (general) (routine) without abnormal findings: Secondary | ICD-10-CM | POA: Diagnosis not present

## 2021-07-02 DIAGNOSIS — Z6822 Body mass index (BMI) 22.0-22.9, adult: Secondary | ICD-10-CM | POA: Diagnosis not present

## 2021-08-12 DIAGNOSIS — S76019A Strain of muscle, fascia and tendon of unspecified hip, initial encounter: Secondary | ICD-10-CM | POA: Diagnosis not present

## 2021-08-12 DIAGNOSIS — M9903 Segmental and somatic dysfunction of lumbar region: Secondary | ICD-10-CM | POA: Diagnosis not present

## 2021-08-12 DIAGNOSIS — M9902 Segmental and somatic dysfunction of thoracic region: Secondary | ICD-10-CM | POA: Diagnosis not present

## 2021-08-12 DIAGNOSIS — M9905 Segmental and somatic dysfunction of pelvic region: Secondary | ICD-10-CM | POA: Diagnosis not present

## 2021-08-15 DIAGNOSIS — M9903 Segmental and somatic dysfunction of lumbar region: Secondary | ICD-10-CM | POA: Diagnosis not present

## 2021-08-15 DIAGNOSIS — M9902 Segmental and somatic dysfunction of thoracic region: Secondary | ICD-10-CM | POA: Diagnosis not present

## 2021-08-15 DIAGNOSIS — S76019A Strain of muscle, fascia and tendon of unspecified hip, initial encounter: Secondary | ICD-10-CM | POA: Diagnosis not present

## 2021-08-15 DIAGNOSIS — M9905 Segmental and somatic dysfunction of pelvic region: Secondary | ICD-10-CM | POA: Diagnosis not present

## 2021-08-22 DIAGNOSIS — M9902 Segmental and somatic dysfunction of thoracic region: Secondary | ICD-10-CM | POA: Diagnosis not present

## 2021-08-22 DIAGNOSIS — M9905 Segmental and somatic dysfunction of pelvic region: Secondary | ICD-10-CM | POA: Diagnosis not present

## 2021-08-22 DIAGNOSIS — S76019A Strain of muscle, fascia and tendon of unspecified hip, initial encounter: Secondary | ICD-10-CM | POA: Diagnosis not present

## 2021-08-22 DIAGNOSIS — M9903 Segmental and somatic dysfunction of lumbar region: Secondary | ICD-10-CM | POA: Diagnosis not present

## 2021-11-20 ENCOUNTER — Telehealth: Payer: 59 | Admitting: Physician Assistant

## 2021-11-20 ENCOUNTER — Other Ambulatory Visit (HOSPITAL_COMMUNITY): Payer: Self-pay

## 2021-11-20 DIAGNOSIS — J020 Streptococcal pharyngitis: Secondary | ICD-10-CM

## 2021-11-20 MED ORDER — AMOXICILLIN 500 MG PO CAPS
500.0000 mg | ORAL_CAPSULE | Freq: Two times a day (BID) | ORAL | 0 refills | Status: AC
  Filled 2021-11-20: qty 20, 10d supply, fill #0

## 2021-11-20 NOTE — Progress Notes (Signed)

## 2021-11-20 NOTE — Progress Notes (Signed)
I have spent 5 minutes in review of e-visit questionnaire, review and updating patient chart, medical decision making and response to patient.   Dodie Parisi Cody Rayan Ines, PA-C    

## 2022-09-09 DIAGNOSIS — Z1151 Encounter for screening for human papillomavirus (HPV): Secondary | ICD-10-CM | POA: Diagnosis not present

## 2022-09-09 DIAGNOSIS — Z01419 Encounter for gynecological examination (general) (routine) without abnormal findings: Secondary | ICD-10-CM | POA: Diagnosis not present

## 2022-09-09 DIAGNOSIS — Z6823 Body mass index (BMI) 23.0-23.9, adult: Secondary | ICD-10-CM | POA: Diagnosis not present

## 2022-09-09 DIAGNOSIS — Z124 Encounter for screening for malignant neoplasm of cervix: Secondary | ICD-10-CM | POA: Diagnosis not present

## 2022-09-09 DIAGNOSIS — N760 Acute vaginitis: Secondary | ICD-10-CM | POA: Diagnosis not present

## 2022-12-12 ENCOUNTER — Other Ambulatory Visit (HOSPITAL_COMMUNITY): Payer: Self-pay

## 2022-12-12 DIAGNOSIS — L710 Perioral dermatitis: Secondary | ICD-10-CM | POA: Diagnosis not present

## 2022-12-12 MED ORDER — METRONIDAZOLE 0.75 % EX GEL
1.0000 | Freq: Every day | CUTANEOUS | 0 refills | Status: AC
  Filled 2022-12-12: qty 45, 25d supply, fill #0

## 2022-12-12 MED ORDER — DOXYCYCLINE HYCLATE 100 MG PO TABS
100.0000 mg | ORAL_TABLET | Freq: Every day | ORAL | 0 refills | Status: DC
  Filled 2022-12-12: qty 30, 30d supply, fill #0

## 2023-02-10 ENCOUNTER — Telehealth: Payer: Self-pay | Admitting: Nurse Practitioner

## 2023-02-10 ENCOUNTER — Other Ambulatory Visit (HOSPITAL_COMMUNITY): Payer: Self-pay

## 2023-02-10 DIAGNOSIS — S30861A Insect bite (nonvenomous) of abdominal wall, initial encounter: Secondary | ICD-10-CM

## 2023-02-10 DIAGNOSIS — W57XXXA Bitten or stung by nonvenomous insect and other nonvenomous arthropods, initial encounter: Secondary | ICD-10-CM

## 2023-02-10 DIAGNOSIS — R21 Rash and other nonspecific skin eruption: Secondary | ICD-10-CM

## 2023-02-10 MED ORDER — DOXYCYCLINE HYCLATE 100 MG PO TABS
100.0000 mg | ORAL_TABLET | Freq: Two times a day (BID) | ORAL | 0 refills | Status: AC
  Filled 2023-02-10: qty 42, 21d supply, fill #0

## 2023-02-10 NOTE — Progress Notes (Signed)
E-Visit for Tick Bite  Thank you for describing your tick bite, Here is how we plan to help! Based on the information that you shared with me it looks like you have A tick that bite that we will treat with a short course of doxycycline.  In most cases a tick bite is painless and does not itch.  Most tick bites in which the tick is quickly removed do not require prescriptions. Ticks can transmit several diseases if they are infected and remain attacked to your skin. Therefore the length that the tick was attached and any symptoms you have experienced after the bite are import to accurately develop your custom treatment plan. In most cases a single dose of doxycycline may prevent the development of a more serious condition.  Based on your information I have Provided a home care guide for tick bites and  instructions on when to call for help. and Your symptoms indicate that you need a longer course of antibiotics and a follow up visit with a provider. I have sent doxycycline 100 mg twice a day for 21 days to the pharmacy that you selected. You will need to schedule a follow up visit with your provider. If you do not have a primary care provider you may use our telehealth physicians on the web at [email or web address]  You can continue to use hydrocortisone topical, and we would also recommend a daily antivitamine like Zyrtec to help with the itching.    Which ticks  are associated with illness?  The Wood Tick (dog tick) is the size of a watermelon seed and can sometimes transmit [facility] spotted fever and [place] tick fever.   The Deer Tick (black-legged tick) is between the size of a poppy seed (pin head) and an apple seed, and can sometimes transmit Lyme disease.  A brown to black tick with a white splotch on its back is likely a female Amblyomma americanum ([place] tick). This tick has been associated with [place] Tick Associated illness ( STARI)  Lyme disease has become the most common  tick-borne illness in the [place]. The risk of Lyme disease following a recognized deer tick bite is estimated to be 1%.  The majority of cases of Lyme disease start with a bull's eye rash at the site of the tick bite. The rash can occur days to weeks (typically 7-10 days) after a tick bite. Treatment with antibiotics is indicated if this rash appears. Flu-like symptoms may accompany the rash, including: fever, chills, headaches, muscle aches, and fatigue. Removing ticks promptly may prevent tick borne disease.  What can be used to prevent Tick Bites?  Insect repellant with at leas 20% DEET. Wearing long pants with sock and shoes. Avoiding tall grass and heavily wooded areas. Checking your skin after being outdoors. Shower with a washcloth after outdoor exposures.  HOME CARE ADVICE FOR TICK BITE  Wood Tick Removal:  Use a pair of tweezers and grasp the wood tick close to the skin (on its head). Pull the wood tick straight upward without twisting or crushing it. Maintain a steady pressure until it releases its grip.   If tweezers aren't available, use fingers, a loop of thread around the jaws, or a needle between the jaws for traction.  Note: covering the tick with petroleum jelly, nail polish or rubbing alcohol doesn't work. Neither does touching the tick with a hot or cold object. Tiny Deer Tick Removal:   Needs to be scraped off with a knife blade  or credit card edge. Place tick in a sealed container (e.g. glass jar, zip lock plastic bag), in case your doctor wants to see it. Tick's Head Removal:  If the wood tick's head breaks off in the skin, it must be removed. Clean the skin. Then use a sterile needle to uncover the head and lift it out or scrape it off.  If a very small piece of the head remains, the skin will eventually slough it off. Antibiotic Ointment:  Wash the wound and your hands with soap and water after removal to prevent catching any tick disease.  Apply an over the  counter antibiotic ointment (e.g. bacitracin) to the bite once. Expected Course: Tick bites normally don't itch or hurt. That's why they often go unnoticed. Call Your Doctor If:  You can't remove the tick or the tick's head Fever, a severe head ache, or rash occur in the next 2 weeks Bite begins to look infected Lyme's disease is common in your area You have not had a tetanus in the last 10 years Your current symptoms become worse    MAKE SURE YOU  Understand these instructions. Will watch your condition. Will get help right away if you are not doing well or get worse.    Thank you for choosing an e-visit.  Your e-visit answers were reviewed by a board certified advanced clinical practitioner to complete your personal care plan. Depending upon the condition, your plan could have included both over the counter or prescription medications.  Please review your pharmacy choice. Make sure the pharmacy is open so you can pick up prescription now. If there is a problem, you may contact your provider through [employer] and have the prescription routed to another pharmacy.  Your safety is important to [place]. If you have drug allergies check your prescription carefully.   For the next 24 hours you can use MyChart to ask questions about today's visit, request a non-urgent call back, or ask for a work or school excuse. You will get an email in the next two days asking about your experience. I hope that your e-visit has been valuable and will speed your recovery.   Meds ordered this encounter  Medications   doxycycline (VIBRA-TABS) 100 MG tablet    Sig: Take 1 tablet (100 mg total) by mouth 2 (two) times daily for 21 days.    Dispense:  42 tablet    Refill:  0    I spent approximately 5 minutes reviewing the patient's history, current symptoms and coordinating their care today.

## 2023-02-25 ENCOUNTER — Other Ambulatory Visit (HOSPITAL_COMMUNITY): Payer: Self-pay

## 2023-04-27 DIAGNOSIS — H5213 Myopia, bilateral: Secondary | ICD-10-CM | POA: Diagnosis not present

## 2023-06-01 ENCOUNTER — Other Ambulatory Visit (HOSPITAL_COMMUNITY): Payer: Self-pay

## 2023-06-01 MED ORDER — INFLUENZA VIRUS VACC SPLIT PF (FLUZONE) 0.5 ML IM SUSY
0.5000 mL | PREFILLED_SYRINGE | Freq: Once | INTRAMUSCULAR | 0 refills | Status: AC
  Filled 2023-06-01: qty 0.5, 1d supply, fill #0

## 2023-06-01 MED ORDER — COVID-19 MRNA VAC-TRIS(PFIZER) 30 MCG/0.3ML IM SUSY
0.3000 mL | PREFILLED_SYRINGE | Freq: Once | INTRAMUSCULAR | 0 refills | Status: AC
  Filled 2023-06-01: qty 0.3, 1d supply, fill #0

## 2023-06-04 ENCOUNTER — Other Ambulatory Visit (HOSPITAL_COMMUNITY): Payer: Self-pay

## 2023-06-04 DIAGNOSIS — N898 Other specified noninflammatory disorders of vagina: Secondary | ICD-10-CM | POA: Diagnosis not present

## 2023-06-04 DIAGNOSIS — R300 Dysuria: Secondary | ICD-10-CM | POA: Diagnosis not present

## 2023-06-04 DIAGNOSIS — R319 Hematuria, unspecified: Secondary | ICD-10-CM | POA: Diagnosis not present

## 2023-06-04 MED ORDER — FLUCONAZOLE 150 MG PO TABS
150.0000 mg | ORAL_TABLET | ORAL | 0 refills | Status: DC
  Filled 2023-06-04: qty 2, 6d supply, fill #0

## 2023-06-04 MED ORDER — NITROFURANTOIN MONOHYD MACRO 100 MG PO CAPS
100.0000 mg | ORAL_CAPSULE | Freq: Two times a day (BID) | ORAL | 0 refills | Status: DC
  Filled 2023-06-04: qty 14, 7d supply, fill #0

## 2023-06-04 MED ORDER — PHENAZOPYRIDINE HCL 100 MG PO TABS
100.0000 mg | ORAL_TABLET | Freq: Three times a day (TID) | ORAL | 0 refills | Status: DC
  Filled 2023-06-04: qty 6, 2d supply, fill #0

## 2023-06-05 ENCOUNTER — Other Ambulatory Visit (HOSPITAL_COMMUNITY): Payer: Self-pay

## 2023-06-05 MED ORDER — TERCONAZOLE 0.4 % VA CREA
1.0000 | TOPICAL_CREAM | Freq: Every day | VAGINAL | 0 refills | Status: DC
  Filled 2023-06-05: qty 45, 7d supply, fill #0

## 2023-06-09 DIAGNOSIS — R300 Dysuria: Secondary | ICD-10-CM | POA: Diagnosis not present

## 2023-06-09 DIAGNOSIS — R102 Pelvic and perineal pain: Secondary | ICD-10-CM | POA: Diagnosis not present

## 2023-06-09 DIAGNOSIS — Z113 Encounter for screening for infections with a predominantly sexual mode of transmission: Secondary | ICD-10-CM | POA: Diagnosis not present

## 2023-06-17 ENCOUNTER — Other Ambulatory Visit (HOSPITAL_COMMUNITY): Payer: Self-pay

## 2023-06-25 DIAGNOSIS — R102 Pelvic and perineal pain: Secondary | ICD-10-CM | POA: Diagnosis not present

## 2023-07-15 DIAGNOSIS — R109 Unspecified abdominal pain: Secondary | ICD-10-CM | POA: Diagnosis not present

## 2023-07-15 DIAGNOSIS — Z0000 Encounter for general adult medical examination without abnormal findings: Secondary | ICD-10-CM | POA: Diagnosis not present

## 2023-07-15 DIAGNOSIS — Z13220 Encounter for screening for lipoid disorders: Secondary | ICD-10-CM | POA: Diagnosis not present

## 2023-09-07 ENCOUNTER — Ambulatory Visit: Payer: Commercial Managed Care - PPO | Admitting: Family Medicine

## 2023-09-07 ENCOUNTER — Encounter: Payer: Self-pay | Admitting: Family Medicine

## 2023-09-07 VITALS — BP 122/76 | Ht 62.0 in | Wt 123.0 lb

## 2023-09-07 DIAGNOSIS — M25552 Pain in left hip: Secondary | ICD-10-CM

## 2023-09-07 NOTE — Progress Notes (Signed)
 PCP: Waylan Almarie SAUNDERS, MD  Subjective:  CC: Lt hip, knee, ankle pain HPI: Patient is a 38 y.o. female here for left hip, knee, and ankle pain.  Patient reports that she had a hip issue 10 years ago requiring a steroid injection and physical therapy. She had some improvement at that time but over the years continued to have some hip pain occasionally. She stopped running due to this pain.   Recently she has noticed increasing pain in her hip, knee, and ankle as she has tried to increase the intensity and range of motion in her workouts. She noticed some laxity and was worried that her knee had instability that would cause a snap. Additionally has noticed some soreness in her posterior ankle.    Has not used any orthotics in her shoes or changes into new shoes recently.    Past Medical History:  Diagnosis Date   Depression    Fibroid    Medical history non-contributory     Current Outpatient Medications on File Prior to Visit  Medication Sig Dispense Refill   acetaminophen  (TYLENOL ) 500 MG tablet Take 500 mg by mouth every 6 (six) hours as needed for mild pain or headache.     calcium  carbonate (TUMS - DOSED IN MG ELEMENTAL CALCIUM ) 500 MG chewable tablet Chew 1-2 tablets by mouth 2 (two) times daily as needed for indigestion or heartburn.      docusate sodium  (COLACE) 100 MG capsule Take 1 capsule (100 mg total) by mouth 2 (two) times daily. 60 capsule 2   ferrous sulfate  325 (65 FE) MG EC tablet Take 1 tablet (325 mg total) by mouth 2 (two) times daily. 60 tablet 2   fluconazole  (DIFLUCAN ) 150 MG tablet Take 1 tablet (150 mg total) by mouth every 72 hours 2 tablet 0   ibuprofen  (ADVIL ) 800 MG tablet Take 1 tablet (800 mg total) by mouth every 6 (six) hours as needed for moderate pain. 30 tablet 0   metroNIDAZOLE  (METROGEL ) 0.75 % gel Apply 1 Application topically daily. 45 g 0   nitrofurantoin , macrocrystal-monohydrate, (MACROBID ) 100 MG capsule Take 1 capsule (100 mg total) by  mouth every 12 (twelve) hours for 7 days. 14 capsule 0   Omega-3 Fatty Acids (FISH OIL) 875 MG CHEW Chew 875 mg by mouth daily.      oxyCODONE  (OXY IR/ROXICODONE ) 5 MG immediate release tablet Take 1 tablet (5 mg total) by mouth every 4 (four) hours as needed for severe pain. 15 tablet 0   phenazopyridine  (PYRIDIUM ) 100 MG tablet Take 1 tablet (100 mg total) by mouth 3 (three) times daily for 2 days. 6 tablet 0   Prenatal Vit-Fe Fumarate-FA (PRENATAL 19) 29-1 MG CHEW Chew 1 tablet by mouth daily.      Probiotic Product (PROBIOTIC PO) Take 1 capsule by mouth daily.     No current facility-administered medications on file prior to visit.    Past Surgical History:  Procedure Laterality Date   CESAREAN SECTION N/A 09/10/2017   Procedure: CESAREAN SECTION;  Surgeon: Dannielle Bouchard, DO;  [facility]: [facility];  Service: Obstetrics;  Laterality: N/A;  Primary edc 09/16/17 NKDA [name] RNFA   CESAREAN SECTION N/A 08/29/2019   Procedure: CESAREAN SECTION;  Surgeon: Dannielle Bouchard, DO;  Location: [facility];  Service: Obstetrics;  Laterality: N/A;  REPEAT EDC 09/08/19 NKDA [name],  RNFA   WISDOM TOOTH EXTRACTION      Allergies  Allergen Reactions   Sulfamethoxazole -Trimethoprim  Rash    BP 122/76  Ht 5' 2 (1.575 m)   Wt 123 lb (55.8 kg)   BMI 22.50 kg/m       No data to display              No data to display              Objective:  Physical Exam:  Gen: NAD, comfortable in exam room L Hip: No deformity. Full range of motion  4/5 strength with resisted hip abduction, and extension.  + tenderness to palpation over gluteal muscle insertion point. Neurovascularly intact distally. Positive trendelenburg.  Negative logroll Positive faber, fadir, and piriformis stretches.   L knee: No extensive deformity, though patient has slight valgus positioning at baseline, no ecchymoses, swelling. no TTP. FROM with normal strength. Negative ant/post drawers. Negative  valgus/varus testing.  NV intact distally.  L Foot and ankle: hammering of toes, longitudinal arch intact, no deformity of achilles tenderness though some tenderness to palpation near insertion point    Assessment & Plan:  1. 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  Patient most likely had an injury 10 year ago causing some pain and inactivation of the left gluteal muscles. Since then patient has developed some weakness and laxity of her left hip. This caused biomechanical forces to cause pain in her knee causing even more valgus forces than baseline and some ankle instability as well.  No tendon issues at the knee or ankle.  - Recommended physical rehab to activate and strengthen gluteus muscles of left hip  Referral placed  - f/u 6-8 weeks to re-evaluate, sooner prn.  Consider imaging if worsening or not improving  Addendum:  Patient seen and examined in the office with resident - Areta Saliva, MD, PGY-2.  History, exam, plan of care were precepted with me.  Agree with findings and plan as documented in resident note with additions made above.  Rainell Cedar, DO, CAQSM

## 2023-09-23 ENCOUNTER — Ambulatory Visit (HOSPITAL_BASED_OUTPATIENT_CLINIC_OR_DEPARTMENT_OTHER): Payer: Commercial Managed Care - PPO | Attending: Family Medicine | Admitting: Physical Therapy

## 2023-09-23 ENCOUNTER — Encounter (HOSPITAL_BASED_OUTPATIENT_CLINIC_OR_DEPARTMENT_OTHER): Payer: Self-pay | Admitting: Physical Therapy

## 2023-09-23 ENCOUNTER — Other Ambulatory Visit: Payer: Self-pay

## 2023-09-23 DIAGNOSIS — M25552 Pain in left hip: Secondary | ICD-10-CM | POA: Insufficient documentation

## 2023-09-23 DIAGNOSIS — R29898 Other symptoms and signs involving the musculoskeletal system: Secondary | ICD-10-CM | POA: Diagnosis not present

## 2023-09-23 DIAGNOSIS — R2689 Other abnormalities of gait and mobility: Secondary | ICD-10-CM | POA: Diagnosis not present

## 2023-09-23 NOTE — Therapy (Signed)
OUTPATIENT PHYSICAL THERAPY LOWER EXTREMITY EVALUATION   Patient Name: Jasmine Conrad MRN: [id number] DOB:[birth year], 38 y.o., female Today's Date: 09/23/2023  END OF SESSION:  PT End of Session - 09/23/23 1246     Visit Number 1    Number of Visits 16    Date for PT Re-Evaluation 11/20/23    Authorization Type [employer]    PT Start Time 1034    PT Stop Time 1110    PT Time Calculation (min) 36 min    Activity Tolerance Patient tolerated treatment well    Behavior During Therapy WFL for tasks assessed/performed             Past Medical History:  Diagnosis Date   Depression    Fibroid    Medical history non-contributory    Past Surgical History:  Procedure Laterality Date   CESAREAN SECTION N/A 09/10/2017   Procedure: CESAREAN SECTION;  Surgeon: Mitchel Honour, DO;  [facility]: [facility];  Service: Obstetrics;  Laterality: N/A;  Primary edc 09/16/17 NKDA [name] RNFA   CESAREAN SECTION N/A 08/29/2019   Procedure: CESAREAN SECTION;  Surgeon: Mitchel Honour, DO;  Location: [facility];  Service: Obstetrics;  Laterality: N/A;  REPEAT EDC 09/08/19 NKDA [name],  RNFA   WISDOM TOOTH EXTRACTION     Patient Active Problem List   Diagnosis Date Noted   Previous cesarean section 08/29/2019   S/P cesarean section 09/10/2017   Trauma during pregnancy 07/03/2017   Left shoulder pain 04/01/2016    PCP: Maryelizabeth Rowan MD  REFERRING PROVIDER: Andi Devon, DO   REFERRING DIAG: [phone] (ICD-10-CM) - Left hip pain   THERAPY DIAG:  Left hip pain  Weakness of left hip  Other abnormalities of gait and mobility  Rationale for Evaluation and Treatment: Rehabilitation  ONSET DATE: exacerbated last 4-5 months  SUBJECTIVE:   SUBJECTIVE STATEMENT: 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 Have been doing ok until last few months having trouble completing anything strenuous throughout left le. Pain started in my hip and now it is my left knee and ankle. Had a cortizone  shot 10 years ago in hip due to an injury which made it better at the time. Has had 2 pregnancies since with c-sections.  Stopped running. Walking down step knee and ankle pop.  Seem to be getting better for a few months then just started getting worse as I ramped up weights  PERTINENT HISTORY: Evaluate and treat for left hip pain and glute weakness. Please include aquatic therapy and land based exercises in treatment plan. PAIN:  Are you having pain? Yes: NPRS scale: current 2/10; during exercise 6/10; least 01/0 Pain location: left glute and anterior hip Pain description: dull ache Aggravating factors: exercises dead lifts; squats  feels unstable Relieving factors: advil  PRECAUTIONS: None  RED FLAGS: None   WEIGHT BEARING RESTRICTIONS: No  FALLS:  Has patient fallen in last 6 months? No  LIVING ENVIRONMENT: Lives with: lives with their family Lives in: House/apartment Stairs: Yes: Internal: 16 steps; on right going up Has following equipment at home: None  OCCUPATION: seated job  PLOF: Independent  PATIENT GOALS: no pain return to run, don't want to hurt myself further  NEXT MD VISIT: 6-8 weeks/prn  OBJECTIVE:  Note: Objective measures were completed at Evaluation unless otherwise noted.  DIAGNOSTIC FINDINGS: DG Lumbar IMPRESSION: Negative.  PATIENT SURVEYS:  FOTO Primary score 69% with goal of 82%  COGNITION: Overall cognitive status: Within functional limits for tasks assessed  SENSATION: WFL   MUSCLE LENGTH: Hamstrings: wfl  POSTURE: No Significant postural limitations  PALPATION: TTP left iliopsoas/psoas; left Glut insertion   LOWER EXTREMITY ROM:  WFL  LOWER EXTREMITY MMT:  MMT Right eval Left eval  Hip flexion 49.1 45.2  Hip extension 5/5 4/5  Hip abduction 22.6 23.9  Hip adduction    Hip internal rotation  4 no P!  Hip external rotation 5 4 P!  Knee flexion    Knee extension 50.9 53.4 P! Hip flex  Ankle dorsiflexion    Ankle  plantarflexion    Ankle inversion    Ankle eversion     (Blank rows = not tested)  LOWER EXTREMITY SPECIAL TESTS:  Hip special tests: Luisa Hart (FABER) test: positive , Trendelenburg test: positive , Thomas test: negative, and SI distraction test: negative  FUNCTIONAL TESTS:  5 times sit to stand: 7.28   GAIT: Unlimited No visual trendelenburg with amb but tests positive with actual test.                                                                                                                                TREATMENT  Eval   PATIENT EDUCATION:  Education details: Discussed eval findings, rehab rationale, aquatic program progression/POC and pools in area. Patient is in agreement  Person educated: Patient Education method: Explanation Education comprehension: verbalized understanding  HOME EXERCISE PROGRAM: Continue with indep exercise regimen with reduction of any activities that cause left hip/knee/ankle pain  ASSESSMENT:  CLINICAL IMPRESSION: Patient is a 38 y.o. f who was seen today for physical therapy evaluation and treatment for Left hip pain. Pt reports old injury x 10 years ago which resolved after cortizone injection.  It has given her issue off and on over the past years but exacerbated in the last 4 months. She is an active exerciser and has 2 small children.  Reports she has continued with her exercise regimen backing off some when left hip/knee and ankle begin to hurt. She has stopped running altogether. Xray is neg for lumbar spine dysfunction.  She has positive Trendelenburg sign, demonstrates left glut weakness, does not appear to have SI involvement at todays assessment. Left iliopsoas/psoas muscle tightness present with palpation as well as some tenderness. 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  She will benefit from skilled physical therapy to  improve left glut/hip complex strength to normalize bio mechanic stability.  Plan to spend some time in aquatics but majority of session land based.  OBJECTIVE IMPAIRMENTS: decreased activity tolerance, decreased strength, increased muscle spasms, and pain.   ACTIVITY LIMITATIONS: lifting, squatting, and stairs  PARTICIPATION LIMITATIONS:  normal exercise regimen  PERSONAL FACTORS: Fitness and Time since onset of injury/illness/exacerbation are also affecting patient's functional outcome.   REHAB POTENTIAL: Good  CLINICAL DECISION MAKING: Evolving/moderate complexity  EVALUATION  COMPLEXITY: Moderate   GOALS: Goals reviewed with patient? Yes  SHORT TERM GOALS: Target date: 10/12/23 Pt will report decreased pain with exercise submerged to <2/10 and in positions land based which cause pain Baseline:squats/hip flex, descending steps; 6/10 Goal status: INITIAL  2.  Pt will demonstrate indep with aquatic exercises and will make decision on best setting for exercise going forward.  Aquatic HEP to be assigned as approp. Baseline:  Goal status: INITIAL   LONG TERM GOALS: Target date: 11/20/23  Pt to meet stated Foto Goal of 82% Baseline: 69% Goal status: INITIAL  2.  Pt will tolerate running short distance (under 1 mile) Baseline:  Goal status: INITIAL  3.  Pt will descend 1 flight of steps without pain Baseline: pain Goal status: INITIAL  4.  Pt will improve strength in Left hip = to contralateral side to demonstrate improved biomechanical stability of hip Baseline:  Goal status: INITIAL  5.  Pt will return to indep regimen of exercises without limitation due to weakness or pain Baseline: limited Goal status: INITIAL  6.  Pt will amb without trendelenburg left hip Baseline: positive tendelenburg Goal status: INITIAL   PLAN:  PT FREQUENCY: 2x/week  PT DURATION: 8 weeks  PLANNED INTERVENTIONS: 97164- PT Re-evaluation, 97110-Therapeutic exercises, 97530- Therapeutic  activity, [id number]- Neuromuscular re-education, 97535- Self Care, 96295- Manual therapy, [id number]- Gait training, [phone]- Orthotic Fit/training, [phone]- Aquatic Therapy, 97014- Electrical stimulation (unattended), [phone]- Ionotophoresis 4mg /ml Dexamethasone, Patient/Family education, Balance training, Stair training, Taping, Dry Needling, Joint mobilization, DME instructions, Cryotherapy, and Moist heat  PLAN FOR NEXT SESSION: hip strengthening special attention to glutes, knee strengthening, gait training, land based consider DN; stretching program, pain control.   Corrie Dandy [place]) Emmanuela Ghazi MPT 09/23/23 12:47 PM [facility] [address] [place], [place], [address] Phone: [phone]   Fax:  [phone]

## 2023-10-01 ENCOUNTER — Encounter (HOSPITAL_BASED_OUTPATIENT_CLINIC_OR_DEPARTMENT_OTHER): Payer: Self-pay | Admitting: Physical Therapy

## 2023-10-01 ENCOUNTER — Ambulatory Visit (HOSPITAL_BASED_OUTPATIENT_CLINIC_OR_DEPARTMENT_OTHER): Payer: Commercial Managed Care - PPO | Admitting: Physical Therapy

## 2023-10-01 DIAGNOSIS — R29898 Other symptoms and signs involving the musculoskeletal system: Secondary | ICD-10-CM | POA: Diagnosis not present

## 2023-10-01 DIAGNOSIS — R2689 Other abnormalities of gait and mobility: Secondary | ICD-10-CM | POA: Diagnosis not present

## 2023-10-01 DIAGNOSIS — M25552 Pain in left hip: Secondary | ICD-10-CM | POA: Diagnosis not present

## 2023-10-01 NOTE — Therapy (Signed)
OUTPATIENT PHYSICAL THERAPY LOWER EXTREMITY EVALUATION   Patient Name: Jasmine Conrad MRN: [id number] DOB:July 12, 1986, 38 y.o., female Today's Date: 10/01/2023  END OF SESSION:  PT End of Session - 10/01/23 1034     Visit Number 2    Number of Visits 16    Date for PT Re-Evaluation 11/20/23    Authorization Type [employer]    PT Start Time 1038    PT Stop Time 1118    PT Time Calculation (min) 40 min    Activity Tolerance Patient tolerated treatment well    Behavior During Therapy WFL for tasks assessed/performed             Past Medical History:  Diagnosis Date   Depression    Fibroid    Medical history non-contributory    Past Surgical History:  Procedure Laterality Date   CESAREAN SECTION N/A 09/10/2017   Procedure: CESAREAN SECTION;  Surgeon: Mitchel Honour, DO;  Location: [facility];  Service: Obstetrics;  Laterality: N/A;  Primary edc 09/16/17 NKDA [name] RNFA   CESAREAN SECTION N/A 08/29/2019   Procedure: CESAREAN SECTION;  Surgeon: Mitchel Honour, DO;  Location: [facility];  Service: Obstetrics;  Laterality: N/A;  REPEAT EDC 09/08/19 NKDA [name],  RNFA   WISDOM TOOTH EXTRACTION     Patient Active Problem List   Diagnosis Date Noted   Previous cesarean section 08/29/2019   S/P cesarean section 09/10/2017   Trauma during pregnancy 07/03/2017   Left shoulder pain 04/01/2016    PCP: Maryelizabeth Rowan MD  REFERRING PROVIDER: Andi Devon, DO   REFERRING DIAG: [phone] (ICD-10-CM) - Left hip pain   THERAPY DIAG:  Left hip pain  Weakness of left hip  Other abnormalities of gait and mobility  Rationale for Evaluation and Treatment: Rehabilitation  ONSET DATE: exacerbated last 4-5 months  SUBJECTIVE:   SUBJECTIVE STATEMENT: doing ok everything is about the same  Initial subjective 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 Have been doing ok until last few months having trouble completing anything strenuous throughout left le. Pain started in  my hip and now it is my left knee and ankle. Had a cortizone shot 10 years ago in hip due to an injury which made it better at the time. Has had 2 pregnancies since with c-sections.  Stopped running. Walking down step knee and ankle pop.  Seem to be getting better for a few months then just started getting worse as I ramped up weights  PERTINENT HISTORY: Evaluate and treat for left hip pain and glute weakness. Please include aquatic therapy and land based exercises in treatment plan. PAIN:  Are you having pain? Yes: NPRS scale: current 2/10; during exercise 6/10; least 01/0 Pain location: left glute and anterior hip Pain description: dull ache Aggravating factors: exercises dead lifts; squats  feels unstable Relieving factors: advil  PRECAUTIONS: None  RED FLAGS: None   WEIGHT BEARING RESTRICTIONS: No  FALLS:  Has patient fallen in last 6 months? No  LIVING ENVIRONMENT: Lives with: lives with their family Lives in: House/apartment Stairs: Yes: Internal: 16 steps; on right going up Has following equipment at home: None  OCCUPATION: seated job  PLOF: Independent  PATIENT GOALS: no pain return to run, don't want to hurt myself further  NEXT MD VISIT: 6-8 weeks/prn  OBJECTIVE:  Note: Objective measures were completed at Evaluation unless otherwise noted.  DIAGNOSTIC FINDINGS: DG Lumbar IMPRESSION: Negative.  PATIENT SURVEYS:  FOTO Primary score 69% with goal of 82%  COGNITION:  Overall cognitive status: Within functional limits for tasks assessed     SENSATION: WFL   MUSCLE LENGTH: Hamstrings: wfl  POSTURE: No Significant postural limitations  PALPATION: TTP left iliopsoas/psoas; left Glut insertion   LOWER EXTREMITY ROM:  WFL  LOWER EXTREMITY MMT:  MMT Right eval Left eval  Hip flexion 49.1 45.2  Hip extension 5/5 4/5  Hip abduction 22.6 23.9  Hip adduction    Hip internal rotation  4 no P!  Hip external rotation 5 4 P!  Knee flexion    Knee  extension 50.9 53.4 P! Hip flex  Ankle dorsiflexion    Ankle plantarflexion    Ankle inversion    Ankle eversion     (Blank rows = not tested)  LOWER EXTREMITY SPECIAL TESTS:  Hip special tests: Luisa Hart (FABER) test: positive , Trendelenburg test: positive , Thomas test: negative, and SI distraction test: negative  FUNCTIONAL TESTS:  5 times sit to stand: 7.28   GAIT: Unlimited No visual trendelenburg with amb but tests positive with actual test.                                                                                                                                TREATMENT  Pt seen for aquatic therapy today.  Treatment took place in water 3.5-4.75 ft in depth at the [facility] pool. Temp of water was 91.  Pt entered/exited the pool via stair using alternating pattern with hand rail.  *intro to setting *walking *hip flex stretching using solid noodle. Good stretch stronger L>R *3 way hamstring stretch: gastroc;add and IT band *figure 4 stretch *hip hiking bottom step R/L 2 x 12 *hip hinging 2 x 10 *backward amb long strides x 4 lengths *curtsey squat lle x 10 *side lunge x4 width ue add/abd using yellow HB *War[employer]  Pt requires the buoyancy and hydrostatic pressure of water for support, and to offload joints by unweighting joint load by at least 50 % in navel deep water and by at least 75-80% in chest to neck deep water.  Viscosity of the water is needed for resistance of strengthening. Water current perturbations provides challenge to standing balance requiring increased core activation.     PATIENT EDUCATION:  Education details: Discussed eval findings, rehab rationale, aquatic program progression/POC and pools in area. Patient is in agreement  Person educated: Patient Education method: Explanation Education comprehension: verbalized understanding  HOME EXERCISE PROGRAM: Continue with indep exercise regimen with reduction of any activities that  cause left hip/knee/ankle pain  ASSESSMENT:  CLINICAL IMPRESSION: Pt demonstrates safety and indep in setting with therapist instructing from deck. Pt directed through le and core stretching.  Began strengthening which focused on posterior core. She clearly has left glut weakness visually with all exercises compared to right.  No reports of pain.  She is well coordinated completing all exercises with minimal direction.  She is a good candidate for aquatic therapy intervention and  will benefit form the properties of water to progress towards land based goals.   Initial impression Patient is a 38 y.o. f who was seen today for physical therapy evaluation and treatment for Left hip pain. Pt reports old injury x 10 years ago which resolved after cortizone injection.  It has given her issue off and on over the past years but exacerbated in the last 4 months. She is an active exerciser and has 2 small children.  Reports she has continued with her exercise regimen backing off some when left hip/knee and ankle begin to hurt. She has stopped running altogether. Xray is neg for lumbar spine dysfunction.  She has positive Trendelenburg sign, demonstrates left glut weakness, does not appear to have SI involvement at todays assessment. Left iliopsoas/psoas muscle tightness present with palpation as well as some tenderness. 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  She will benefit from skilled physical therapy to improve left glut/hip complex strength to normalize bio mechanic stability.  Plan to spend some time in aquatics but majority of session land based.  OBJECTIVE IMPAIRMENTS: decreased activity tolerance, decreased strength, increased muscle spasms, and pain.   ACTIVITY LIMITATIONS: lifting, squatting, and stairs  PARTICIPATION LIMITATIONS:  normal exercise regimen  PERSONAL FACTORS: Fitness  and Time since onset of injury/illness/exacerbation are also affecting patient's functional outcome.   REHAB POTENTIAL: Good  CLINICAL DECISION MAKING: Evolving/moderate complexity  EVALUATION COMPLEXITY: Moderate   GOALS: Goals reviewed with patient? Yes  SHORT TERM GOALS: Target date: 10/12/23 Pt will report decreased pain with exercise submerged to <2/10 and in positions land based which cause pain Baseline:squats/hip flex, descending steps; 6/10 Goal status: INITIAL  2.  Pt will demonstrate indep with aquatic exercises and will make decision on best setting for exercise going forward.  Aquatic HEP to be assigned as approp. Baseline:  Goal status: INITIAL   LONG TERM GOALS: Target date: 11/20/23  Pt to meet stated Foto Goal of 82% Baseline: 69% Goal status: INITIAL  2.  Pt will tolerate running short distance (under 1 mile) Baseline:  Goal status: INITIAL  3.  Pt will descend 1 flight of steps without pain Baseline: pain Goal status: INITIAL  4.  Pt will improve strength in Left hip = to contralateral side to demonstrate improved biomechanical stability of hip Baseline:  Goal status: INITIAL  5.  Pt will return to indep regimen of exercises without limitation due to weakness or pain Baseline: limited Goal status: INITIAL  6.  Pt will amb without trendelenburg left hip Baseline: positive tendelenburg Goal status: INITIAL   PLAN:  PT FREQUENCY: 2x/week  PT DURATION: 8 weeks  PLANNED INTERVENTIONS: 97164- PT Re-evaluation, 97110-Therapeutic exercises, 97530- Therapeutic activity, 97112- Neuromuscular re-education, 97535- Self Care, 96045- Manual therapy, [phone]- Gait training, [phone]- Orthotic Fit/training, [phone]- Aquatic Therapy, 97014- Electrical stimulation (unattended), [phone]- Ionotophoresis 4mg /ml Dexamethasone, Patient/Family education, Balance training, Stair training, Taping, Dry Needling, Joint mobilization, DME instructions, Cryotherapy, and Moist  heat  PLAN FOR NEXT SESSION: hip strengthening special attention to glutes, knee strengthening, gait training, land based consider DN; stretching program special attention to hip flex, pain control.   [address] [place]) Evani Shrider MPT 10/01/23 11:19 AM [facility] [address] [place], [place], [address] Phone: [phone]   Fax:  [phone]

## 2023-10-08 DIAGNOSIS — J029 Acute pharyngitis, unspecified: Secondary | ICD-10-CM | POA: Diagnosis not present

## 2023-10-09 ENCOUNTER — Encounter (HOSPITAL_BASED_OUTPATIENT_CLINIC_OR_DEPARTMENT_OTHER): Payer: Self-pay | Admitting: Physical Therapy

## 2023-10-09 ENCOUNTER — Ambulatory Visit (HOSPITAL_BASED_OUTPATIENT_CLINIC_OR_DEPARTMENT_OTHER): Payer: Commercial Managed Care - PPO | Attending: Family Medicine | Admitting: Physical Therapy

## 2023-10-09 DIAGNOSIS — R2689 Other abnormalities of gait and mobility: Secondary | ICD-10-CM | POA: Insufficient documentation

## 2023-10-09 DIAGNOSIS — R29898 Other symptoms and signs involving the musculoskeletal system: Secondary | ICD-10-CM | POA: Insufficient documentation

## 2023-10-09 DIAGNOSIS — M25552 Pain in left hip: Secondary | ICD-10-CM | POA: Insufficient documentation

## 2023-10-09 NOTE — Therapy (Signed)
 OUTPATIENT PHYSICAL THERAPY LOWER EXTREMITY TREATMENT   Patient Name: Jasmine Conrad MRN: [id number] DOB:11/05/1985, 38 y.o., female Today's Date: 10/09/2023  END OF SESSION:  PT End of Session - 10/09/23 1147     Visit Number 3    Number of Visits 16    Date for PT Re-Evaluation 11/20/23    Authorization Type Aet[employer]    PT Start Time 1147    PT Stop Time 1225    PT Time Calculation (min) 38 min    Activity Tolerance Patient tolerated treatment well    Behavior During Therapy WFL for tasks assessed/performed             Past Medical History:  Diagnosis Date   Depression    Fibroid    Medical history non-contributory    Past Surgical History:  Procedure Laterality Date   CESAREAN SECTION N/A 09/10/2017   Procedure: CESAREAN SECTION;  Surgeon: Dannielle Bouchard, DO;  [facility]: [facility];  Service: Obstetrics;  Laterality: N/A;  Primary edc 09/16/17 NKDA [name] RNFA   CESAREAN SECTION N/A 08/29/2019   Procedure: CESAREAN SECTION;  Surgeon: Dannielle Bouchard, DO;  Location: [facility];  Service: Obstetrics;  Laterality: N/A;  REPEAT EDC 09/08/19 NKDA [name],  RNFA   WISDOM TOOTH EXTRACTION     Patient Active Problem List   Diagnosis Date Noted   Previous cesarean section 08/29/2019   S/P cesarean section 09/10/2017   Trauma during pregnancy 07/03/2017   Left shoulder pain 04/01/2016    PCP: Almarie Scala MD  REFERRING PROVIDER: Teressa Rainell BROCKS, DO   REFERRING DIAG: [phone] (ICD-10-CM) - Left hip pain   THERAPY DIAG:  Left hip pain  Weakness of left hip  Other abnormalities of gait and mobility  Rationale for Evaluation and Treatment: Rehabilitation  ONSET DATE: exacerbated last 4-5 months  SUBJECTIVE:   SUBJECTIVE STATEMENT: Pt reports that she felt fine after last session, until she sat in car and hip locked up.    Initial subjective 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 Have been doing ok until last few months having trouble completing  anything strenuous throughout left le. Pain started in my hip and now it is my left knee and ankle. Had a cortizone shot 10 years ago in hip due to an injury which made it better at the time. Has had 2 pregnancies since with c-sections.  Stopped running. Walking down step knee and ankle pop.  Seem to be getting better for a few months then just started getting worse as I ramped up weights  PERTINENT HISTORY: Evaluate and treat for left hip pain and glute weakness. Please include aquatic therapy and land based exercises in treatment plan. PAIN:  Are you having pain? Yes: NPRS scale: current 2/10; during exercise 6/10 Pain location: left glute and anterior hip Pain description: dull ache Aggravating factors: exercises dead lifts; squats  feels unstable Relieving factors: advil   PRECAUTIONS: None  RED FLAGS: None   WEIGHT BEARING RESTRICTIONS: No  FALLS:  Has patient fallen in last 6 months? No  LIVING ENVIRONMENT: Lives with: lives with their family Lives in: House/apartment Stairs: Yes: Internal: 16 steps; on right going up Has following equipment at home: None  OCCUPATION: seated job  PLOF: Independent  PATIENT GOALS: no pain return to run, don't want to hurt myself further  NEXT MD VISIT: 6-8 weeks/prn  OBJECTIVE:  Note: Objective measures were completed at Evaluation unless otherwise noted.  DIAGNOSTIC FINDINGS: DG Lumbar IMPRESSION: Negative.  PATIENT SURVEYS:  FOTO Primary score 69% with goal of 82%  COGNITION: Overall cognitive status: Within functional limits for tasks assessed     SENSATION: WFL   MUSCLE LENGTH: Hamstrings: wfl  POSTURE: No Significant postural limitations  PALPATION: TTP left iliopsoas/psoas; left Glut insertion   LOWER EXTREMITY ROM:  WFL  LOWER EXTREMITY MMT:  MMT Right eval Left eval  Hip flexion 49.1 45.2  Hip extension 5/5 4/5  Hip abduction 22.6 23.9  Hip adduction    Hip internal rotation  4 no P!  Hip  external rotation 5 4 P!  Knee flexion    Knee extension 50.9 53.4 P! Hip flex  Ankle dorsiflexion    Ankle plantarflexion    Ankle inversion    Ankle eversion     (Blank rows = not tested)  LOWER EXTREMITY SPECIAL TESTS:  Hip special tests: Belvie (FABER) test: positive , Trendelenburg test: positive , Thomas test: negative, and SI distraction test: negative  FUNCTIONAL TESTS:  5 times sit to stand: 7.28   GAIT: Unlimited No visual trendelenburg with amb but tests positive with actual test.                                                                                                                                TREATMENT  Pt seen for aquatic therapy today.  Treatment took place in water 3.5-4.75 ft in depth at the [facility] pool. Temp of water was 91.  Pt entered/exited the pool via stair using alternating pattern with hand rail.  *walking unsupported forward/ backward with reciprocal arm swing with cues for vertical trunk * side stepping  * side step into wide squat with UE on yellow hand floats - painful, stopped * forward walking split squats with UE on yellow hand floats - some discomfort in L groin when LLE forward * UE on yellow hand floats:  leg swings into hip abdct/ addct x 10 each; hip flex/ext 2 x 5  * single leg superman x 5 each side (limited tolerance in L SLS);  hip circles (toe staying in contact with floor) x 8 each side * curtsy lunges with cues for form  *hip flexor/ quad stretch with ankle on solid noodle *3 way LE stretch with ankle on solid noodle: hamstring,addct, and IT band x 15-20sec each    PATIENT EDUCATION:  Education details: Discussed eval findings, rehab rationale, aquatic program progression/POC and pools in area. Patient is in agreement  Person educated: Patient Education method: Explanation Education comprehension: verbalized understanding  HOME EXERCISE PROGRAM: Continue with indep exercise regimen with reduction of any  activities that cause left hip/knee/ankle pain  ASSESSMENT:  CLINICAL IMPRESSION: Pt reported sharp pain in L hip with wide and narrow squats in the water. Some L groin pain with forward lunge.  Pain in L hip overall remained 2-3/10 during session.  Pt advised to inform therapist if exercise is increasing pain during session.   She is  a good candidate for aquatic therapy intervention and will benefit form the properties of water to progress towards land based goals.  Goals are ongoing.    Initial impression Patient is a 38 y.o. f who was seen today for physical therapy evaluation and treatment for Left hip pain. Pt reports old injury x 10 years ago which resolved after cortizone injection.  It has given her issue off and on over the past years but exacerbated in the last 4 months. She is an active exerciser and has 2 small children.  Reports she has continued with her exercise regimen backing off some when left hip/knee and ankle begin to hurt. She has stopped running altogether. Xray is neg for lumbar spine dysfunction.  She has positive Trendelenburg sign, demonstrates left glut weakness, does not appear to have SI involvement at todays assessment. Left iliopsoas/psoas muscle tightness present with palpation as well as some tenderness. 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  She will benefit from skilled physical therapy to improve left glut/hip complex strength to normalize bio mechanic stability.  Plan to spend some time in aquatics but majority of session land based.  OBJECTIVE IMPAIRMENTS: decreased activity tolerance, decreased strength, increased muscle spasms, and pain.   ACTIVITY LIMITATIONS: lifting, squatting, and stairs  PARTICIPATION LIMITATIONS:  normal exercise regimen  PERSONAL FACTORS: Fitness and Time since onset of injury/illness/exacerbation are also affecting  patient's functional outcome.   REHAB POTENTIAL: Good  CLINICAL DECISION MAKING: Evolving/moderate complexity  EVALUATION COMPLEXITY: Moderate   GOALS: Goals reviewed with patient? Yes  SHORT TERM GOALS: Target date: 10/12/23 Pt will report decreased pain with exercise submerged to <2/10 and in positions land based which cause pain Baseline:squats/hip flex, descending steps; 6/10 Goal status: INITIAL  2.  Pt will demonstrate indep with aquatic exercises and will make decision on best setting for exercise going forward.  Aquatic HEP to be assigned as approp. Baseline:  Goal status: INITIAL   LONG TERM GOALS: Target date: 11/20/23  Pt to meet stated Foto Goal of 82% Baseline: 69% Goal status: INITIAL  2.  Pt will tolerate running short distance (under 1 mile) Baseline:  Goal status: INITIAL  3.  Pt will descend 1 flight of steps without pain Baseline: pain Goal status: INITIAL  4.  Pt will improve strength in Left hip = to contralateral side to demonstrate improved biomechanical stability of hip Baseline:  Goal status: INITIAL  5.  Pt will return to indep regimen of exercises without limitation due to weakness or pain Baseline: limited Goal status: INITIAL  6.  Pt will amb without trendelenburg left hip Baseline: positive tendelenburg Goal status: INITIAL   PLAN:  PT FREQUENCY: 2x/week  PT DURATION: 8 weeks  PLANNED INTERVENTIONS: 97164- PT Re-evaluation, 97110-Therapeutic exercises, 97530- Therapeutic activity, 97112- Neuromuscular re-education, 97535- Self Care, 02859- Manual therapy, [phone]- Gait training, [phone]- Orthotic Fit/training, [phone]- Aquatic Therapy, 97014- Electrical stimulation (unattended), [phone]- Ionotophoresis 4mg /ml Dexamethasone , Patient/Family education, Balance training, Stair training, Taping, Dry Needling, Joint mobilization, DME instructions, Cryotherapy, and Moist heat  PLAN FOR NEXT SESSION: hip strengthening special attention to glutes,  knee strengthening, gait training, land based consider DN; stretching program special attention to hip flex, pain control.  Delon Aquas, PTA 10/09/23 12:28 PM [facility] [address] [place], [place], [address] Phone: [phone]   Fax:  [phone]

## 2023-10-16 ENCOUNTER — Encounter (HOSPITAL_BASED_OUTPATIENT_CLINIC_OR_DEPARTMENT_OTHER): Payer: Self-pay | Admitting: Physical Therapy

## 2023-10-16 ENCOUNTER — Ambulatory Visit (HOSPITAL_BASED_OUTPATIENT_CLINIC_OR_DEPARTMENT_OTHER): Payer: Commercial Managed Care - PPO | Admitting: Physical Therapy

## 2023-10-16 DIAGNOSIS — R2689 Other abnormalities of gait and mobility: Secondary | ICD-10-CM

## 2023-10-16 DIAGNOSIS — M25552 Pain in left hip: Secondary | ICD-10-CM

## 2023-10-16 DIAGNOSIS — R29898 Other symptoms and signs involving the musculoskeletal system: Secondary | ICD-10-CM

## 2023-10-16 NOTE — Therapy (Signed)
OUTPATIENT PHYSICAL THERAPY LOWER EXTREMITY TREATMENT   Patient Name: Jasmine Conrad MRN: [id number] DOB:[birth year], 38 y.o., female Today's Date: 10/16/2023  END OF SESSION:  PT End of Session - 10/16/23 0803     Visit Number 4    Number of Visits 16    Date for PT Re-Evaluation 11/20/23    Authorization Type Aet[employer]    PT Start Time 0801    PT Stop Time 0846    PT Time Calculation (min) 45 min    Activity Tolerance Patient tolerated treatment well    Behavior During Therapy [facility] for tasks assessed/performed             Past Medical History:  Diagnosis Date   Depression    Fibroid    Medical history non-contributory    Past Surgical History:  Procedure Laterality Date   CESAREAN SECTION N/A 09/10/2017   Procedure: CESAREAN SECTION;  Surgeon: Mitchel Honour, DO;  Location: [facility];  Service: Obstetrics;  Laterality: N/A;  Primary edc 09/16/17 NKDA [name] RNFA   CESAREAN SECTION N/A 08/29/2019   Procedure: CESAREAN SECTION;  Surgeon: Mitchel Honour, DO;  Location: [facility];  Service: Obstetrics;  Laterality: N/A;  REPEAT EDC 09/08/19 NKDA [name],  RNFA   WISDOM TOOTH EXTRACTION     Patient Active Problem List   Diagnosis Date Noted   Previous cesarean section 08/29/2019   S/P cesarean section 09/10/2017   Trauma during pregnancy 07/03/2017   Left shoulder pain 04/01/2016    PCP: Maryelizabeth Rowan MD  REFERRING PROVIDER: Andi Devon, DO   REFERRING DIAG: [phone] (ICD-10-CM) - Left hip pain   THERAPY DIAG:  Left hip pain  Weakness of left hip  Other abnormalities of gait and mobility  Rationale for Evaluation and Treatment: Rehabilitation  ONSET DATE: exacerbated last 4-5 months  SUBJECTIVE:   SUBJECTIVE STATEMENT: Pt reports not much difference in pain since aquatic therapy began nor since she had backed off of normal exercise program.  Focuses on core and UE strength indep.  Initial subjective Exercises 3-4 x week [employer]  training. Have been doing ok until last few months having trouble completing anything strenuous throughout left le. Pain started in my hip and now it is my left knee and ankle. Had a cortizone shot 10 years ago in hip due to an injury which made it better at the time. Has had 2 pregnancies since with c-sections.  Stopped running. Walking down step knee and ankle pop.  Seem to be getting better for a few months then just started getting worse as I ramped up weights  PERTINENT HISTORY: Evaluate and treat for left hip pain and glute weakness. Please include aquatic therapy and land based exercises in treatment plan. PAIN:  Are you having pain? Yes: NPRS scale: current 3.5/10; during exercise 6/10 Pain location: left glute and anterior hip Pain description: dull ache Aggravating factors: exercises dead lifts; squats  feels unstable Relieving factors: advil  PRECAUTIONS: None  RED FLAGS: None   WEIGHT BEARING RESTRICTIONS: No  FALLS:  Has patient fallen in last 6 months? No  LIVING ENVIRONMENT: Lives with: lives with their family Lives in: House/apartment Stairs: Yes: Internal: 16 steps; on right going up Has following equipment at home: None  OCCUPATION: seated job  PLOF: Independent  PATIENT GOALS: no pain return to run, don't want to hurt myself further  NEXT MD VISIT: 6-8 weeks/prn  OBJECTIVE:  Note: Objective measures were completed at Evaluation unless otherwise noted.  DIAGNOSTIC FINDINGS: DG Lumbar IMPRESSION: Negative.  PATIENT SURVEYS:  FOTO Primary score 69% with goal of 82%  COGNITION: Overall cognitive status: Within functional limits for tasks assessed     SENSATION: WFL   MUSCLE LENGTH: Hamstrings: wfl  POSTURE: No Significant postural limitations  PALPATION: TTP left iliopsoas/psoas; left Glut insertion   LOWER EXTREMITY ROM:  WFL  LOWER EXTREMITY MMT:  MMT Right eval Left eval  Hip flexion 49.1 45.2  Hip extension 5/5 4/5  Hip  abduction 22.6 23.9  Hip adduction    Hip internal rotation  4 no P!  Hip external rotation 5 4 P!  Knee flexion    Knee extension 50.9 53.4 P! Hip flex  Ankle dorsiflexion    Ankle plantarflexion    Ankle inversion    Ankle eversion     (Blank rows = not tested)  LOWER EXTREMITY SPECIAL TESTS:  Hip special tests: Luisa Hart (FABER) test: positive , Trendelenburg test: positive , Thomas test: negative, and SI distraction test: negative  FUNCTIONAL TESTS:  5 times sit to stand: 7.28   GAIT: Unlimited No visual trendelenburg with amb but tests positive with actual test.                                                                                                                                TREATMENT  Pt seen for aquatic therapy today.  Treatment took place in water 3.5-4.75 ft in depth at the [facility] pool. Temp of water was 91.  Pt entered/exited the pool via stair using alternating pattern with hand rail.  *walking unsupported forward/ backward with reciprocal arm swing with cues for vertical trunk * side stepping  *3 way LE stretch with ankle on solid noodle: hamstring,addct, and IT band x 15-20sec each  * side step into wide squat with UE on yellow hand floats  * forward walking split squats with UE on yellow hand floats - no pain just pressure * single leg superman x 5 each side (improved tolerance in L SLS);  hip circles cw&ccwx 8 each side * curtsy lunges with ue add/abd yellow HB *skipping x 6 widths 3.6 ft *hip hinging x 10 *hip flexor/ quad stretch with ankle on solid noodle    PATIENT EDUCATION:  Education details: Discussed eval findings, rehab rationale, aquatic program progression/POC and pools in area. Patient is in agreement  Person educated: Patient Education method: Explanation Education comprehension: verbalized understanding  HOME EXERCISE PROGRAM: Continue with indep exercise regimen with reduction of any activities that cause left  hip/knee/ankle pain  ASSESSMENT:  CLINICAL IMPRESSION: Pt demonstrates improved toleration to lunges and SLS on left LE.  She reports "normal" pain/discomfort with exercises.  She is cautioned to work only into slight discomfort.  Added some cardio with end range movements in hip abd and extension with fairly good tolerance (as of finishing session and while in process).  She reports discomfort at end of session "maybe  be little better".    Initial impression Patient is a 38 y.o. f who was seen today for physical therapy evaluation and treatment for Left hip pain. Pt reports old injury x 10 years ago which resolved after cortizone injection.  It has given her issue off and on over the past years but exacerbated in the last 4 months. She is an active exerciser and has 2 small children.  Reports she has continued with her exercise regimen backing off some when left hip/knee and ankle begin to hurt. She has stopped running altogether. Xray is neg for lumbar spine dysfunction.  She has positive Trendelenburg sign, demonstrates left glut weakness, does not appear to have SI involvement at todays assessment. Left iliopsoas/psoas muscle tightness present with palpation as well as some tenderness. 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  She will benefit from skilled physical therapy to improve left glut/hip complex strength to normalize bio mechanic stability.  Plan to spend some time in aquatics but majority of session land based.  OBJECTIVE IMPAIRMENTS: decreased activity tolerance, decreased strength, increased muscle spasms, and pain.   ACTIVITY LIMITATIONS: lifting, squatting, and stairs  PARTICIPATION LIMITATIONS:  normal exercise regimen  PERSONAL FACTORS: Fitness and Time since onset of injury/illness/exacerbation are also affecting patient's functional outcome.   REHAB POTENTIAL:  Good  CLINICAL DECISION MAKING: Evolving/moderate complexity  EVALUATION COMPLEXITY: Moderate   GOALS: Goals reviewed with patient? Yes  SHORT TERM GOALS: Target date: 10/12/23 Pt will report decreased pain with exercise submerged to <2/10 and in positions land based which cause pain Baseline:squats/hip flex, descending steps; 6/10 Goal status: INITIAL  2.  Pt will demonstrate indep with aquatic exercises and will make decision on best setting for exercise going forward.  Aquatic HEP to be assigned as approp. Baseline:  Goal status: INITIAL   LONG TERM GOALS: Target date: 11/20/23  Pt to meet stated Foto Goal of 82% Baseline: 69% Goal status: INITIAL  2.  Pt will tolerate running short distance (under 1 mile) Baseline:  Goal status: INITIAL  3.  Pt will descend 1 flight of steps without pain Baseline: pain Goal status: INITIAL  4.  Pt will improve strength in Left hip = to contralateral side to demonstrate improved biomechanical stability of hip Baseline:  Goal status: INITIAL  5.  Pt will return to indep regimen of exercises without limitation due to weakness or pain Baseline: limited Goal status: INITIAL  6.  Pt will amb without trendelenburg left hip Baseline: positive tendelenburg Goal status: INITIAL   PLAN:  PT FREQUENCY: 2x/week  PT DURATION: 8 weeks  PLANNED INTERVENTIONS: 97164- PT Re-evaluation, 97110-Therapeutic exercises, 97530- Therapeutic activity, 97112- Neuromuscular re-education, 97535- Self Care, 16109- Manual therapy, [phone]- Gait training, [phone]- Orthotic Fit/training, [phone]- Aquatic Therapy, 97014- Electrical stimulation (unattended), [phone]- Ionotophoresis 4mg /ml Dexamethasone, Patient/Family education, Balance training, Stair training, Taping, Dry Needling, Joint mobilization, DME instructions, Cryotherapy, and Moist heat  PLAN FOR NEXT SESSION: hip strengthening special attention to glutes, knee strengthening, gait training, land based  consider DN; stretching program special attention to hip flex, pain control.  [address] [place]) Devani Odonnel MPT 10/16/23 9:30 AM [facility] [address] [place], [place], [address] Phone: [phone]   Fax:  [phone]

## 2023-10-23 ENCOUNTER — Ambulatory Visit (HOSPITAL_BASED_OUTPATIENT_CLINIC_OR_DEPARTMENT_OTHER): Payer: Commercial Managed Care - PPO | Admitting: Physical Therapy

## 2023-10-23 ENCOUNTER — Encounter (HOSPITAL_BASED_OUTPATIENT_CLINIC_OR_DEPARTMENT_OTHER): Payer: Self-pay | Admitting: Physical Therapy

## 2023-10-23 DIAGNOSIS — R2689 Other abnormalities of gait and mobility: Secondary | ICD-10-CM | POA: Diagnosis not present

## 2023-10-23 DIAGNOSIS — R29898 Other symptoms and signs involving the musculoskeletal system: Secondary | ICD-10-CM

## 2023-10-23 DIAGNOSIS — M25552 Pain in left hip: Secondary | ICD-10-CM

## 2023-10-23 NOTE — Therapy (Signed)
OUTPATIENT PHYSICAL THERAPY LOWER EXTREMITY TREATMENT   Patient Name: Jasmine Conrad MRN: [id number] DOB:08-30-1986, 38 y.o., female Today's Date: 10/23/2023  END OF SESSION:  PT End of Session - 10/23/23 0804     Visit Number 5    Number of Visits 16    Date for PT Re-Evaluation 11/20/23    Authorization Type [employer]    PT Start Time 0805    PT Stop Time 0845    PT Time Calculation (min) 40 min    Activity Tolerance Patient tolerated treatment well    Behavior During Therapy [facility] for tasks assessed/performed              Past Medical History:  Diagnosis Date   Depression    Fibroid    Medical history non-contributory    Past Surgical History:  Procedure Laterality Date   CESAREAN SECTION N/A 09/10/2017   Procedure: CESAREAN SECTION;  Surgeon: Mitchel Honour, DO;  [facility]: [facility];  Service: Obstetrics;  Laterality: N/A;  Primary edc 09/16/17 NKDA [name] RNFA   CESAREAN SECTION N/A 08/29/2019   Procedure: CESAREAN SECTION;  Surgeon: Mitchel Honour, DO;  Location: [facility];  Service: Obstetrics;  Laterality: N/A;  REPEAT EDC 09/08/19 NKDA [name],  RNFA   WISDOM TOOTH EXTRACTION     Patient Active Problem List   Diagnosis Date Noted   Previous cesarean section 08/29/2019   S/P cesarean section 09/10/2017   Trauma during pregnancy 07/03/2017   Left shoulder pain 04/01/2016    PCP: Maryelizabeth Rowan MD  REFERRING PROVIDER: Andi Devon, DO   REFERRING DIAG: [id number] (ICD-10-CM) - Left hip pain   THERAPY DIAG:  Left hip pain  Weakness of left hip  Other abnormalities of gait and mobility  Rationale for Evaluation and Treatment: Rehabilitation  ONSET DATE: exacerbated last 4-5 months  SUBJECTIVE:   SUBJECTIVE STATEMENT: Pt reports maybe slight improvement in pain after last session.  Pain sitting on plane x 3 hours 5-6/10.  Initial subjective 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 Have been doing ok until last few months having  trouble completing anything strenuous throughout left le. Pain started in my hip and now it is my left knee and ankle. Had a cortizone shot 10 years ago in hip due to an injury which made it better at the time. Has had 2 pregnancies since with c-sections.  Stopped running. Walking down step knee and ankle pop.  Seem to be getting better for a few months then just started getting worse as I ramped up weights  PERTINENT HISTORY: Evaluate and treat for left hip pain and glute weakness. Please include aquatic therapy and land based exercises in treatment plan. PAIN:  Are you having pain? Yes: NPRS scale: current 2/10; during exercise 6/10 Pain location: left glute and anterior hip Pain description: dull ache Aggravating factors: exercises dead lifts; squats  feels unstable Relieving factors: advil  PRECAUTIONS: None  RED FLAGS: None   WEIGHT BEARING RESTRICTIONS: No  FALLS:  Has patient fallen in last 6 months? No  LIVING ENVIRONMENT: Lives with: lives with their family Lives in: House/apartment Stairs: Yes: Internal: 16 steps; on right going up Has following equipment at home: None  OCCUPATION: seated job  PLOF: Independent  PATIENT GOALS: no pain return to run, don't want to hurt myself further  NEXT MD VISIT: 6-8 weeks/prn  OBJECTIVE:  Note: Objective measures were completed at Evaluation unless otherwise noted.  DIAGNOSTIC FINDINGS: DG Lumbar IMPRESSION: Negative.  PATIENT SURVEYS:  FOTO Primary score 69% with goal of 82%  COGNITION: Overall cognitive status: Within functional limits for tasks assessed     SENSATION: WFL   MUSCLE LENGTH: Hamstrings: wfl  POSTURE: No Significant postural limitations  PALPATION: TTP left iliopsoas/psoas; left Glut insertion   LOWER EXTREMITY ROM:  WFL  LOWER EXTREMITY MMT:  MMT Right eval Left eval  Hip flexion 49.1 45.2  Hip extension 5/5 4/5  Hip abduction 22.6 23.9  Hip adduction    Hip internal rotation  4  no P!  Hip external rotation 5 4 P!  Knee flexion    Knee extension 50.9 53.4 P! Hip flex  Ankle dorsiflexion    Ankle plantarflexion    Ankle inversion    Ankle eversion     (Blank rows = not tested)  LOWER EXTREMITY SPECIAL TESTS:  Hip special tests: Luisa Hart (FABER) test: positive , Trendelenburg test: positive , Thomas test: negative, and SI distraction test: negative  FUNCTIONAL TESTS:  5 times sit to stand: 7.28   GAIT: Unlimited No visual trendelenburg with amb but tests positive with actual test.                                                                                                                                TREATMENT  Pt seen for aquatic therapy today.  Treatment took place in water 3.5-4.75 ft in depth at the [facility] pool. Temp of water was 91.  Pt entered/exited the pool via stair using alternating pattern with hand rail.  *walking unsupported forward/ backward with reciprocal arm swing with cues for vertical trunk * side stepping/skipping ("doesn't feel the same going left vs right, no pain) * side step into wide squat with UE on yellow hand floats add/and ue  *Plank using yellow HB with leg lifts 3 x 5 *side plank using blue HB (good challenge) leg lifts x 5 *SLS with opposite arm circles using hollow full then 1/2 noodle (for improved technique). *hip hinging x 10 *adductor set using orange BB  *STS from 3rd water step with add set x5 ->4th from bottom x5 * 3 way stretch using solid noodle (hamstring, gastroc, add and IT band) *quad/hip flex stretch using solid noodle  Pt requires the buoyancy and hydrostatic pressure of water for support, and to offload joints by unweighting joint load by at least 50 % in navel deep water and by at least 75-80% in chest to neck deep water.  Viscosity of the water is needed for resistance of strengthening. Water current perturbations provides challenge to standing balance requiring increased core  activation.     PATIENT EDUCATION:  Education details: Discussed eval findings, rehab rationale, aquatic program progression/POC and pools in area. Patient is in agreement  Person educated: Patient Education method: Explanation Education comprehension: verbalized understanding  HOME EXERCISE PROGRAM: Continue with indep exercise regimen with reduction of any activities that cause left hip/knee/ankle pain  ASSESSMENT:  CLINICAL IMPRESSION:  Pt has returned from vacation.  She reports increased pain in hip with 3 hour flight/sitting which resolves with movement. Her pain is mostly unchanged. She continues to have popping in left knee and ankle with stair climbing. She tolerates progression of aquatic exercises well adding more core engagement.  She is to begin land based next session.  No aquatic HEP indicated at this time.  Unless land based therapist determines necessary I do believe she has reach her max potential in aquatic setting. No real measurable progress noted with aquatic intervention.      Initial impression Patient is a 38 y.o. f who was seen today for physical therapy evaluation and treatment for Left hip pain. Pt reports old injury x 10 years ago which resolved after cortizone injection.  It has given her issue off and on over the past years but exacerbated in the last 4 months. She is an active exerciser and has 2 small children.  Reports she has continued with her exercise regimen backing off some when left hip/knee and ankle begin to hurt. She has stopped running altogether. Xray is neg for lumbar spine dysfunction.  She has positive Trendelenburg sign, demonstrates left glut weakness, does not appear to have SI involvement at todays assessment. Left iliopsoas/psoas muscle tightness present with palpation as well as some tenderness. 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  She will benefit from skilled physical therapy to improve left glut/hip complex strength to normalize bio mechanic stability.  Plan to spend some time in aquatics but majority of session land based.  OBJECTIVE IMPAIRMENTS: decreased activity tolerance, decreased strength, increased muscle spasms, and pain.   ACTIVITY LIMITATIONS: lifting, squatting, and stairs  PARTICIPATION LIMITATIONS:  normal exercise regimen  PERSONAL FACTORS: Fitness and Time since onset of injury/illness/exacerbation are also affecting patient's functional outcome.   REHAB POTENTIAL: Good  CLINICAL DECISION MAKING: Evolving/moderate complexity  EVALUATION COMPLEXITY: Moderate   GOALS: Goals reviewed with patient? Yes  SHORT TERM GOALS: Target date: 10/12/23 Pt will report decreased pain with exercise submerged to <2/10 and in positions land based which cause pain Baseline:squats/hip flex, descending steps; 6/10 Goal status: NOT MET 10/23/23  2.  Pt will demonstrate indep with aquatic exercises and will make decision on best setting for exercise going forward.  Aquatic HEP to be assigned as approp. Baseline:  Goal status: met 10/23/23   LONG TERM GOALS: Target date: 11/20/23  Pt to meet stated Foto Goal of 82% Baseline: 69% Goal status: INITIAL  2.  Pt will tolerate running short distance (under 1 mile) Baseline:  Goal status: INITIAL  3.  Pt will descend 1 flight of steps without pain Baseline: pain Goal status: INITIAL  4.  Pt will improve strength in Left hip = to contralateral side to demonstrate improved biomechanical stability of hip Baseline:  Goal status: INITIAL  5.  Pt will return to indep regimen of exercises without limitation due to weakness or pain Baseline: limited Goal status: INITIAL  6.  Pt will amb without trendelenburg left hip Baseline: positive tendelenburg Goal status: INITIAL   PLAN:  PT FREQUENCY: 2x/week  PT DURATION: 8 weeks  PLANNED INTERVENTIONS:  97164- PT Re-evaluation, 97110-Therapeutic exercises, 97530- Therapeutic activity, 97112- Neuromuscular re-education, 97535- Self Care, 40981- Manual therapy, [id number]- Gait training, [phone]- Orthotic Fit/training, [phone]- Aquatic Therapy, 97014- Electrical stimulation (unattended), [phone]- Ionotophoresis 4mg /ml Dexamethasone, Patient/Family education, Balance  training, Stair training, Taping, Dry Needling, Joint mobilization, DME instructions, Cryotherapy, and Moist heat  PLAN FOR NEXT SESSION: hip strengthening special attention to glutes, knee strengthening, gait training, land based consider DN; stretching program special attention to hip flex, pain control.  [address] [place]) Tyshun Tuckerman MPT 10/23/23 8:09 AM [facility] [address] [place], [place], [address] Phone: [phone]   Fax:  [phone]

## 2023-10-27 ENCOUNTER — Ambulatory Visit (HOSPITAL_BASED_OUTPATIENT_CLINIC_OR_DEPARTMENT_OTHER): Payer: Commercial Managed Care - PPO | Admitting: Physical Therapy

## 2023-10-27 ENCOUNTER — Encounter (HOSPITAL_BASED_OUTPATIENT_CLINIC_OR_DEPARTMENT_OTHER): Payer: Self-pay | Admitting: Physical Therapy

## 2023-10-27 DIAGNOSIS — R2689 Other abnormalities of gait and mobility: Secondary | ICD-10-CM | POA: Diagnosis not present

## 2023-10-27 DIAGNOSIS — M25552 Pain in left hip: Secondary | ICD-10-CM | POA: Diagnosis not present

## 2023-10-27 DIAGNOSIS — R29898 Other symptoms and signs involving the musculoskeletal system: Secondary | ICD-10-CM

## 2023-10-27 NOTE — Therapy (Signed)
 OUTPATIENT PHYSICAL THERAPY LOWER EXTREMITY TREATMENT   Patient Name: Jasmine Conrad MRN: [id number] DOB:1985/09/26, 38 y.o., female Today's Date: 10/27/2023  END OF SESSION:  PT End of Session - 10/27/23 1221     Visit Number 6    Number of Visits 16    Date for PT Re-Evaluation 11/20/23    Authorization Type [employer]et[employer]    PT Start Time [phone]    PT Stop Time 0925    PT Time Calculation (min) 38 min    Activity Tolerance Patient tolerated treatment well    Behavior During Therapy [facility] for tasks assessed/performed               Past Medical History:  Diagnosis Date   Depression    Fibroid    Medical history non-contributory    Past Surgical History:  Procedure Laterality Date   CESAREAN SECTION N/A 09/10/2017   Procedure: CESAREAN SECTION;  Surgeon: Mitchel Honour, DO;  Location: [facility];  Service: Obstetrics;  Laterality: N/A;  Primary edc 09/16/17 NKDA [name] RNFA   CESAREAN SECTION N/A 08/29/2019   Procedure: CESAREAN SECTION;  Surgeon: Mitchel Honour, DO;  Location: [facility];  Service: Obstetrics;  Laterality: N/A;  REPEAT EDC 09/08/19 NKDA [name],  RNFA   WISDOM TOOTH EXTRACTION     Patient Active Problem List   Diagnosis Date Noted   Previous cesarean section 08/29/2019   S/P cesarean section 09/10/2017   Trauma during pregnancy 07/03/2017   Left shoulder pain 04/01/2016    PCP: Maryelizabeth Rowan MD  REFERRING PROVIDER: Andi Devon, DO   REFERRING DIAG: [phone] (ICD-10-CM) - Left hip pain   THERAPY DIAG:  Left hip pain  Weakness of left hip  Other abnormalities of gait and mobility  Rationale for Evaluation and Treatment: Rehabilitation  ONSET DATE: exacerbated last 4-5 months  SUBJECTIVE:   SUBJECTIVE STATEMENT: The patient reports it is about the same. She continues to have soreness.   Initial subjective 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 Have been doing ok until last few months having trouble completing anything  strenuous throughout left le. Pain started in my hip and now it is my left knee and ankle. Had a cortizone shot 10 years ago in hip due to an injury which made it better at the time. Has had 2 pregnancies since with c-sections.  Stopped running. Walking down step knee and ankle pop.  Seem to be getting better for a few months then just started getting worse as I ramped up weights  PERTINENT HISTORY: Evaluate and treat for left hip pain and glute weakness. Please include aquatic therapy and land based exercises in treatment plan. PAIN:  Are you having pain? Yes: NPRS scale: current 2/10; during exercise 6/10 Pain location: left glute and anterior hip Pain description: dull ache Aggravating factors: exercises dead lifts; squats  feels unstable Relieving factors: advil  PRECAUTIONS: None  RED FLAGS: None   WEIGHT BEARING RESTRICTIONS: No  FALLS:  Has patient fallen in last 6 months? No  LIVING ENVIRONMENT: Lives with: lives with their family Lives in: House/apartment Stairs: Yes: Internal: 16 steps; on right going up Has following equipment at home: None  OCCUPATION: seated job  PLOF: Independent  PATIENT GOALS: no pain return to run, don't want to hurt myself further  NEXT MD VISIT: 6-8 weeks/prn  OBJECTIVE:  Note: Objective measures were completed at Evaluation unless otherwise noted.  DIAGNOSTIC FINDINGS: DG Lumbar IMPRESSION: Negative.  PATIENT SURVEYS:  FOTO Primary score  69% with goal of 82%  COGNITION: Overall cognitive status: Within functional limits for tasks assessed     SENSATION: WFL   MUSCLE LENGTH: Hamstrings: wfl  POSTURE: No Significant postural limitations  PALPATION: TTP left iliopsoas/psoas; left Glut insertion   LOWER EXTREMITY ROM:  WFL  LOWER EXTREMITY MMT:  MMT Right eval Left eval  Hip flexion 49.1 45.2  Hip extension 5/5 4/5  Hip abduction 22.6 23.9  Hip adduction    Hip internal rotation  4 no P!  Hip external  rotation 5 4 P!  Knee flexion    Knee extension 50.9 53.4 P! Hip flex  Ankle dorsiflexion    Ankle plantarflexion    Ankle inversion    Ankle eversion     (Blank rows = not tested)  LOWER EXTREMITY SPECIAL TESTS:  Hip special tests: Luisa Hart (FABER) test: positive , Trendelenburg test: positive , Thomas test: negative, and SI distraction test: negative  FUNCTIONAL TESTS:  5 times sit to stand: 7.28   GAIT: Unlimited No visual trendelenburg with amb but tests positive with actual test.                                                                                                                                TREATMENT  2/25 Manual:   Assessed painful ROM and perfroemd hip labral special tests. + for both tests.  Skilled palpation of trigger points  Reviewed self soft tissue mobilization with a roller   There-ex: Gluteal stretch 3x20 sec hold  Thomas stretch 3x20 sec hold  LAQ with Green ban 3x12 RPE of 5-6   Created and reviewed HEP and h to use   There-Act  Self Care  Nuro-Re-ed   Bridge with band 3x12 green band RPE of 4  Lateral band walk RPE of 6 with red 3x10    Last visit:  Pt seen for aquatic therapy today.  Treatment took place in water 3.5-4.75 ft in depth at the [facility] pool. Temp of water was 91.  Pt entered/exited the pool via stair using alternating pattern with hand rail.  *walking unsupported forward/ backward with reciprocal arm swing with cues for vertical trunk * side stepping/skipping ("doesn't feel the same going left vs right, no pain) * side step into wide squat with UE on yellow hand floats add/and ue  *Plank using yellow HB with leg lifts 3 x 5 *side plank using blue HB (good challenge) leg lifts x 5 *SLS with opposite arm circles using hollow full then 1/2 noodle (for improved technique). *hip hinging x 10 *adductor set using orange BB  *STS from 3rd water step with add set x5 ->4th from bottom x5 * 3 way stretch using  solid noodle (hamstring, gastroc, add and IT band) *quad/hip flex stretch using solid noodle  Pt requires the buoyancy and hydrostatic pressure of water for support, and to offload joints by unweighting joint load by at least 50 % in  navel deep water and by at least 75-80% in chest to neck deep water.  Viscosity of the water is needed for resistance of strengthening. Water current perturbations provides challenge to standing balance requiring increased core activation.     PATIENT EDUCATION:  Education details: Discussed eval findings, rehab rationale, aquatic program progression/POC and pools in area. Patient is in agreement  Person educated: Patient Education method: Explanation Education comprehension: verbalized understanding  HOME EXERCISE PROGRAM: Continue with indep exercise regimen with reduction of any activities that cause left hip/knee/ankle pain  ASSESSMENT:  CLINICAL IMPRESSION: Patient tolerated treatment well.  She has significant anterior hip tightness.  She does have pain with endrange combined hip movements including flexion ER and extension IR.  We gave her low range moderate intensity hip strengthening exercises to try at home.  We also gave her quad strengthening exercises to work on at home.  She is advised to avoid any exercises that cause her significant pain during her workouts otherwise.  She was given an HEP to work on at home including anterior hip stretching and self soft tissue mobilization.  We will reassess her program next visit.     Initial impression Patient is a 38 y.o. f who was seen today for physical therapy evaluation and treatment for Left hip pain. Pt reports old injury x 10 years ago which resolved after cortizone injection.  It has given her issue off and on over the past years but exacerbated in the last 4 months. She is an active exerciser and has 2 small children.  Reports she has continued with her exercise regimen backing off some when left  hip/knee and ankle begin to hurt. She has stopped running altogether. Xray is neg for lumbar spine dysfunction.  She has positive Trendelenburg sign, demonstrates left glut weakness, does not appear to have SI involvement at todays assessment. Left iliopsoas/psoas muscle tightness present with palpation as well as some tenderness. 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  She will benefit from skilled physical therapy to improve left glut/hip complex strength to normalize bio mechanic stability.  Plan to spend some time in aquatics but majority of session land based.  OBJECTIVE IMPAIRMENTS: decreased activity tolerance, decreased strength, increased muscle spasms, and pain.   ACTIVITY LIMITATIONS: lifting, squatting, and stairs  PARTICIPATION LIMITATIONS:  normal exercise regimen  PERSONAL FACTORS: Fitness and Time since onset of injury/illness/exacerbation are also affecting patient's functional outcome.   REHAB POTENTIAL: Good  CLINICAL DECISION MAKING: Evolving/moderate complexity  EVALUATION COMPLEXITY: Moderate   GOALS: Goals reviewed with patient? Yes  SHORT TERM GOALS: Target date: 10/12/23 Pt will report decreased pain with exercise submerged to <2/10 and in positions land based which cause pain Baseline:squats/hip flex, descending steps; 6/10 Goal status: NOT MET 10/23/23  2.  Pt will demonstrate indep with aquatic exercises and will make decision on best setting for exercise going forward.  Aquatic HEP to be assigned as approp. Baseline:  Goal status: met 10/23/23   LONG TERM GOALS: Target date: 11/20/23  Pt to meet stated Foto Goal of 82% Baseline: 69% Goal status: INITIAL  2.  Pt will tolerate running short distance (under 1 mile) Baseline:  Goal status: INITIAL  3.  Pt will descend 1 flight of steps without pain Baseline: pain Goal status:  INITIAL  4.  Pt will improve strength in Left hip = to contralateral side  to demonstrate improved biomechanical stability of hip Baseline:  Goal status: INITIAL  5.  Pt will return to indep regimen of exercises without limitation due to weakness or pain Baseline: limited Goal status: INITIAL  6.  Pt will amb without trendelenburg left hip Baseline: positive tendelenburg Goal status: INITIAL   PLAN:  PT FREQUENCY: 2x/week  PT DURATION: 8 weeks  PLANNED INTERVENTIONS: 97164- PT Re-evaluation, 97110-Therapeutic exercises, 97530- Therapeutic activity, 97112- Neuromuscular re-education, 97535- Self Care, 08657- Manual therapy, [phone]- Gait training, [phone]- Orthotic Fit/training, [phone]- Aquatic Therapy, 97014- Electrical stimulation (unattended), [phone]- Ionotophoresis 4mg /ml Dexamethasone, Patient/Family education, Balance training, Stair training, Taping, Dry Needling, Joint mobilization, DME instructions, Cryotherapy, and Moist heat  PLAN FOR NEXT SESSION: hip strengthening special attention to glutes, knee strengthening, gait training, land based consider DN; stretching program special attention to hip flex, pain control.  Lorayne Bender PT DPT 10/27/23 1:54 PM [facility] [address] [place], [place], [address] Phone: [phone]   Fax:  [phone]

## 2023-10-29 ENCOUNTER — Encounter (HOSPITAL_BASED_OUTPATIENT_CLINIC_OR_DEPARTMENT_OTHER): Payer: Self-pay | Admitting: Physical Therapy

## 2023-10-29 ENCOUNTER — Ambulatory Visit (HOSPITAL_BASED_OUTPATIENT_CLINIC_OR_DEPARTMENT_OTHER): Payer: Commercial Managed Care - PPO | Admitting: Physical Therapy

## 2023-10-29 DIAGNOSIS — R2689 Other abnormalities of gait and mobility: Secondary | ICD-10-CM | POA: Diagnosis not present

## 2023-10-29 DIAGNOSIS — M25552 Pain in left hip: Secondary | ICD-10-CM

## 2023-10-29 DIAGNOSIS — R29898 Other symptoms and signs involving the musculoskeletal system: Secondary | ICD-10-CM | POA: Diagnosis not present

## 2023-10-29 NOTE — Therapy (Signed)
 OUTPATIENT PHYSICAL THERAPY LOWER EXTREMITY TREATMENT   Patient Name: Jasmine Conrad MRN: [id number] DOB:1986-02-20, 38 y.o., female Today's Date: 10/29/2023  END OF SESSION:  PT End of Session - 10/29/23 0855     Visit Number 7    Number of Visits 16    Date for PT Re-Evaluation 11/20/23    Authorization Type Aet[employer]    PT Start Time [phone]    PT Stop Time 0931    PT Time Calculation (min) 38 min               Past Medical History:  Diagnosis Date   Depression    Fibroid    Medical history non-contributory    Past Surgical History:  Procedure Laterality Date   CESAREAN SECTION N/A 09/10/2017   Procedure: CESAREAN SECTION;  Surgeon: Mitchel Honour, DO;  [facility]: [facility];  Service: Obstetrics;  Laterality: N/A;  Primary edc 09/16/17 NKDA [name] RNFA   CESAREAN SECTION N/A 08/29/2019   Procedure: CESAREAN SECTION;  Surgeon: Mitchel Honour, DO;  Location: [facility];  Service: Obstetrics;  Laterality: N/A;  REPEAT EDC 09/08/19 NKDA [name],  RNFA   WISDOM TOOTH EXTRACTION     Patient Active Problem List   Diagnosis Date Noted   Previous cesarean section 08/29/2019   S/P cesarean section 09/10/2017   Trauma during pregnancy 07/03/2017   Left shoulder pain 04/01/2016    PCP: Maryelizabeth Rowan MD  REFERRING PROVIDER: Andi Devon, DO   REFERRING DIAG: [id number] (ICD-10-CM) - Left hip pain   THERAPY DIAG:  Left hip pain  Weakness of left hip  Other abnormalities of gait and mobility  Rationale for Evaluation and Treatment: Rehabilitation  ONSET DATE: exacerbated last 4-5 months  SUBJECTIVE:   SUBJECTIVE STATEMENT: The patient reports good tolerance for HEP exercises.    Initial subjective 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 Have been doing ok until last few months having trouble completing anything strenuous throughout left le. Pain started in my hip and now it is my left knee and ankle. Had a cortizone shot 10 years ago in hip due to an  injury which made it better at the time. Has had 2 pregnancies since with c-sections.  Stopped running. Walking down step knee and ankle pop.  Seem to be getting better for a few months then just started getting worse as I ramped up weights  PERTINENT HISTORY: Evaluate and treat for left hip pain and glute weakness. Please include aquatic therapy and land based exercises in treatment plan. PAIN:  Are you having pain? Yes: NPRS scale: current 2.5/10 Pain location: left glute and anterior hip Pain description: dull ache Aggravating factors: exercises dead lifts; squats  feels unstable Relieving factors: advil  PRECAUTIONS: None  RED FLAGS: None   WEIGHT BEARING RESTRICTIONS: No  FALLS:  Has patient fallen in last 6 months? No  LIVING ENVIRONMENT: Lives with: lives with their family Lives in: House/apartment Stairs: Yes: Internal: 16 steps; on right going up Has following equipment at home: None  OCCUPATION: seated job  PLOF: Independent  PATIENT GOALS: no pain return to run, don't want to hurt myself further  NEXT MD VISIT: 6-8 weeks/prn  OBJECTIVE:  Note: Objective measures were completed at Evaluation unless otherwise noted.  DIAGNOSTIC FINDINGS: DG Lumbar IMPRESSION: Negative.  PATIENT SURVEYS:  FOTO Primary score 69% with goal of 82%  COGNITION: Overall cognitive status: Within functional limits for tasks assessed     SENSATION: WFL   MUSCLE LENGTH:  Hamstrings: wfl  POSTURE: No Significant postural limitations  PALPATION: TTP left iliopsoas/psoas; left Glut insertion   LOWER EXTREMITY ROM:  WFL  LOWER EXTREMITY MMT:  MMT Right eval Left eval  Hip flexion 49.1 45.2  Hip extension 5/5 4/5  Hip abduction 22.6 23.9  Hip adduction    Hip internal rotation  4 no P!  Hip external rotation 5 4 P!  Knee flexion    Knee extension 50.9 53.4 P! Hip flex  Ankle dorsiflexion    Ankle plantarflexion    Ankle inversion    Ankle eversion     (Blank  rows = not tested)  LOWER EXTREMITY SPECIAL TESTS:  Hip special tests: Luisa Hart (FABER) test: positive , Trendelenburg test: positive , Thomas test: negative, and SI distraction test: negative  FUNCTIONAL TESTS:  5 times sit to stand: 7.28   GAIT: Unlimited No visual trendelenburg with amb but tests positive with actual test.                                                                                                                                TREATMENT  [facility] Adult PT Treatment:                                                DATE: 10/29/23  Therapeutic Exercise: Upright bike, L2, x 6 min Lt Thomas stretch 3 x 30s (3rd rep holding R knee to chest) Butterfly stretch 2x 30s Trial of windshield wiper stretch x 2 each  Seated knee ext with green band x 10 Repeat of Thomas stretch x 30 sec (after IASTM) Standing L quad stretch x 20s Standing L hamstring stretch x 20s (foot on low surface)  Manual Therapy: IASTM to L ant/lateral thigh (mid to proximal) to decrease fascial restrictions  Neuromuscular re-ed: Lateral band walk  red band on thighs,(side step 3-4 R/L x 10)  2x10  Bridge with green band and thighs, 3 sec pause, x 10, 2 sets  2/25 Manual:   Assessed painful ROM and perfroemd hip labral special tests. + for both tests.  Skilled palpation of trigger points  Reviewed self soft tissue mobilization with a roller   There-ex: Gluteal stretch 3x20 sec hold  Thomas stretch 3x20 sec hold  LAQ with Green ban 3x12 RPE of 5-6   Created and reviewed HEP and how to use   There-Act  Self Care  Nuro-Re-ed   Bridge with band 3x12 green band RPE of 4  Lateral band walk RPE of 6 with red 3x10    Last visit:  Pt seen for aquatic therapy today.  Treatment took place in water 3.5-4.75 ft in depth at the [facility] pool. Temp of water was 91.  Pt entered/exited the pool via stair using alternating pattern with hand rail.  *walking unsupported  forward/ backward  with reciprocal arm swing with cues for vertical trunk * side stepping/skipping ("doesn't feel the same going left vs right, no pain) * side step into wide squat with UE on yellow hand floats add/and ue  *Plank using yellow HB with leg lifts 3 x 5 *side plank using blue HB (good challenge) leg lifts x 5 *SLS with opposite arm circles using hollow full then 1/2 noodle (for improved technique). *hip hinging x 10 *adductor set using orange BB  *STS from 3rd water step with add set x5 ->4th from bottom x5 * 3 way stretch using solid noodle (hamstring, gastroc, add and IT band) *quad/hip flex stretch using solid noodle  Pt requires the buoyancy and hydrostatic pressure of water for support, and to offload joints by unweighting joint load by at least 50 % in navel deep water and by at least 75-80% in chest to neck deep water.  Viscosity of the water is needed for resistance of strengthening. Water current perturbations provides challenge to standing balance requiring increased core activation.  PATIENT EDUCATION:  Education details: Discussed eval findings, rehab rationale, aquatic program progression/POC and pools in area. Patient is in agreement  Person educated: Patient Education method: Explanation Education comprehension: verbalized understanding  HOME EXERCISE PROGRAM:   ASSESSMENT:  CLINICAL IMPRESSION: Good tolerance for upright bike (with upright posture/ hand Not on handle bars).  Improved tolerance for thomas stretch with hugging R knee.  Palpable tightness in L TFL and mid quadriceps muscle; reduced after IASTM/ STM.  Progressing gradually towards LTGs.      Initial impression Patient is a 38 y.o. f who was seen today for physical therapy evaluation and treatment for Left hip pain. Pt reports old injury x 10 years ago which resolved after cortizone injection.  It has given her issue off and on over the past years but exacerbated in the last 4 months. She is an active exerciser  and has 2 small children.  Reports she has continued with her exercise regimen backing off some when left hip/knee and ankle begin to hurt. She has stopped running altogether. Xray is neg for lumbar spine dysfunction.  She has positive Trendelenburg sign, demonstrates left glut weakness, does not appear to have SI involvement at todays assessment. Left iliopsoas/psoas muscle tightness present with palpation as well as some tenderness. 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  She will benefit from skilled physical therapy to improve left glut/hip complex strength to normalize bio mechanic stability.  Plan to spend some time in aquatics but majority of session land based.  OBJECTIVE IMPAIRMENTS: decreased activity tolerance, decreased strength, increased muscle spasms, and pain.   ACTIVITY LIMITATIONS: lifting, squatting, and stairs  PARTICIPATION LIMITATIONS:  normal exercise regimen  PERSONAL FACTORS: Fitness and Time since onset of injury/illness/exacerbation are also affecting patient's functional outcome.   REHAB POTENTIAL: Good  CLINICAL DECISION MAKING: Evolving/moderate complexity  EVALUATION COMPLEXITY: Moderate   GOALS: Goals reviewed with patient? Yes  SHORT TERM GOALS: Target date: 10/12/23 Pt will report decreased pain with exercise submerged to <2/10 and in positions land based which cause pain Baseline:squats/hip flex, descending steps; 6/10 Goal status: NOT MET 10/23/23  2.  Pt will demonstrate indep with aquatic exercises and will make decision on best setting for exercise going forward.  Aquatic HEP to be assigned as approp. Baseline:  Goal status: met 10/23/23  LONG TERM GOALS: Target date: 11/20/23  Pt to meet stated Foto Goal of 82% Baseline: 69% Goal status: INITIAL  2.  Pt will tolerate running short distance (under 1 mile) Baseline:  Goal  status: INITIAL  3.  Pt will descend 1 flight of steps without pain Baseline: pain Goal status: INITIAL  4.  Pt will improve strength in Left hip = to contralateral side to demonstrate improved biomechanical stability of hip Baseline:  Goal status: INITIAL  5.  Pt will return to indep regimen of exercises without limitation due to weakness or pain Baseline: limited Goal status: INITIAL  6.  Pt will amb without trendelenburg left hip Baseline: positive tendelenburg Goal status: INITIAL   PLAN:  PT FREQUENCY: 2x/week  PT DURATION: 8 weeks  PLANNED INTERVENTIONS: 97164- PT Re-evaluation, 97110-Therapeutic exercises, 97530- Therapeutic activity, 97112- Neuromuscular re-education, 97535- Self Care, 16109- Manual therapy, [id number]- Gait training, [phone]- Orthotic Fit/training, [phone]- Aquatic Therapy, 97014- Electrical stimulation (unattended), [phone]- Ionotophoresis 4mg /ml Dexamethasone, Patient/Family education, Balance training, Stair training, Taping, Dry Needling, Joint mobilization, DME instructions, Cryotherapy, and Moist heat  PLAN FOR NEXT SESSION: hip strengthening special attention to glutes, knee strengthening, gait training, land based consider DN; stretching program special attention to hip flex, pain control.   Mayer Camel, PTA 10/29/23 10:58 AM [facility] [address] [place], [place], [address] Phone: [phone]   Fax:  [phone]

## 2023-11-02 ENCOUNTER — Ambulatory Visit (HOSPITAL_BASED_OUTPATIENT_CLINIC_OR_DEPARTMENT_OTHER): Payer: Commercial Managed Care - PPO | Attending: Family Medicine | Admitting: Physical Therapy

## 2023-11-02 ENCOUNTER — Encounter (HOSPITAL_BASED_OUTPATIENT_CLINIC_OR_DEPARTMENT_OTHER): Payer: Self-pay | Admitting: Physical Therapy

## 2023-11-02 DIAGNOSIS — R29898 Other symptoms and signs involving the musculoskeletal system: Secondary | ICD-10-CM | POA: Diagnosis not present

## 2023-11-02 DIAGNOSIS — M25552 Pain in left hip: Secondary | ICD-10-CM | POA: Diagnosis not present

## 2023-11-02 DIAGNOSIS — R2689 Other abnormalities of gait and mobility: Secondary | ICD-10-CM | POA: Diagnosis not present

## 2023-11-02 NOTE — Therapy (Signed)
 OUTPATIENT PHYSICAL THERAPY LOWER EXTREMITY TREATMENT   Patient Name: Jasmine Conrad MRN: [id number] DOB:July 20, 1986, 38 y.o., female Today's Date: 11/02/2023  END OF SESSION:  PT End of Session - 11/02/23 0835     Visit Number 8    Number of Visits 16    Date for PT Re-Evaluation 11/20/23    Authorization Type Aetna    PT Start Time 0801    PT Stop Time 0845    PT Time Calculation (min) 44 min    Activity Tolerance Patient tolerated treatment well    Behavior During Therapy [facility] for tasks assessed/performed                Past Medical History:  Diagnosis Date   Depression    Fibroid    Medical history non-contributory    Past Surgical History:  Procedure Laterality Date   CESAREAN SECTION N/A 09/10/2017   Procedure: CESAREAN SECTION;  Surgeon: Mitchel Honour, DO;  Location: [facility];  Service: Obstetrics;  Laterality: N/A;  Primary edc 09/16/17 NKDA [name] RNFA   CESAREAN SECTION N/A 08/29/2019   Procedure: CESAREAN SECTION;  Surgeon: Mitchel Honour, DO;  Location: [facility];  Service: Obstetrics;  Laterality: N/A;  REPEAT EDC 09/08/19 NKDA [name],  RNFA   WISDOM TOOTH EXTRACTION     Patient Active Problem List   Diagnosis Date Noted   Previous cesarean section 08/29/2019   S/P cesarean section 09/10/2017   Trauma during pregnancy 07/03/2017   Left shoulder pain 04/01/2016    PCP: Maryelizabeth Rowan MD  REFERRING PROVIDER: Andi Devon, DO   REFERRING DIAG: [phone] (ICD-10-CM) - Left hip pain   THERAPY DIAG:  Left hip pain  Weakness of left hip  Other abnormalities of gait and mobility  Rationale for Evaluation and Treatment: Rehabilitation  ONSET DATE: exacerbated last 4-5 months  SUBJECTIVE:   SUBJECTIVE STATEMENT: The patient reports she felt pretty good after the last visit for a few days but then the pain came back. She went t [facility] yesterday and modified her workout.   Initial subjective Exercises 3-4 x week [facility]  training. Have been doing ok until last few months having trouble completing anything strenuous throughout left le. Pain started in my hip and now it is my left knee and ankle. Had a cortizone shot 10 years ago in hip due to an injury which made it better at the time. Has had 2 pregnancies since with c-sections.  Stopped running. Walking down step knee and ankle pop.  Seem to be getting better for a few months then just started getting worse as I ramped up weights  PERTINENT HISTORY: Evaluate and treat for left hip pain and glute weakness. Please include aquatic therapy and land based exercises in treatment plan. PAIN:  Are you having pain? Yes: NPRS scale: current 2.5/10 Pain location: left glute and anterior hip Pain description: dull ache Aggravating factors: exercises dead lifts; squats  feels unstable Relieving factors: advil  PRECAUTIONS: None  RED FLAGS: None   WEIGHT BEARING RESTRICTIONS: No  FALLS:  Has patient fallen in last 6 months? No  LIVING ENVIRONMENT: Lives with: lives with their family Lives in: House/apartment Stairs: Yes: Internal: 16 steps; on right going up Has following equipment at home: None  OCCUPATION: seated job  PLOF: Independent  PATIENT GOALS: no pain return to run, don't want to hurt myself further  NEXT MD VISIT: 6-8 weeks/prn  OBJECTIVE:  Note: Objective measures were completed at Evaluation unless otherwise  noted.  DIAGNOSTIC FINDINGS: DG Lumbar IMPRESSION: Negative.  PATIENT SURVEYS:  FOTO Primary score 69% with goal of 82%  COGNITION: Overall cognitive status: Within functional limits for tasks assessed     SENSATION: WFL   MUSCLE LENGTH: Hamstrings: wfl  POSTURE: No Significant postural limitations  PALPATION: TTP left iliopsoas/psoas; left Glut insertion   LOWER EXTREMITY ROM:  WFL  LOWER EXTREMITY MMT:  MMT Right eval Left eval  Hip flexion 49.1 45.2  Hip extension 5/5 4/5  Hip abduction 22.6 23.9  Hip  adduction    Hip internal rotation  4 no P!  Hip external rotation 5 4 P!  Knee flexion    Knee extension 50.9 53.4 P! Hip flex  Ankle dorsiflexion    Ankle plantarflexion    Ankle inversion    Ankle eversion     (Blank rows = not tested)  LOWER EXTREMITY SPECIAL TESTS:  Hip special tests: Luisa Hart (FABER) test: positive , Trendelenburg test: positive , Thomas test: negative, and SI distraction test: negative  FUNCTIONAL TESTS:  5 times sit to stand: 7.28   GAIT: Unlimited No visual trendelenburg with amb but tests positive with actual test.                                                                                                                                TREATMENT   3/3 Therapeutic Exercise: Upright bike, L2, x 6 min Lt Thomas stretch 2 x 30s (3rd rep holding R knee to chest) L Knee extension machine 3x10 15 lbs  Repeat of Thomas stretch x 30 sec (after IASTM)  Manual Therapy: IASTM to L ant/lateral thigh (mid to proximal) to decrease fascial restrictions  Neuromuscular re-ed: Supine march with TA contraction 3x10. Also reviewed progression Bridge with green band and thighs, 3 sec pause, x 10, 2 sets Eccentric step down 2x10 2 inch left x10 with right for comparison  Cable drill 2x5 15 lbs for control of the left hip       [facility] Adult PT Treatment:                                                DATE: 10/29/23  Therapeutic Exercise: Upright bike, L2, x 6 min Lt Thomas stretch 3 x 30s (3rd rep holding R knee to chest) Butterfly stretch 2x 30s Trial of windshield wiper stretch x 2 each  Seated knee ext with green band x 10 Repeat of Thomas stretch x 30 sec (after IASTM) Standing L quad stretch x 20s Standing L hamstring stretch x 20s (foot on low surface)  Manual Therapy: IASTM to L ant/lateral thigh (mid to proximal) to decrease fascial restrictions  Neuromuscular re-ed: Lateral band walk  red band on thighs,(side step 3-4 R/L x 10)  2x10  Bridge  with green band  and thighs, 3 sec pause, x 10, 2 sets  2/25 Manual:   Assessed painful ROM and perfroemd hip labral special tests. + for both tests.  Skilled palpation of trigger points  Reviewed self soft tissue mobilization with a roller   There-ex: Gluteal stretch 3x20 sec hold  Thomas stretch 3x20 sec hold  LAQ with Green ban 3x12 RPE of 5-6   Created and reviewed HEP and how to use    Nuro-Re-ed   Bridge with band 3x12 green band RPE of 4  Lateral band walk RPE of 6 with red 3x10    Last visit:  Pt seen for aquatic therapy today.  Treatment took place in water 3.5-4.75 ft in depth at the [facility] pool. Temp of water was 91.  Pt entered/exited the pool via stair using alternating pattern with hand rail.  *walking unsupported forward/ backward with reciprocal arm swing with cues for vertical trunk * side stepping/skipping ("doesn't feel the same going left vs right, no pain) * side step into wide squat with UE on yellow hand floats add/and ue  *Plank using yellow HB with leg lifts 3 x 5 *side plank using blue HB (good challenge) leg lifts x 5 *SLS with opposite arm circles using hollow full then 1/2 noodle (for improved technique). *hip hinging x 10 *adductor set using orange BB  *STS from 3rd water step with add set x5 ->4th from bottom x5 * 3 way stretch using solid noodle (hamstring, gastroc, add and IT band) *quad/hip flex stretch using solid noodle  Pt requires the buoyancy and hydrostatic pressure of water for support, and to offload joints by unweighting joint load by at least 50 % in navel deep water and by at least 75-80% in chest to neck deep water.  Viscosity of the water is needed for resistance of strengthening. Water current perturbations provides challenge to standing balance requiring increased core activation.  PATIENT EDUCATION:  Education details: Discussed eval findings, rehab rationale, aquatic program progression/POC and pools in area.  Patient is in agreement  Person educated: Patient Education method: Explanation Education comprehension: verbalized understanding  HOME EXERCISE PROGRAM: [id number]  ASSESSMENT:  CLINICAL IMPRESSION: The patient tolerated treatment well. We worked on Investment banker, corporate for the hip. We reviewed how she can do the step down at home. We also reviewed activity she can perform at the gym, She continues to have tightness in her TFL. It improved with manual therapy. We will continue to progress patient.      Initial impression Patient is a 38 y.o. f who was seen today for physical therapy evaluation and treatment for Left hip pain. Pt reports old injury x 10 years ago which resolved after cortizone injection.  It has given her issue off and on over the past years but exacerbated in the last 4 months. She is an active exerciser and has 2 small children.  Reports she has continued with her exercise regimen backing off some when left hip/knee and ankle begin to hurt. She has stopped running altogether. Xray is neg for lumbar spine dysfunction.  She has positive Trendelenburg sign, demonstrates left glut weakness, does not appear to have SI involvement at todays assessment. Left iliopsoas/psoas muscle tightness present with palpation as well as some tenderness. 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  She will benefit from skilled physical therapy to improve left glut/hip complex strength  to normalize Human resources officer stability.  Plan to spend some time in aquatics but majority of session land based.  OBJECTIVE IMPAIRMENTS: decreased activity tolerance, decreased strength, increased muscle spasms, and pain.   ACTIVITY LIMITATIONS: lifting, squatting, and stairs  PARTICIPATION LIMITATIONS:  normal exercise regimen  PERSONAL FACTORS: Fitness and Time since onset of injury/illness/exacerbation are  also affecting patient's functional outcome.   REHAB POTENTIAL: Good  CLINICAL DECISION MAKING: Evolving/moderate complexity  EVALUATION COMPLEXITY: Moderate   GOALS: Goals reviewed with patient? Yes  SHORT TERM GOALS: Target date: 10/12/23 Pt will report decreased pain with exercise submerged to <2/10 and in positions land based which cause pain Baseline:squats/hip flex, descending steps; 6/10 Goal status: NOT MET 10/23/23  2.  Pt will demonstrate indep with aquatic exercises and will make decision on best setting for exercise going forward.  Aquatic HEP to be assigned as approp. Baseline:  Goal status: met 10/23/23   LONG TERM GOALS: Target date: 11/20/23  Pt to meet stated Foto Goal of 82% Baseline: 69% Goal status: INITIAL  2.  Pt will tolerate running short distance (under 1 mile) Baseline:  Goal status: INITIAL  3.  Pt will descend 1 flight of steps without pain Baseline: pain Goal status: INITIAL  4.  Pt will improve strength in Left hip = to contralateral side to demonstrate improved biomechanical stability of hip Baseline:  Goal status: INITIAL  5.  Pt will return to indep regimen of exercises without limitation due to weakness or pain Baseline: limited Goal status: INITIAL  6.  Pt will amb without trendelenburg left hip Baseline: positive tendelenburg Goal status: INITIAL   PLAN:  PT FREQUENCY: 2x/week  PT DURATION: 8 weeks  PLANNED INTERVENTIONS: 97164- PT Re-evaluation, 97110-Therapeutic exercises, 97530- Therapeutic activity, 97112- Neuromuscular re-education, 97535- Self Care, 16109- Manual therapy, [id number]- Gait training, [phone]- Orthotic Fit/training, [phone]- Aquatic Therapy, 97014- Electrical stimulation (unattended), [phone]- Ionotophoresis 4mg /ml Dexamethasone, Patient/Family education, Balance training, Stair training, Taping, Dry Needling, Joint mobilization, DME instructions, Cryotherapy, and Moist heat  PLAN FOR NEXT SESSION: hip strengthening  special attention to glutes, knee strengthening, gait training, land based consider DN; stretching program special attention to hip flex, pain control.   Lorayne Bender PT DPT 11/02/23 8:36 AM [facility] [address] [place], [place], [address] Phone: [phone]   Fax:  [phone]

## 2023-11-03 ENCOUNTER — Encounter (HOSPITAL_BASED_OUTPATIENT_CLINIC_OR_DEPARTMENT_OTHER): Payer: Self-pay | Admitting: Physical Therapy

## 2023-11-04 ENCOUNTER — Ambulatory Visit (HOSPITAL_BASED_OUTPATIENT_CLINIC_OR_DEPARTMENT_OTHER): Payer: Commercial Managed Care - PPO | Admitting: Physical Therapy

## 2023-11-04 ENCOUNTER — Encounter (HOSPITAL_BASED_OUTPATIENT_CLINIC_OR_DEPARTMENT_OTHER): Payer: Self-pay | Admitting: Physical Therapy

## 2023-11-04 DIAGNOSIS — M25552 Pain in left hip: Secondary | ICD-10-CM | POA: Diagnosis not present

## 2023-11-04 DIAGNOSIS — R2689 Other abnormalities of gait and mobility: Secondary | ICD-10-CM

## 2023-11-04 DIAGNOSIS — R29898 Other symptoms and signs involving the musculoskeletal system: Secondary | ICD-10-CM | POA: Diagnosis not present

## 2023-11-04 NOTE — Therapy (Signed)
 OUTPATIENT PHYSICAL THERAPY LOWER EXTREMITY TREATMENT   Patient Name: Jasmine Conrad MRN: [id number] DOB:[birth year], 38 y.o., female Today's Date: 11/04/2023  END OF SESSION:  PT End of Session - 11/04/23 0806     Visit Number 9    Number of Visits 16    Date for PT Re-Evaluation 11/20/23    Authorization Type [employer]    PT Start Time 0800    PT Stop Time 0843    PT Time Calculation (min) 43 min    Activity Tolerance Patient tolerated treatment well    Behavior During Therapy [facility] for tasks assessed/performed                Past Medical History:  Diagnosis Date   Depression    Fibroid    Medical history non-contributory    Past Surgical History:  Procedure Laterality Date   CESAREAN SECTION N/A 09/10/2017   Procedure: CESAREAN SECTION;  Surgeon: Mitchel Honour, DO;  [facility]: [facility];  Service: Obstetrics;  Laterality: N/A;  Primary edc 09/16/17 NKDA [name] RNFA   CESAREAN SECTION N/A 08/29/2019   Procedure: CESAREAN SECTION;  Surgeon: Mitchel Honour, DO;  Location: [facility];  Service: Obstetrics;  Laterality: N/A;  REPEAT EDC 09/08/19 NKDA [name],  RNFA   WISDOM TOOTH EXTRACTION     Patient Active Problem List   Diagnosis Date Noted   Previous cesarean section 08/29/2019   S/P cesarean section 09/10/2017   Trauma during pregnancy 07/03/2017   Left shoulder pain 04/01/2016    PCP: Maryelizabeth Rowan MD  REFERRING PROVIDER: Andi Devon, DO   REFERRING DIAG: [phone] (ICD-10-CM) - Left hip pain   THERAPY DIAG:  Left hip pain  Weakness of left hip  Other abnormalities of gait and mobility  Rationale for Evaluation and Treatment: Rehabilitation  ONSET DATE: exacerbated last 4-5 months  SUBJECTIVE:   SUBJECTIVE STATEMENT: Patient reports he had less soreness down the front of her leg after the last visit but she had more soreness deep in her hip and her gluteal.  Initial subjective 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 Have been  doing ok until last few months having trouble completing anything strenuous throughout left le. Pain started in my hip and now it is my left knee and ankle. Had a cortizone shot 10 years ago in hip due to an injury which made it better at the time. Has had 2 pregnancies since with c-sections.  Stopped running. Walking down step knee and ankle pop.  Seem to be getting better for a few months then just started getting worse as I ramped up weights  PERTINENT HISTORY: Evaluate and treat for left hip pain and glute weakness. Please include aquatic therapy and land based exercises in treatment plan. PAIN:  Are you having pain? Yes: NPRS scale: current 2.5/10 Pain location: left glute and anterior hip Pain description: dull ache Aggravating factors: exercises dead lifts; squats  feels unstable Relieving factors: advil  PRECAUTIONS: None  RED FLAGS: None   WEIGHT BEARING RESTRICTIONS: No  FALLS:  Has patient fallen in last 6 months? No  LIVING ENVIRONMENT: Lives with: lives with their family Lives in: House/apartment Stairs: Yes: Internal: 16 steps; on right going up Has following equipment at home: None  OCCUPATION: seated job  PLOF: Independent  PATIENT GOALS: no pain return to run, don't want to hurt myself further  NEXT MD VISIT: 6-8 weeks/prn  OBJECTIVE:  Note: Objective measures were completed at Evaluation unless otherwise noted.  DIAGNOSTIC  FINDINGS: DG Lumbar IMPRESSION: Negative.  PATIENT SURVEYS:  FOTO Primary score 69% with goal of 82%  COGNITION: Overall cognitive status: Within functional limits for tasks assessed     SENSATION: WFL   MUSCLE LENGTH: Hamstrings: wfl  POSTURE: No Significant postural limitations  PALPATION: TTP left iliopsoas/psoas; left Glut insertion   LOWER EXTREMITY ROM:  WFL  LOWER EXTREMITY MMT:  MMT Right eval Left eval  Hip flexion 49.1 45.2  Hip extension 5/5 4/5  Hip abduction 22.6 23.9  Hip adduction    Hip  internal rotation  4 no P!  Hip external rotation 5 4 P!  Knee flexion    Knee extension 50.9 53.4 P! Hip flex  Ankle dorsiflexion    Ankle plantarflexion    Ankle inversion    Ankle eversion     (Blank rows = not tested)  LOWER EXTREMITY SPECIAL TESTS:  Hip special tests: Luisa Hart (FABER) test: positive , Trendelenburg test: positive , Thomas test: negative, and SI distraction test: negative  FUNCTIONAL TESTS:  5 times sit to stand: 7.28   GAIT: Unlimited No visual trendelenburg with amb but tests positive with actual test.                                                                                                                                TREATMENT  3/5  Manual Therapy: IASTM to L ant/lateral thigh (mid to proximal) to decrease fascial restrictions.  Skilled palpation of trigger points in the anterior hip and gluteal.  Neuromuscular re-ed: Ball press and hold 3 x 12 with TA breathing Double knee-to-chest in pain-free range 3 x 12 with TA breathing Bridge with green band and thighs, 3 sec pause, x 10, 2 sets Trigger Point Dry Needling  Initial Treatment: Pt instructed on Dry Needling rational, procedures, and possible side effects. Pt instructed to expect mild to moderate muscle soreness later in the day and/or into the next day.  Pt instructed in methods to reduce muscle soreness. Pt instructed to continue prescribed HEP. Because Dry Needling was performed over or adjacent to a lung field, pt was educated on S/S of pneumothorax and to seek immediate medical attention should they occur.  Patient was educated on signs and symptoms of infection and other risk factors and advised to seek medical attention should they occur.  Patient verbalized understanding of these instructions and education.   Patient Verbal Consent Given: Yes Education Handout Provided: Yes Muscles Treated: TFL 2 times using 0.30 X50 needle glute medius 2 times using 0.30 X50 needle Electrical  Stimulation Performed: No Treatment Response/Outcome: Great twitch response with each needle     3/3 Therapeutic Exercise: Upright bike, L2, x 6 min Lt Thomas stretch 2 x 30s (3rd rep holding R knee to chest) L Knee extension machine 3x10 15 lbs  Repeat of Thomas stretch x 30 sec (after IASTM)  Manual Therapy: IASTM to L ant/lateral thigh (mid to proximal) to decrease  fascial restrictions        PATIENT EDUCATION:  Education details: Discussed eval findings, rehab rationale, aquatic program progression/POC and pools in area. Patient is in agreement  Person educated: Patient Education method: Explanation Education comprehension: verbalized understanding  HOME EXERCISE PROGRAM: [id number]  ASSESSMENT:  CLINICAL IMPRESSION: Therapy trialed trigger point dry needling.  She continues to have significant trigger points in her anterior and posterior hip.  We reviewed light exercises following the treatment to help reduce post needle soreness.  She was advised to continue with basic exercises over the next couple days.  Will we will reassess exercise program next visit.   Initial impression Patient is a 38 y.o. f who was seen today for physical therapy evaluation and treatment for Left hip pain. Pt reports old injury x 10 years ago which resolved after cortizone injection.  It has given her issue off and on over the past years but exacerbated in the last 4 months. She is an active exerciser and has 2 small children.  Reports she has continued with her exercise regimen backing off some when left hip/knee and ankle begin to hurt. She has stopped running altogether. Xray is neg for lumbar spine dysfunction.  She has positive Trendelenburg sign, demonstrates left glut weakness, does not appear to have SI involvement at todays assessment. Left iliopsoas/psoas muscle tightness present with palpation as well as some tenderness. 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  She will benefit from skilled physical therapy to improve left glut/hip complex strength to normalize bio mechanic stability.  Plan to spend some time in aquatics but majority of session land based.  OBJECTIVE IMPAIRMENTS: decreased activity tolerance, decreased strength, increased muscle spasms, and pain.   ACTIVITY LIMITATIONS: lifting, squatting, and stairs  PARTICIPATION LIMITATIONS:  normal exercise regimen  PERSONAL FACTORS: Fitness and Time since onset of injury/illness/exacerbation are also affecting patient's functional outcome.   REHAB POTENTIAL: Good  CLINICAL DECISION MAKING: Evolving/moderate complexity  EVALUATION COMPLEXITY: Moderate   GOALS: Goals reviewed with patient? Yes  SHORT TERM GOALS: Target date: 10/12/23 Pt will report decreased pain with exercise submerged to <2/10 and in positions land based which cause pain Baseline:squats/hip flex, descending steps; 6/10 Goal status: NOT MET 10/23/23  2.  Pt will demonstrate indep with aquatic exercises and will make decision on best setting for exercise going forward.  Aquatic HEP to be assigned as approp. Baseline:  Goal status: met 10/23/23   LONG TERM GOALS: Target date: 11/20/23  Pt to meet stated Foto Goal of 82% Baseline: 69% Goal status: INITIAL  2.  Pt will tolerate running short distance (under 1 mile) Baseline:  Goal status: INITIAL  3.  Pt will descend 1 flight of steps without pain Baseline: pain Goal status: INITIAL  4.  Pt will improve strength in Left hip = to contralateral side to demonstrate improved biomechanical stability of hip Baseline:  Goal status: INITIAL  5.  Pt will return to indep regimen of exercises without limitation due to weakness or pain Baseline: limited Goal status: INITIAL  6.  Pt will amb without trendelenburg left hip Baseline: positive tendelenburg Goal status:  INITIAL   PLAN:  PT FREQUENCY: 2x/week  PT DURATION: 8 weeks  PLANNED INTERVENTIONS: 97164- PT Re-evaluation, 97110-Therapeutic exercises, 97530- Therapeutic activity, [id number]- Neuromuscular re-education, 97535- Self Care, [address]- Manual therapy, [id number]- Gait training, [phone]- Orthotic Fit/training, [phone]- Aquatic  Therapy, 97014- Electrical stimulation (unattended), [phone]- Ionotophoresis 4mg /ml Dexamethasone, Patient/Family education, Balance training, Stair training, Taping, Dry Needling, Joint mobilization, DME instructions, Cryotherapy, and Moist heat  PLAN FOR NEXT SESSION: hip strengthening special attention to glutes, knee strengthening, gait training, land based consider DN; stretching program special attention to hip flex, pain control.   Lorayne Bender PT DPT 11/04/23 2:39 PM [facility] [address] [place], [place], [address] Phone: [phone]   Fax:  [phone]

## 2023-11-09 ENCOUNTER — Ambulatory Visit (HOSPITAL_BASED_OUTPATIENT_CLINIC_OR_DEPARTMENT_OTHER): Payer: Commercial Managed Care - PPO | Admitting: Physical Therapy

## 2023-11-09 ENCOUNTER — Encounter (HOSPITAL_BASED_OUTPATIENT_CLINIC_OR_DEPARTMENT_OTHER): Payer: Self-pay | Admitting: Physical Therapy

## 2023-11-09 DIAGNOSIS — R2689 Other abnormalities of gait and mobility: Secondary | ICD-10-CM

## 2023-11-09 DIAGNOSIS — R29898 Other symptoms and signs involving the musculoskeletal system: Secondary | ICD-10-CM | POA: Diagnosis not present

## 2023-11-09 DIAGNOSIS — M25552 Pain in left hip: Secondary | ICD-10-CM

## 2023-11-09 NOTE — Therapy (Signed)
 OUTPATIENT PHYSICAL THERAPY LOWER EXTREMITY TREATMENT   Patient Name: Jasmine Conrad MRN: [id number] DOB:Mar 03, 1986, 38 y.o., female Today's Date: 11/09/2023  END OF SESSION:  PT End of Session - 11/09/23 0934     Visit Number 10    Number of Visits 16    Date for PT Re-Evaluation 11/20/23    Authorization Type [employer]et[employer]    PT Start Time 0930    PT Stop Time 1012    PT Time Calculation (min) 42 min    Activity Tolerance Patient tolerated treatment well    Behavior During Therapy WFL for tasks assessed/performed                Past Medical History:  Diagnosis Date   Depression    Fibroid    Medical history non-contributory    Past Surgical History:  Procedure Laterality Date   CESAREAN SECTION N/A 09/10/2017   Procedure: CESAREAN SECTION;  Surgeon: Mitchel Honour, DO;  Location: [facility];  Service: Obstetrics;  Laterality: N/A;  Primary edc 09/16/17 NKDA [name] RNFA   CESAREAN SECTION N/A 08/29/2019   Procedure: CESAREAN SECTION;  Surgeon: Mitchel Honour, DO;  Location: [facility];  Service: Obstetrics;  Laterality: N/A;  REPEAT EDC 09/08/19 NKDA [name],  RNFA   WISDOM TOOTH EXTRACTION     Patient Active Problem List   Diagnosis Date Noted   Previous cesarean section 08/29/2019   S/P cesarean section 09/10/2017   Trauma during pregnancy 07/03/2017   Left shoulder pain 04/01/2016    PCP: Maryelizabeth Rowan MD  REFERRING PROVIDER: Andi Devon, DO   REFERRING DIAG: [phone] (ICD-10-CM) - Left hip pain   THERAPY DIAG:  Left hip pain  Weakness of left hip  Other abnormalities of gait and mobility  Rationale for Evaluation and Treatment: Rehabilitation  ONSET DATE: exacerbated last 4-5 months  SUBJECTIVE:   SUBJECTIVE STATEMENT: The patient reports the pain in her anterior hip is better but now the pain is more tow back and gluteal    Initial subjective 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 Have been doing ok until last few months  having trouble completing anything strenuous throughout left le. Pain started in my hip and now it is my left knee and ankle. Had a cortizone shot 10 years ago in hip due to an injury which made it better at the time. Has had 2 pregnancies since with c-sections.  Stopped running. Walking down step knee and ankle pop.  Seem to be getting better for a few months then just started getting worse as I ramped up weights  PERTINENT HISTORY: Evaluate and treat for left hip pain and glute weakness. Please include aquatic therapy and land based exercises in treatment plan. PAIN:  Are you having pain? Yes: NPRS scale: current 2.5/10 Pain location: left glute and anterior hip Pain description: dull ache Aggravating factors: exercises dead lifts; squats  feels unstable Relieving factors: advil  PRECAUTIONS: None  RED FLAGS: None   WEIGHT BEARING RESTRICTIONS: No  FALLS:  Has patient fallen in last 6 months? No  LIVING ENVIRONMENT: Lives with: lives with their family Lives in: House/apartment Stairs: Yes: Internal: 16 steps; on right going up Has following equipment at home: None  OCCUPATION: seated job  PLOF: Independent  PATIENT GOALS: no pain return to run, don't want to hurt myself further  NEXT MD VISIT: 6-8 weeks/prn  OBJECTIVE:  Note: Objective measures were completed at Evaluation unless otherwise noted.  DIAGNOSTIC FINDINGS: DG Lumbar IMPRESSION: Negative.  PATIENT SURVEYS:  FOTO Primary score 69% with goal of 82%  COGNITION: Overall cognitive status: Within functional limits for tasks assessed     SENSATION: WFL   MUSCLE LENGTH: Hamstrings: wfl  POSTURE: No Significant postural limitations  PALPATION: TTP left iliopsoas/psoas; left Glut insertion   LOWER EXTREMITY ROM:  WFL  LOWER EXTREMITY MMT:  MMT Right eval Left eval  Hip flexion 49.1 45.2  Hip extension 5/5 4/5  Hip abduction 22.6 23.9  Hip adduction    Hip internal rotation  4 no P!  Hip  external rotation 5 4 P!  Knee flexion    Knee extension 50.9 53.4 P! Hip flex  Ankle dorsiflexion    Ankle plantarflexion    Ankle inversion    Ankle eversion     (Blank rows = not tested)  LOWER EXTREMITY SPECIAL TESTS:  Hip special tests: Luisa Hart (FABER) test: positive , Trendelenburg test: positive , Thomas test: negative, and SI distraction test: negative  FUNCTIONAL TESTS:  5 times sit to stand: 7.28   GAIT: Unlimited No visual trendelenburg with amb but tests positive with actual test.                                                                                                                                TREATMENT  3/5 Trigger Point Dry Needling  Initial Treatment: Pt instructed on Dry Needling rational, procedures, and possible side effects. Pt instructed to expect mild to moderate muscle soreness later in the day and/or into the next day.  Pt instructed in methods to reduce muscle soreness. Pt instructed to continue prescribed HEP. Because Dry Needling was performed over or adjacent to a lung field, pt was educated on S/S of pneumothorax and to seek immediate medical attention should they occur.  Patient was educated on signs and symptoms of infection and other risk factors and advised to seek medical attention should they occur.  Patient verbalized understanding of these instructions and education.   Patient Verbal Consent Given: Yes Education Handout Provided: Yes Muscles Treated: TFL 2 times using 0.30 X50 needle glute medius 2 times using 0.30 X50 needle Electrical Stimulation Performed: No Treatment Response/Outcome: Great twitch response with each needle   Manual: skilled palpation of trigger points. Trigger point release to lumbar paraspinals and left gluteal  Nuero re-ed   March with abdominal brace 2x10  Lateral band walk x10 down and back  Bridge with band green 2x12 green. Could feel it in the back  Monster walk 2x10        Last visit    Manual Therapy: IASTM to L ant/lateral thigh (mid to proximal) to decrease fascial restrictions.  Skilled palpation of trigger points in the anterior hip and gluteal.  Neuromuscular re-ed: Ball press and hold 3 x 12 with TA breathing Double knee-to-chest in pain-free range 3 x 12 with TA breathing Bridge with green band and thighs, 3 sec pause, x 10, 2 sets  Trigger Point Dry Needling  Initial Treatment: Pt instructed on Dry Needling rational, procedures, and possible side effects. Pt instructed to expect mild to moderate muscle soreness later in the day and/or into the next day.  Pt instructed in methods to reduce muscle soreness. Pt instructed to continue prescribed HEP. Because Dry Needling was performed over or adjacent to a lung field, pt was educated on S/S of pneumothorax and to seek immediate medical attention should they occur.  Patient was educated on signs and symptoms of infection and other risk factors and advised to seek medical attention should they occur.  Patient verbalized understanding of these instructions and education.   Patient Verbal Consent Given: Yes Education Handout Provided: Yes Muscles Treated: TFL 2 times using 0.30 X50 needle glute medius 2 times using 0.30 X50 needle Electrical Stimulation Performed: No Treatment Response/Outcome: Great twitch response with each needle     3/3 Therapeutic Exercise: Upright bike, L2, x 6 min Lt Thomas stretch 2 x 30s (3rd rep holding R knee to chest) L Knee extension machine 3x10 15 lbs  Repeat of Thomas stretch x 30 sec (after IASTM)  Manual Therapy: IASTM to L ant/lateral thigh (mid to proximal) to decrease fascial restrictions        PATIENT EDUCATION:  Education details: Discussed eval findings, rehab rationale, aquatic program progression/POC and pools in area. Patient is in agreement  Person educated: Patient Education method: Explanation Education comprehension: verbalized understanding  HOME  EXERCISE PROGRAM: [id number]  ASSESSMENT:  CLINICAL IMPRESSION: The patient had less anterior tightness today but more in her back. We performed needling lumbar paraspinlas, gluteal, and TFL. She had a great twitch. We reviewed her HEP. She could feel it but it was tolerable. Therapy will continue to progress as tolerated.   Initial impression Patient is a 38 y.o. f who was seen today for physical therapy evaluation and treatment for Left hip pain. Pt reports old injury x 10 years ago which resolved after cortizone injection.  It has given her issue off and on over the past years but exacerbated in the last 4 months. She is an active exerciser and has 2 small children.  Reports she has continued with her exercise regimen backing off some when left hip/knee and ankle begin to hurt. She has stopped running altogether. Xray is neg for lumbar spine dysfunction.  She has positive Trendelenburg sign, demonstrates left glut weakness, does not appear to have SI involvement at todays assessment. Left iliopsoas/psoas muscle tightness present with palpation as well as some tenderness. 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  She will benefit from skilled physical therapy to improve left glut/hip complex strength to normalize bio mechanic stability.  Plan to spend some time in aquatics but majority of session land based.  OBJECTIVE IMPAIRMENTS: decreased activity tolerance, decreased strength, increased muscle spasms, and pain.   ACTIVITY LIMITATIONS: lifting, squatting, and stairs  PARTICIPATION LIMITATIONS:  normal exercise regimen  PERSONAL FACTORS: Fitness and Time since onset of injury/illness/exacerbation are also affecting patient's functional outcome.   REHAB POTENTIAL: Good  CLINICAL DECISION MAKING: Evolving/moderate complexity  EVALUATION COMPLEXITY: Moderate   GOALS: Goals  reviewed with patient? Yes  SHORT TERM GOALS: Target date: 10/12/23 Pt will report decreased pain with exercise submerged to <2/10 and in positions land based which cause pain Baseline:squats/hip flex, descending steps; 6/10 Goal status: NOT MET 10/23/23  2.  Pt will demonstrate indep with aquatic exercises and will make decision on best setting for exercise going forward.  Aquatic HEP to be assigned as approp. Baseline:  Goal status: met 10/23/23   LONG TERM GOALS: Target date: 11/20/23  Pt to meet stated Foto Goal of 82% Baseline: 69% Goal status: INITIAL  2.  Pt will tolerate running short distance (under 1 mile) Baseline:  Goal status: INITIAL  3.  Pt will descend 1 flight of steps without pain Baseline: pain Goal status: INITIAL  4.  Pt will improve strength in Left hip = to contralateral side to demonstrate improved biomechanical stability of hip Baseline:  Goal status: INITIAL  5.  Pt will return to indep regimen of exercises without limitation due to weakness or pain Baseline: limited Goal status: INITIAL  6.  Pt will amb without trendelenburg left hip Baseline: positive tendelenburg Goal status: INITIAL   PLAN:  PT FREQUENCY: 2x/week  PT DURATION: 8 weeks  PLANNED INTERVENTIONS: 97164- PT Re-evaluation, 97110-Therapeutic exercises, 97530- Therapeutic activity, 97112- Neuromuscular re-education, 97535- Self Care, 88416- Manual therapy, [phone]- Gait training, [phone]- Orthotic Fit/training, [phone]- Aquatic Therapy, 97014- Electrical stimulation (unattended), [phone]- Ionotophoresis 4mg /ml Dexamethasone, Patient/Family education, Balance training, Stair training, Taping, Dry Needling, Joint mobilization, DME instructions, Cryotherapy, and Moist heat  PLAN FOR NEXT SESSION: hip strengthening special attention to glutes, knee strengthening, gait training, land based consider DN; stretching program special attention to hip flex, pain control.   Lorayne Bender PT  DPT 11/09/23 9:36 AM [facility] [address] [place], [place], [address] Phone: [phone]   Fax:  [phone]

## 2023-11-11 ENCOUNTER — Encounter (HOSPITAL_BASED_OUTPATIENT_CLINIC_OR_DEPARTMENT_OTHER): Payer: Commercial Managed Care - PPO | Admitting: Physical Therapy

## 2023-11-12 ENCOUNTER — Ambulatory Visit: Admitting: Family Medicine

## 2023-11-12 ENCOUNTER — Encounter: Payer: Self-pay | Admitting: Family Medicine

## 2023-11-12 VITALS — BP 104/62 | Ht 62.0 in | Wt 123.0 lb

## 2023-11-12 DIAGNOSIS — M25552 Pain in left hip: Secondary | ICD-10-CM | POA: Diagnosis not present

## 2023-11-12 NOTE — Patient Instructions (Signed)
 [facility] [address], [place], [place] [address] Phone: [phone]

## 2023-11-12 NOTE — Progress Notes (Signed)
 DATE OF VISIT: 11/12/2023        Jasmine Conrad DOB: 03-13-1986 MRN: [id number]  CC:  f/u LT hip pain  History of present Illness: Jasmine Conrad is a 38 y.o. 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  They have now been needling both areas which has been somewhat helpful She is needed to cut back her exercise activity.  Previously exercising 4 times a week, now doing 2 times a week and primarily upper body Denies any numbness or tingling Last imaging was in 2016  Medications:  Outpatient Encounter Medications as of 11/12/2023  Medication Sig   metroNIDAZOLE (METROGEL) 0.75 % gel Apply 1 Application topically daily.   No facility-administered encounter medications on file as of 11/12/2023.    Allergies: is allergic to sulfamethoxazole-trimethoprim.  Physical Examination: Vitals: BP 104/62   Ht 5\' 2"  (1.575 m)   Wt 123 lb (55.8 kg)   BMI 22.50 kg/m  GENERAL:  Jasmine Conrad is a 38 y.o. female appearing their stated age, alert and oriented x 3, in no apparent distress.  SKIN: no rashes or lesions, skin clean, dry, intact MSK: Hips: Left hip with full range of motion with some pain at terminal flexion.  No pain with internal or external rotation.  Negative FABER, FADIR.  Mild tenderness palpation over the greater trochanter piriformis, as well as glutes.  Delayed gluteal activation with Trendelenburg test, but improved strength.  Hip strength is overall improved 5  -/5 throughout Right hip with full range of motion without pain.  Negative FABER and FADIR.  Negative Trendelenburg test.  Hip strength 5/5 throughout Good hamstring flexibility bilaterally.  Negative straight leg raise bilaterally. 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  Definitely has improved overall strength, but still abnormal exam concerning for hip pathology such as intra-articular issue or gluteal injury  Plan: -Will obtain MRI left hip to rule out labral or other soft tissue injury.  She will follow-up with me 1 week after the MRI to discuss results and further treatment -She will continue with PT in the interim -Encouraged to reach out with any questions or concerns  Patient expressed understanding & agreement with above.  Encounter Diagnosis  Name Primary?   Left hip pain Yes    Orders Placed This Encounter  Procedures   MR HIP LEFT WO CONTRAST

## 2023-11-13 ENCOUNTER — Ambulatory Visit (HOSPITAL_BASED_OUTPATIENT_CLINIC_OR_DEPARTMENT_OTHER)
Admission: RE | Admit: 2023-11-13 | Discharge: 2023-11-13 | Disposition: A | Discharge status: Home / Self Care | Source: Ambulatory Visit | Attending: Family Medicine | Admitting: Family Medicine

## 2023-11-13 DIAGNOSIS — M25552 Pain in left hip: Secondary | ICD-10-CM | POA: Diagnosis not present

## 2023-11-13 DIAGNOSIS — G8929 Other chronic pain: Secondary | ICD-10-CM | POA: Diagnosis not present

## 2023-11-16 ENCOUNTER — Ambulatory Visit (HOSPITAL_BASED_OUTPATIENT_CLINIC_OR_DEPARTMENT_OTHER): Payer: Commercial Managed Care - PPO | Admitting: Physical Therapy

## 2023-11-16 ENCOUNTER — Encounter (HOSPITAL_BASED_OUTPATIENT_CLINIC_OR_DEPARTMENT_OTHER): Payer: Self-pay | Admitting: Physical Therapy

## 2023-11-16 DIAGNOSIS — R29898 Other symptoms and signs involving the musculoskeletal system: Secondary | ICD-10-CM | POA: Diagnosis not present

## 2023-11-16 DIAGNOSIS — R2689 Other abnormalities of gait and mobility: Secondary | ICD-10-CM

## 2023-11-16 DIAGNOSIS — M25552 Pain in left hip: Secondary | ICD-10-CM | POA: Diagnosis not present

## 2023-11-16 NOTE — Therapy (Signed)
 OUTPATIENT PHYSICAL THERAPY LOWER EXTREMITY TREATMENT   Patient Name: VI BIDDINGER MRN: [id number] DOB:1985/10/13, 38 y.o., female Today's Date: 11/16/2023  END OF SESSION:  PT End of Session - 11/16/23 0829     Visit Number 11    Number of Visits 16    Date for PT Re-Evaluation 11/20/23    Authorization Type [employer]    PT Start Time 0801    PT Stop Time 0843    PT Time Calculation (min) 42 min    Activity Tolerance Patient tolerated treatment well    Behavior During Therapy [facility] for tasks assessed/performed                 Past Medical History:  Diagnosis Date   Depression    Fibroid    Medical history non-contributory    Past Surgical History:  Procedure Laterality Date   CESAREAN SECTION N/A 09/10/2017   Procedure: CESAREAN SECTION;  Surgeon: Mitchel Honour, DO;  Location: [facility];  Service: Obstetrics;  Laterality: N/A;  Primary edc 09/16/17 NKDA [name] RNFA   CESAREAN SECTION N/A 08/29/2019   Procedure: CESAREAN SECTION;  Surgeon: Mitchel Honour, DO;  Location: [facility];  Service: Obstetrics;  Laterality: N/A;  REPEAT EDC 09/08/19 NKDA [name],  RNFA   WISDOM TOOTH EXTRACTION     Patient Active Problem List   Diagnosis Date Noted   Previous cesarean section 08/29/2019   S/P cesarean section 09/10/2017   Trauma during pregnancy 07/03/2017   Left shoulder pain 04/01/2016    PCP: Maryelizabeth Rowan MD  REFERRING PROVIDER: Andi Devon, DO   REFERRING DIAG: [phone] (ICD-10-CM) - Left hip pain   THERAPY DIAG:  Left hip pain  Weakness of left hip  Other abnormalities of gait and mobility  Rationale for Evaluation and Treatment: Rehabilitation  ONSET DATE: exacerbated last 4-5 months  SUBJECTIVE:   SUBJECTIVE STATEMENT: Pt states that she had MRI has not had results come back in. Sore into glutes from exercise.    Initial subjective 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 Have been doing ok until last few months having trouble  completing anything strenuous throughout left le. Pain started in my hip and now it is my left knee and ankle. Had a cortizone shot 10 years ago in hip due to an injury which made it better at the time. Has had 2 pregnancies since with c-sections.  Stopped running. Walking down step knee and ankle pop.  Seem to be getting better for a few months then just started getting worse as I ramped up weights  PERTINENT HISTORY: Evaluate and treat for left hip pain and glute weakness. Please include aquatic therapy and land based exercises in treatment plan. PAIN:  Are you having pain? Yes: NPRS scale: current 2/10 Pain location: left glute and anterior hip Pain description: dull ache Aggravating factors: exercises dead lifts; squats  feels unstable Relieving factors: advil  PRECAUTIONS: None  RED FLAGS: None   WEIGHT BEARING RESTRICTIONS: No  FALLS:  Has patient fallen in last 6 months? No  LIVING ENVIRONMENT: Lives with: lives with their family Lives in: House/apartment Stairs: Yes: Internal: 16 steps; on right going up Has following equipment at home: None  OCCUPATION: seated job  PLOF: Independent  PATIENT GOALS: no pain return to run, don't want to hurt myself further  NEXT MD VISIT: 6-8 weeks/prn  OBJECTIVE:  Note: Objective measures were completed at Evaluation unless otherwise noted.  DIAGNOSTIC FINDINGS: DG Lumbar IMPRESSION: Negative.  PATIENT  SURVEYS:  FOTO Primary score 69% with goal of 82%  COGNITION: Overall cognitive status: Within functional limits for tasks assessed     SENSATION: WFL   MUSCLE LENGTH: Hamstrings: wfl  POSTURE: No Significant postural limitations  PALPATION: TTP left iliopsoas/psoas; left Glut insertion   LOWER EXTREMITY ROM:  WFL  LOWER EXTREMITY MMT:  MMT Right eval Left eval  Hip flexion 49.1 45.2  Hip extension 5/5 4/5  Hip abduction 22.6 23.9  Hip adduction    Hip internal rotation  4 no P!  Hip external rotation  5 4 P!  Knee flexion    Knee extension 50.9 53.4 P! Hip flex  Ankle dorsiflexion    Ankle plantarflexion    Ankle inversion    Ankle eversion     (Blank rows = not tested)  LOWER EXTREMITY SPECIAL TESTS:  Hip special tests: Luisa Hart (FABER) test: positive , Trendelenburg test: positive , Thomas test: negative, and SI distraction test: negative  FUNCTIONAL TESTS:  5 times sit to stand: 7.28   GAIT: Unlimited No visual trendelenburg with amb but tests positive with actual test.                                                                                                                                TREATMENT   3/17  Trigger Point Dry Needling  Subsequent Treatment: Instructions provided previously at initial dry needling treatment.   Patient Verbal Consent Given: Yes Education Handout Provided: Previously Provided Muscles Treated: L rec fem Electrical Stimulation Performed: No Treatment Response/Outcome: large LTR elicited  STM rec fem and glute med on L  Couch stretch 30s 3x Standing fire hydrant RTB at ankles 2x10  7lb RDL 3x8 at wall (hip hinge focus)  Box squat to max tolerable depth 3x8    3/5 Trigger Point Dry Needling  Initial Treatment: Pt instructed on Dry Needling rational, procedures, and possible side effects. Pt instructed to expect mild to moderate muscle soreness later in the day and/or into the next day.  Pt instructed in methods to reduce muscle soreness. Pt instructed to continue prescribed HEP. Because Dry Needling was performed over or adjacent to a lung field, pt was educated on S/S of pneumothorax and to seek immediate medical attention should they occur.  Patient was educated on signs and symptoms of infection and other risk factors and advised to seek medical attention should they occur.  Patient verbalized understanding of these instructions and education.   Patient Verbal Consent Given: Yes Education Handout Provided: Yes Muscles  Treated: TFL 2 times using 0.30 X50 needle glute medius 2 times using 0.30 X50 needle Electrical Stimulation Performed: No Treatment Response/Outcome: Great twitch response with each needle   Manual: skilled palpation of trigger points. Trigger point release to lumbar paraspinals and left gluteal  Nuero re-ed   March with abdominal brace 2x10  Lateral band walk x10 down and back  Bridge with band green 2x12 green.  Could feel it in the back  Monster walk 2x10   Last visit   Manual Therapy: IASTM to L ant/lateral thigh (mid to proximal) to decrease fascial restrictions.  Skilled palpation of trigger points in the anterior hip and gluteal.  Neuromuscular re-ed: Ball press and hold 3 x 12 with TA breathing Double knee-to-chest in pain-free range 3 x 12 with TA breathing Bridge with green band and thighs, 3 sec pause, x 10, 2 sets Trigger Point Dry Needling  Initial Treatment: Pt instructed on Dry Needling rational, procedures, and possible side effects. Pt instructed to expect mild to moderate muscle soreness later in the day and/or into the next day.  Pt instructed in methods to reduce muscle soreness. Pt instructed to continue prescribed HEP. Because Dry Needling was performed over or adjacent to a lung field, pt was educated on S/S of pneumothorax and to seek immediate medical attention should they occur.  Patient was educated on signs and symptoms of infection and other risk factors and advised to seek medical attention should they occur.  Patient verbalized understanding of these instructions and education.   Patient Verbal Consent Given: Yes Education Handout Provided: Yes Muscles Treated: TFL 2 times using 0.30 X50 needle glute medius 2 times using 0.30 X50 needle Electrical Stimulation Performed: No Treatment Response/Outcome: Great twitch response with each needle     3/3 Therapeutic Exercise: Upright bike, L2, x 6 min Lt Thomas stretch 2 x 30s (3rd rep holding R knee  to chest) L Knee extension machine 3x10 15 lbs  Repeat of Thomas stretch x 30 sec (after IASTM)  Manual Therapy: IASTM to L ant/lateral thigh (mid to proximal) to decrease fascial restrictions        PATIENT EDUCATION:  Education details: Discussed eval findings, rehab rationale, aquatic program progression/POC and pools in area. Patient is in agreement  Person educated: Patient Education method: Explanation Education comprehension: verbalized understanding  HOME EXERCISE PROGRAM: [id number]  ASSESSMENT:  CLINICAL IMPRESSION:  Clinical testing does suggest potential for left hip internal derangement at this time.  Patient rectus femoris today extremely reactive to trigger point dry needling but does improve post manual therapy with increase in soft tissue extensibility which may help to relieve anterior hip tightness.  Patient home exercise program today updated to improve single limb stability and return to hip pending type motions with technique corrections provided in order to allow her to exercise and reduce hip stiffness into hip flexion.  Plan to assess for response to hip treatment today's session at next and consider use of Mulligan mobilizations and lumbar joint mobilizations to aid in anterior hip pain.  Patient to return to MD later this week for discussion of MRI results.   Initial impression Patient is a 38 y.o. f who was seen today for physical therapy evaluation and treatment for Left hip pain. Pt reports old injury x 10 years ago which resolved after cortizone injection.  It has given her issue off and on over the past years but exacerbated in the last 4 months. She is an active exerciser and has 2 small children.  Reports she has continued with her exercise regimen backing off some when left hip/knee and ankle begin to hurt. She has stopped running altogether. Xray is neg for lumbar spine dysfunction.  She has positive Trendelenburg sign, demonstrates left glut weakness,  does not appear to have SI involvement at todays assessment. Left iliopsoas/psoas muscle tightness present with palpation as well as some tenderness. 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  She will benefit from skilled physical therapy to improve left glut/hip complex strength to normalize bio mechanic stability.  Plan to spend some time in aquatics but majority of session land based.  OBJECTIVE IMPAIRMENTS: decreased activity tolerance, decreased strength, increased muscle spasms, and pain.   ACTIVITY LIMITATIONS: lifting, squatting, and stairs  PARTICIPATION LIMITATIONS:  normal exercise regimen  PERSONAL FACTORS: Fitness and Time since onset of injury/illness/exacerbation are also affecting patient's functional outcome.   REHAB POTENTIAL: Good  CLINICAL DECISION MAKING: Evolving/moderate complexity  EVALUATION COMPLEXITY: Moderate   GOALS: Goals reviewed with patient? Yes  SHORT TERM GOALS: Target date: 10/12/23 Pt will report decreased pain with exercise submerged to <2/10 and in positions land based which cause pain Baseline:squats/hip flex, descending steps; 6/10 Goal status: NOT MET 10/23/23  2.  Pt will demonstrate indep with aquatic exercises and will make decision on best setting for exercise going forward.  Aquatic HEP to be assigned as approp. Baseline:  Goal status: met 10/23/23   LONG TERM GOALS: Target date: 11/20/23  Pt to meet stated Foto Goal of 82% Baseline: 69% Goal status: INITIAL  2.  Pt will tolerate running short distance (under 1 mile) Baseline:  Goal status: INITIAL  3.  Pt will descend 1 flight of steps without pain Baseline: pain Goal status: INITIAL  4.  Pt will improve strength in Left hip = to contralateral side to demonstrate improved biomechanical stability of hip Baseline:  Goal status: INITIAL  5.  Pt will return to indep  regimen of exercises without limitation due to weakness or pain Baseline: limited Goal status: INITIAL  6.  Pt will amb without trendelenburg left hip Baseline: positive tendelenburg Goal status: INITIAL   PLAN:  PT FREQUENCY: 2x/week  PT DURATION: 8 weeks  PLANNED INTERVENTIONS: 97164- PT Re-evaluation, 97110-Therapeutic exercises, 97530- Therapeutic activity, 97112- Neuromuscular re-education, 97535- Self Care, 16109- Manual therapy, [phone]- Gait training, [phone]- Orthotic Fit/training, [phone]- Aquatic Therapy, 97014- Electrical stimulation (unattended), [phone]- Ionotophoresis 4mg /ml Dexamethasone, Patient/Family education, Balance training, Stair training, Taping, Dry Needling, Joint mobilization, DME instructions, Cryotherapy, and Moist heat  PLAN FOR NEXT SESSION: hip strengthening special attention to glutes, knee strengthening, gait training, land based consider DN; stretching program special attention to hip flex, pain control.   Zebedee Iba PT, DPT 11/16/23 9:34 AM

## 2023-11-18 ENCOUNTER — Encounter (HOSPITAL_BASED_OUTPATIENT_CLINIC_OR_DEPARTMENT_OTHER): Payer: Self-pay | Admitting: Physical Therapy

## 2023-11-18 ENCOUNTER — Ambulatory Visit (HOSPITAL_BASED_OUTPATIENT_CLINIC_OR_DEPARTMENT_OTHER): Payer: Commercial Managed Care - PPO | Admitting: Physical Therapy

## 2023-11-18 DIAGNOSIS — M25552 Pain in left hip: Secondary | ICD-10-CM | POA: Diagnosis not present

## 2023-11-18 DIAGNOSIS — R2689 Other abnormalities of gait and mobility: Secondary | ICD-10-CM | POA: Diagnosis not present

## 2023-11-18 DIAGNOSIS — R29898 Other symptoms and signs involving the musculoskeletal system: Secondary | ICD-10-CM

## 2023-11-18 NOTE — Therapy (Signed)
 OUTPATIENT PHYSICAL THERAPY LOWER EXTREMITY TREATMENT   Patient Name: Jasmine Conrad MRN: [id number] DOB:Apr 17, 1986, 38 y.o., female Today's Date: 11/18/2023  END OF SESSION:  PT End of Session - 11/18/23 0845     Visit Number 12    Number of Visits 16    Date for PT Re-Evaluation 11/20/23    Authorization Type [employer]    PT Start Time 0801    PT Stop Time 0841    PT Time Calculation (min) 40 min    Activity Tolerance Patient tolerated treatment well    Behavior During Therapy [facility] for tasks assessed/performed                  Past Medical History:  Diagnosis Date   Depression    Fibroid    Medical history non-contributory    Past Surgical History:  Procedure Laterality Date   CESAREAN SECTION N/A 09/10/2017   Procedure: CESAREAN SECTION;  Surgeon: Mitchel Honour, DO;  Location: [facility];  Service: Obstetrics;  Laterality: N/A;  Primary edc 09/16/17 NKDA [name] RNFA   CESAREAN SECTION N/A 08/29/2019   Procedure: CESAREAN SECTION;  Surgeon: Mitchel Honour, DO;  Location: [facility];  Service: Obstetrics;  Laterality: N/A;  REPEAT EDC 09/08/19 NKDA [name],  RNFA   WISDOM TOOTH EXTRACTION     Patient Active Problem List   Diagnosis Date Noted   Previous cesarean section 08/29/2019   S/P cesarean section 09/10/2017   Trauma during pregnancy 07/03/2017   Left shoulder pain 04/01/2016    PCP: Maryelizabeth Rowan MD  REFERRING PROVIDER: Andi Devon, DO   REFERRING DIAG: [phone] (ICD-10-CM) - Left hip pain   THERAPY DIAG:  Left hip pain  Weakness of left hip  Other abnormalities of gait and mobility  Rationale for Evaluation and Treatment: Rehabilitation  ONSET DATE: exacerbated last 4-5 months  SUBJECTIVE:   SUBJECTIVE STATEMENT: Pt states she was very sore after last evening. The HS is very sore.    Initial subjective 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 Have been doing ok until last few months having trouble completing anything  strenuous throughout left le. Pain started in my hip and now it is my left knee and ankle. Had a cortizone shot 10 years ago in hip due to an injury which made it better at the time. Has had 2 pregnancies since with c-sections.  Stopped running. Walking down step knee and ankle pop.  Seem to be getting better for a few months then just started getting worse as I ramped up weights  PERTINENT HISTORY: Evaluate and treat for left hip pain and glute weakness. Please include aquatic therapy and land based exercises in treatment plan. PAIN:  Are you having pain? Yes: NPRS scale: current 3/10 Pain location: left glute and anterior hip Pain description: dull ache Aggravating factors: exercises dead lifts; squats  feels unstable Relieving factors: advil  PRECAUTIONS: None  RED FLAGS: None   WEIGHT BEARING RESTRICTIONS: No  FALLS:  Has patient fallen in last 6 months? No  LIVING ENVIRONMENT: Lives with: lives with their family Lives in: House/apartment Stairs: Yes: Internal: 16 steps; on right going up Has following equipment at home: None  OCCUPATION: seated job  PLOF: Independent  PATIENT GOALS: no pain return to run, don't want to hurt myself further  NEXT MD VISIT: 6-8 weeks/prn  OBJECTIVE:  Note: Objective measures were completed at Evaluation unless otherwise noted.  DIAGNOSTIC FINDINGS: DG Lumbar IMPRESSION: Negative.  PATIENT SURVEYS:  FOTO  Primary score 69% with goal of 82%  COGNITION: Overall cognitive status: Within functional limits for tasks assessed     SENSATION: WFL   MUSCLE LENGTH: Hamstrings: wfl  POSTURE: No Significant postural limitations  PALPATION: TTP left iliopsoas/psoas; left Glut insertion   LOWER EXTREMITY ROM:  WFL  LOWER EXTREMITY MMT:  MMT Right eval Left eval  Hip flexion 49.1 45.2  Hip extension 5/5 4/5  Hip abduction 22.6 23.9  Hip adduction    Hip internal rotation  4 no P!  Hip external rotation 5 4 P!  Knee  flexion    Knee extension 50.9 53.4 P! Hip flex  Ankle dorsiflexion    Ankle plantarflexion    Ankle inversion    Ankle eversion     (Blank rows = not tested)  LOWER EXTREMITY SPECIAL TESTS:  Hip special tests: Luisa Hart (FABER) test: positive , Trendelenburg test: positive , Thomas test: negative, and SI distraction test: negative  FUNCTIONAL TESTS:  5 times sit to stand: 7.28   GAIT: Unlimited No visual trendelenburg with amb but tests positive with actual test.                                                                                                                                TREATMENT   3/19    STM rec fem and glute med on L Mulligan hip mob inf and lateral grade III; LAD  L1-5 CPA and L  UPA grade III Supine HS stretch 15s 3x Wall sit 90 deg 15s 4x Leg swing on box 8" 20x   3/17  Trigger Point Dry Needling  Subsequent Treatment: Instructions provided previously at initial dry needling treatment.   Patient Verbal Consent Given: Yes Education Handout Provided: Previously Provided Muscles Treated: L rec fem Electrical Stimulation Performed: No Treatment Response/Outcome: large LTR elicited  STM rec fem and glute med on L  Couch stretch 30s 3x Standing fire hydrant RTB at ankles 2x10  7lb RDL 3x8 at wall (hip hinge focus)  Box squat to max tolerable depth 3x8    3/5 Trigger Point Dry Needling  Initial Treatment: Pt instructed on Dry Needling rational, procedures, and possible side effects. Pt instructed to expect mild to moderate muscle soreness later in the day and/or into the next day.  Pt instructed in methods to reduce muscle soreness. Pt instructed to continue prescribed HEP. Because Dry Needling was performed over or adjacent to a lung field, pt was educated on S/S of pneumothorax and to seek immediate medical attention should they occur.  Patient was educated on signs and symptoms of infection and other risk factors and advised to seek  medical attention should they occur.  Patient verbalized understanding of these instructions and education.   Patient Verbal Consent Given: Yes Education Handout Provided: Yes Muscles Treated: TFL 2 times using 0.30 X50 needle glute medius 2 times using 0.30 X50 needle Electrical Stimulation Performed: No Treatment Response/Outcome: Great twitch  response with each needle   Manual: skilled palpation of trigger points. Trigger point release to lumbar paraspinals and left gluteal  Nuero re-ed   March with abdominal brace 2x10  Lateral band walk x10 down and back  Bridge with band green 2x12 green. Could feel it in the back  Monster walk 2x10   Last visit   Manual Therapy: IASTM to L ant/lateral thigh (mid to proximal) to decrease fascial restrictions.  Skilled palpation of trigger points in the anterior hip and gluteal.  Neuromuscular re-ed: Ball press and hold 3 x 12 with TA breathing Double knee-to-chest in pain-free range 3 x 12 with TA breathing Bridge with green band and thighs, 3 sec pause, x 10, 2 sets Trigger Point Dry Needling  Initial Treatment: Pt instructed on Dry Needling rational, procedures, and possible side effects. Pt instructed to expect mild to moderate muscle soreness later in the day and/or into the next day.  Pt instructed in methods to reduce muscle soreness. Pt instructed to continue prescribed HEP. Because Dry Needling was performed over or adjacent to a lung field, pt was educated on S/S of pneumothorax and to seek immediate medical attention should they occur.  Patient was educated on signs and symptoms of infection and other risk factors and advised to seek medical attention should they occur.  Patient verbalized understanding of these instructions and education.   Patient Verbal Consent Given: Yes Education Handout Provided: Yes Muscles Treated: TFL 2 times using 0.30 X50 needle glute medius 2 times using 0.30 X50 needle Electrical Stimulation  Performed: No Treatment Response/Outcome: Great twitch response with each needle     3/3 Therapeutic Exercise: Upright bike, L2, x 6 min Lt Thomas stretch 2 x 30s (3rd rep holding R knee to chest) L Knee extension machine 3x10 15 lbs  Repeat of Thomas stretch x 30 sec (after IASTM)  Manual Therapy: IASTM to L ant/lateral thigh (mid to proximal) to decrease fascial restrictions        PATIENT EDUCATION:  Education details: Discussed eval findings, rehab rationale, aquatic program progression/POC and pools in area. Patient is in agreement  Person educated: Patient Education method: Explanation Education comprehension: verbalized understanding  HOME EXERCISE PROGRAM: [id number]  ASSESSMENT:  CLINICAL IMPRESSION:  Pt report of improvement in baseline discomfort following manual therapy. Pt does have continued pain with squatting and knee ext type motion so HEP modified to isometric in wall sitting position to avoid provocative movement. Pt had signficant soft tissue restriction about the L quad and glute today that is likely from addition of new exercise. Edu provided that modified constraints with exercise are okay to continue despite increase in soreness. Painful/provocative motions to be avoided and modified at this time. Plan to continue working on L hip mobility and pain management at future sessions as pt is still limited with ADL, exercise, and caregiver duties.    Initial impression Patient is a 38 y.o. f who was seen today for physical therapy evaluation and treatment for Left hip pain. Pt reports old injury x 10 years ago which resolved after cortizone injection.  It has given her issue off and on over the past years but exacerbated in the last 4 months. She is an active exerciser and has 2 small children.  Reports she has continued with her exercise regimen backing off some when left hip/knee and ankle begin to hurt. She has stopped running altogether. Xray is neg for lumbar  spine dysfunction.  She has positive Trendelenburg sign, demonstrates left  glut weakness, does not appear to have SI involvement at todays assessment. Left iliopsoas/psoas muscle tightness present with palpation as well as some tenderness. 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  She will benefit from skilled physical therapy to improve left glut/hip complex strength to normalize bio mechanic stability.  Plan to spend some time in aquatics but majority of session land based.  OBJECTIVE IMPAIRMENTS: decreased activity tolerance, decreased strength, increased muscle spasms, and pain.   ACTIVITY LIMITATIONS: lifting, squatting, and stairs  PARTICIPATION LIMITATIONS:  normal exercise regimen  PERSONAL FACTORS: Fitness and Time since onset of injury/illness/exacerbation are also affecting patient's functional outcome.   REHAB POTENTIAL: Good  CLINICAL DECISION MAKING: Evolving/moderate complexity  EVALUATION COMPLEXITY: Moderate   GOALS: Goals reviewed with patient? Yes  SHORT TERM GOALS: Target date: 10/12/23 Pt will report decreased pain with exercise submerged to <2/10 and in positions land based which cause pain Baseline:squats/hip flex, descending steps; 6/10 Goal status: NOT MET 10/23/23  2.  Pt will demonstrate indep with aquatic exercises and will make decision on best setting for exercise going forward.  Aquatic HEP to be assigned as approp. Baseline:  Goal status: met 10/23/23   LONG TERM GOALS: Target date: 11/20/23  Pt to meet stated Foto Goal of 82% Baseline: 69% Goal status: INITIAL  2.  Pt will tolerate running short distance (under 1 mile) Baseline:  Goal status: INITIAL  3.  Pt will descend 1 flight of steps without pain Baseline: pain Goal status: INITIAL  4.  Pt will improve strength in Left hip = to contralateral side to demonstrate improved  biomechanical stability of hip Baseline:  Goal status: INITIAL  5.  Pt will return to indep regimen of exercises without limitation due to weakness or pain Baseline: limited Goal status: INITIAL  6.  Pt will amb without trendelenburg left hip Baseline: positive tendelenburg Goal status: INITIAL   PLAN:  PT FREQUENCY: 2x/week  PT DURATION: 8 weeks  PLANNED INTERVENTIONS: 97164- PT Re-evaluation, 97110-Therapeutic exercises, 97530- Therapeutic activity, 97112- Neuromuscular re-education, 97535- Self Care, 14782- Manual therapy, [id number]- Gait training, [phone]- Orthotic Fit/training, [phone]- Aquatic Therapy, 97014- Electrical stimulation (unattended), [phone]- Ionotophoresis 4mg /ml Dexamethasone, Patient/Family education, Balance training, Stair training, Taping, Dry Needling, Joint mobilization, DME instructions, Cryotherapy, and Moist heat  PLAN FOR NEXT SESSION: hip strengthening special attention to glutes, knee strengthening, gait training, land based consider DN; stretching program special attention to hip flex, pain control.   Zebedee Iba PT, DPT 11/18/23 8:48 AM

## 2023-11-23 ENCOUNTER — Ambulatory Visit: Admitting: Family Medicine

## 2023-11-23 ENCOUNTER — Other Ambulatory Visit (HOSPITAL_COMMUNITY): Payer: Self-pay

## 2023-11-23 ENCOUNTER — Encounter: Payer: Self-pay | Admitting: Family Medicine

## 2023-11-23 ENCOUNTER — Ambulatory Visit: Payer: Commercial Managed Care - PPO | Admitting: Family Medicine

## 2023-11-23 VITALS — BP 100/64 | Ht 62.0 in | Wt 123.0 lb

## 2023-11-23 DIAGNOSIS — M5416 Radiculopathy, lumbar region: Secondary | ICD-10-CM | POA: Diagnosis not present

## 2023-11-23 DIAGNOSIS — M25552 Pain in left hip: Secondary | ICD-10-CM | POA: Diagnosis not present

## 2023-11-23 MED ORDER — LORAZEPAM 0.5 MG PO TABS
ORAL_TABLET | ORAL | 0 refills | Status: AC
  Filled 2023-11-23: qty 2, 1d supply, fill #0

## 2023-11-23 NOTE — Addendum Note (Signed)
 Addended by: Andi Devon on: 11/23/2023 02:46 PM   Modules accepted: Orders, Level of Service

## 2023-11-23 NOTE — Progress Notes (Addendum)
 DATE OF VISIT: 11/23/2023        ALENI ANDRUS DOB: 11-04-1985 MRN: [id number]  CC:  f/u MRI results  History of present Illness: SHARNELLE CAPPELLI is a 38 y.o. female who presents for a follow-up visit for MRI results and f/u hip pain Last seen by me 11/12/23 Completed MRI Lt hip 11/13/23 that was normal Symptoms are unchanged She continues to be regular physical therapy, along with dry needling.  Has 2 appointments scheduled this week. No new symptoms   Medications:  Outpatient Encounter Medications as of 11/23/2023  Medication Sig   metroNIDAZOLE (METROGEL) 0.75 % gel Apply 1 Application topically daily.   No facility-administered encounter medications on file as of 11/23/2023.    Allergies: is allergic to sulfamethoxazole-trimethoprim.  Physical Examination: Vitals: BP 100/64   Ht 5\' 2"  (1.575 m)   Wt 123 lb (55.8 kg)   BMI 22.50 kg/m  GENERAL:  MACKENSI MAHADEO is a 38 y.o. female appearing their stated age, alert and oriented x 3, in no apparent distress.  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  Will reach out with results once available.  If MRI is normal we will consider ordering labs for autoimmune/rheumatologic evaluation.  We did discuss this today. -Will continue with physical therapy as she is doing -Encouraged to reach out with any questions or concerns Left lumbar  radiculopathy Possible left-sided lumbar radiculopathy, has ongoing chronic left hip pain with biomechanical instability and has been doing regular PT  Plan: -MRI results reviewed with patient in detail -Ordered MRI lumbar spine to rule out herniated disc, pinched nerve.  Will reach out with results once available.  If MRI is normal we will consider ordering labs for autoimmune/rheumatologic evaluation.  We did discuss this today. -Will continue with physical therapy as she is doing -Encouraged to reach out with any questions or concerns  Patient expressed understanding & agreement with above.  After the visit patient reached out requesting medication prior to MRI.  Rx Ativan 0.5 mg 1 tab p.o. 30 to 60 minutes prior to MRI, can repeat dose x 1 at time of scan if needed.  Dispense 2 tabs no refills.  [facility] PMP reviewed, no concerns.  Encounter Diagnoses  Name Primary?   Left hip pain Yes   Left lumbar radiculopathy     Orders Placed This Encounter  Procedures   MR Lumbar Spine Wo Contrast

## 2023-11-23 NOTE — Patient Instructions (Signed)
 [facility] [address], [place], [place] [address] Phone: [phone]

## 2023-11-25 ENCOUNTER — Ambulatory Visit (HOSPITAL_BASED_OUTPATIENT_CLINIC_OR_DEPARTMENT_OTHER): Admitting: Physical Therapy

## 2023-11-25 ENCOUNTER — Encounter (HOSPITAL_BASED_OUTPATIENT_CLINIC_OR_DEPARTMENT_OTHER): Payer: Self-pay | Admitting: Physical Therapy

## 2023-11-25 DIAGNOSIS — R2689 Other abnormalities of gait and mobility: Secondary | ICD-10-CM

## 2023-11-25 DIAGNOSIS — M25552 Pain in left hip: Secondary | ICD-10-CM | POA: Diagnosis not present

## 2023-11-25 DIAGNOSIS — R29898 Other symptoms and signs involving the musculoskeletal system: Secondary | ICD-10-CM | POA: Diagnosis not present

## 2023-11-25 NOTE — Therapy (Signed)
 OUTPATIENT PHYSICAL THERAPY LOWER EXTREMITY TREATMENT   Patient Name: Jasmine Conrad MRN: [id number] DOB:February 08, 1986, 38 y.o., female Today's Date: 11/25/2023  END OF SESSION:  PT End of Session - 11/25/23 0804     Visit Number 13    Date for PT Re-Evaluation 11/20/23    Authorization Type [employer]    PT Start Time 0802    PT Stop Time 0844    PT Time Calculation (min) 42 min    Behavior During Therapy [facility] for tasks assessed/performed             Past Medical History:  Diagnosis Date   Depression    Fibroid    Medical history non-contributory    Past Surgical History:  Procedure Laterality Date   CESAREAN SECTION N/A 09/10/2017   Procedure: CESAREAN SECTION;  Surgeon: Mitchel Honour, DO;  Location: [facility];  Service: Obstetrics;  Laterality: N/A;  Primary edc 09/16/17 NKDA [name] RNFA   CESAREAN SECTION N/A 08/29/2019   Procedure: CESAREAN SECTION;  Surgeon: Mitchel Honour, DO;  Location: [facility];  Service: Obstetrics;  Laterality: N/A;  REPEAT EDC 09/08/19 NKDA [name],  RNFA   WISDOM TOOTH EXTRACTION     Patient Active Problem List   Diagnosis Date Noted   Previous cesarean section 08/29/2019   S/P cesarean section 09/10/2017   Trauma during pregnancy 07/03/2017   Left shoulder pain 04/01/2016    PCP: Maryelizabeth Rowan MD  REFERRING PROVIDER: Andi Devon, DO   REFERRING DIAG: [phone] (ICD-10-CM) - Left hip pain   THERAPY DIAG:  Left hip pain  Weakness of left hip  Other abnormalities of gait and mobility  Rationale for Evaluation and Treatment: Rehabilitation  ONSET DATE: exacerbated last 4-5 months  SUBJECTIVE:   SUBJECTIVE STATEMENT: Pt states she feels like she is getting more stable with her LLE.  Pt reports no improvement in the pain since starting therapy.   Some of the HEP exercises cause irritation to her low back; she modifies them as needed. Pt reports she received MRI of hip and it didn't show any issues; has lumbar MRI  scheduled for Friday.    Initial subjective 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 Have been doing ok until last few months having trouble completing anything strenuous throughout left le. Pain started in my hip and now it is my left knee and ankle. Had a cortizone shot 10 years ago in hip due to an injury which made it better at the time. Has had 2 pregnancies since with c-sections.  Stopped running. Walking down step knee and ankle pop.  Seem to be getting better for a few months then just started getting worse as I ramped up weights  PERTINENT HISTORY: Evaluate and treat for left hip pain and glute weakness. Please include aquatic therapy and land based exercises in treatment plan. PAIN:  Are you having pain? Yes: NPRS scale: current 3/10 Pain location: left glute and anterior hip Pain description: dull ache Aggravating factors: exercises dead lifts; squats  feels unstable Relieving factors: advil  PRECAUTIONS: None  RED FLAGS: None   WEIGHT BEARING RESTRICTIONS: No  FALLS:  Has patient fallen in last 6 months? No  LIVING ENVIRONMENT: Lives with: lives with their family Lives in: House/apartment Stairs: Yes: Internal: 16 steps; on right going up Has following equipment at home: None  OCCUPATION: seated job  PLOF: Independent  PATIENT GOALS: no pain return to run, don't want to hurt myself further  NEXT MD VISIT: 6-8  weeks/prn  OBJECTIVE:  Note: Objective measures were completed at Evaluation unless otherwise noted.  DIAGNOSTIC FINDINGS: DG Lumbar IMPRESSION: Negative.  PATIENT SURVEYS:  FOTO Primary score 69% with goal of 82% 11/25/23: 59%  COGNITION: Overall cognitive status: Within functional limits for tasks assessed     SENSATION: WFL   MUSCLE LENGTH: Hamstrings: wfl  POSTURE: No Significant postural limitations  PALPATION: TTP left iliopsoas/psoas; left Glut insertion   LOWER EXTREMITY ROM:  WFL  LOWER EXTREMITY MMT:  MMT Right eval  Left eval Right  Left  Hip flexion 49.1 45.2 49.9 40.1  Hip extension 5/5 4/5    Hip abduction 22.6 23.9 39.9 35.3  Hip adduction      Hip internal rotation  4 no P!    Hip external rotation 5 4 P!    Knee flexion      Knee extension 50.9 53.4 P! Hip flex    Ankle dorsiflexion      Ankle plantarflexion      Ankle inversion      Ankle eversion       (Blank rows = not tested)  LOWER EXTREMITY SPECIAL TESTS:  Hip special tests: Luisa Hart (FABER) test: positive , Trendelenburg test: positive , Thomas test: negative, and SI distraction test: negative  FUNCTIONAL TESTS:  5 times sit to stand: 7.28   GAIT: Unlimited No visual trendelenburg with amb but tests positive with actual test.                                                                                                                                TREATMENT  [facility] Adult PT Treatment:                                                DATE: 11/25/23  Therapeutic Exercise: Long sitting -> on elbows- SLR into hip abdct/ addct x 10 LLE, x 5 RLE L adductor stretch L modified Thomas stretch with R knee towards chest x 30s x 2  Manual Therapy: IASTM to Lt ant/lateral thigh to decrease fascial restrictions TPR to Lt adductors MFR to L iliacus to decrease fascial restrictions and improve mobility  Self Care: Pt educated on self MFR to L iliopsoas with ball to hip in prone   3/19 STM rec fem and glute med on L Mulligan hip mob inf and lateral grade III; LAD  L1-5 CPA and L  UPA grade III Supine HS stretch 15s 3x Wall sit 90 deg 15s 4x Leg swing on box 8" 20x   3/17  Trigger Point Dry Needling  Subsequent Treatment: Instructions provided previously at initial dry needling treatment.   Patient Verbal Consent Given: Yes Education Handout Provided: Previously Provided Muscles Treated: L rec fem Electrical Stimulation Performed: No Treatment Response/Outcome: large LTR elicited  STM rec fem and  glute med on  L  Couch stretch 30s 3x Standing fire hydrant RTB at ankles 2x10  7lb RDL 3x8 at wall (hip hinge focus)  Box squat to max tolerable depth 3x8    3/5 Trigger Point Dry Needling  Initial Treatment: Pt instructed on Dry Needling rational, procedures, and possible side effects. Pt instructed to expect mild to moderate muscle soreness later in the day and/or into the next day.  Pt instructed in methods to reduce muscle soreness. Pt instructed to continue prescribed HEP. Because Dry Needling was performed over or adjacent to a lung field, pt was educated on S/S of pneumothorax and to seek immediate medical attention should they occur.  Patient was educated on signs and symptoms of infection and other risk factors and advised to seek medical attention should they occur.  Patient verbalized understanding of these instructions and education.   Patient Verbal Consent Given: Yes Education Handout Provided: Yes Muscles Treated: TFL 2 times using 0.30 X50 needle glute medius 2 times using 0.30 X50 needle Electrical Stimulation Performed: No Treatment Response/Outcome: Great twitch response with each needle   Manual: skilled palpation of trigger points. Trigger point release to lumbar paraspinals and left gluteal  Nuero re-ed   March with abdominal brace 2x10  Lateral band walk x10 down and back  Bridge with band green 2x12 green. Could feel it in the back  Monster walk 2x10   Last visit   Manual Therapy: IASTM to L ant/lateral thigh (mid to proximal) to decrease fascial restrictions.  Skilled palpation of trigger points in the anterior hip and gluteal.  Neuromuscular re-ed: Ball press and hold 3 x 12 with TA breathing Double knee-to-chest in pain-free range 3 x 12 with TA breathing Bridge with green band and thighs, 3 sec pause, x 10, 2 sets Trigger Point Dry Needling  Initial Treatment: Pt instructed on Dry Needling rational, procedures, and possible side effects. Pt instructed to  expect mild to moderate muscle soreness later in the day and/or into the next day.  Pt instructed in methods to reduce muscle soreness. Pt instructed to continue prescribed HEP. Because Dry Needling was performed over or adjacent to a lung field, pt was educated on S/S of pneumothorax and to seek immediate medical attention should they occur.  Patient was educated on signs and symptoms of infection and other risk factors and advised to seek medical attention should they occur.  Patient verbalized understanding of these instructions and education.   Patient Verbal Consent Given: Yes Education Handout Provided: Yes Muscles Treated: TFL 2 times using 0.30 X50 needle glute medius 2 times using 0.30 X50 needle Electrical Stimulation Performed: No Treatment Response/Outcome: Great twitch response with each needle     3/3 Therapeutic Exercise: Upright bike, L2, x 6 min Lt Thomas stretch 2 x 30s (3rd rep holding R knee to chest) L Knee extension machine 3x10 15 lbs  Repeat of Thomas stretch x 30 sec (after IASTM)  Manual Therapy: IASTM to L ant/lateral thigh (mid to proximal) to decrease fascial restrictions        PATIENT EDUCATION:  Education details:exercise rationale, progressions/ modifications; self care (massage)  Person educated: Patient Education method: Explanation Education comprehension: verbalized understanding  HOME EXERCISE PROGRAM: [id number]  ASSESSMENT:  CLINICAL IMPRESSION:  Pt demonstrated improved hip abdct strength on L; no strength gains with hip flexion strength.  FOTO score less than at intake, likely due to pt's increase awareness of deficits.  Palpable tightness in Lt psoas; slightly improved with MFR/TPR.  Pt shown self MFR with ball.  Pt reported reduction of tightness in L hip at end of session. Pt has partially met her goals. Painful/provocative motions to be avoided and modified at this time. Plan to continue working on L hip mobility and pain management  at future sessions as pt is still limited with ADL, exercise, and caregiver duties.    Initial impression Patient is a 38 y.o. f who was seen today for physical therapy evaluation and treatment for Left hip pain. Pt reports old injury x 10 years ago which resolved after cortizone injection.  It has given her issue off and on over the past years but exacerbated in the last 4 months. She is an active exerciser and has 2 small children.  Reports she has continued with her exercise regimen backing off some when left hip/knee and ankle begin to hurt. She has stopped running altogether. Xray is neg for lumbar spine dysfunction.  She has positive Trendelenburg sign, demonstrates left glut weakness, does not appear to have SI involvement at todays assessment. Left iliopsoas/psoas muscle tightness present with palpation as well as some tenderness. 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  She will benefit from skilled physical therapy to improve left glut/hip complex strength to normalize bio mechanic stability.  Plan to spend some time in aquatics but majority of session land based.  OBJECTIVE IMPAIRMENTS: decreased activity tolerance, decreased strength, increased muscle spasms, and pain.   ACTIVITY LIMITATIONS: lifting, squatting, and stairs  PARTICIPATION LIMITATIONS:  normal exercise regimen  PERSONAL FACTORS: Fitness and Time since onset of injury/illness/exacerbation are also affecting patient's functional outcome.   REHAB POTENTIAL: Good  CLINICAL DECISION MAKING: Evolving/moderate complexity  EVALUATION COMPLEXITY: Moderate   GOALS: Goals reviewed with patient? Yes  SHORT TERM GOALS: Target date: 10/12/23 Pt will report decreased pain with exercise submerged to <2/10 and in positions land based which cause pain Baseline:squats/hip flex, descending steps; 6/10 Goal status: NOT  MET 10/23/23  2.  Pt will demonstrate indep with aquatic exercises and will make decision on best setting for exercise going forward.  Aquatic HEP to be assigned as approp. Baseline:  Goal status: met 10/23/23   LONG TERM GOALS: Target date: 11/20/23  Pt to meet stated Foto Goal of 82% Baseline: 69%; see above  Goal status: In progress  2.  Pt will tolerate running short distance (under 1 mile) Baseline:  Goal status: Not met - 11/25/23  3.  Pt will descend 1 flight of steps without pain Baseline: painful, but NO increase in pain Goal status: Partially met - 11/25/23  4.  Pt will improve strength in Left hip = to contralateral side to demonstrate improved biomechanical stability of hip Baseline: see above Goal status: In progress - 11/25/23  5.  Pt will return to indep regimen of exercises without limitation due to weakness or pain Baseline: limited;  modifying gym routine Goal status: In progress - 11/25/23  6.  Pt will amb without trendelenburg left hip Baseline: positive tendelenburg Goal status: In progress -11/25/23   PLAN:  PT FREQUENCY: 2x/week  PT DURATION: 8 weeks  PLANNED INTERVENTIONS: 97164- PT Re-evaluation, 97110-Therapeutic exercises, 97530- Therapeutic activity, 97112- Neuromuscular re-education, 97535- Self Care, [address]- Manual therapy, [phone]- Gait training, [phone]- Orthotic Fit/training, [phone]- Aquatic Therapy, [phone]- Electrical stimulation (unattended), [phone]- Ionotophoresis 4mg /ml Dexamethasone, Patient/Family education, Balance training, Stair training, Taping, Dry Needling, Joint  mobilization, DME instructions, Cryotherapy, and Moist heat  PLAN FOR NEXT SESSION: hip strengthening special attention to glutes, knee strengthening, gait training, land based consider DN; stretching program special attention to hip flex, pain control.  Mayer Camel, PTA 11/25/23 12:12 PM [facility] [address] [place], [place], [address] Phone: [phone]   Fax:  [phone]

## 2023-11-27 ENCOUNTER — Encounter (HOSPITAL_BASED_OUTPATIENT_CLINIC_OR_DEPARTMENT_OTHER): Payer: Self-pay | Admitting: Physical Therapy

## 2023-11-27 ENCOUNTER — Ambulatory Visit (HOSPITAL_BASED_OUTPATIENT_CLINIC_OR_DEPARTMENT_OTHER): Payer: Self-pay | Admitting: Physical Therapy

## 2023-11-27 ENCOUNTER — Ambulatory Visit (HOSPITAL_BASED_OUTPATIENT_CLINIC_OR_DEPARTMENT_OTHER)
Admission: RE | Admit: 2023-11-27 | Discharge: 2023-11-27 | Disposition: A | Discharge status: Home / Self Care | Source: Ambulatory Visit | Attending: Family Medicine | Admitting: Family Medicine

## 2023-11-27 DIAGNOSIS — M5416 Radiculopathy, lumbar region: Secondary | ICD-10-CM | POA: Diagnosis not present

## 2023-11-27 DIAGNOSIS — D1809 Hemangioma of other sites: Secondary | ICD-10-CM | POA: Diagnosis not present

## 2023-11-27 DIAGNOSIS — M25552 Pain in left hip: Secondary | ICD-10-CM | POA: Diagnosis not present

## 2023-11-27 DIAGNOSIS — R2689 Other abnormalities of gait and mobility: Secondary | ICD-10-CM

## 2023-11-27 DIAGNOSIS — M545 Low back pain, unspecified: Secondary | ICD-10-CM | POA: Diagnosis not present

## 2023-11-27 DIAGNOSIS — R29898 Other symptoms and signs involving the musculoskeletal system: Secondary | ICD-10-CM

## 2023-11-27 NOTE — Therapy (Signed)
 OUTPATIENT PHYSICAL THERAPY LOWER EXTREMITY TREATMENT   Patient Name: Jasmine Conrad MRN: [id number] DOB:February 12, 1986, 38 y.o., female Today's Date: 11/27/2023  END OF SESSION:  PT End of Session - 11/27/23 2112     Visit Number 14    Number of Visits 28    Date for PT Re-Evaluation 01/22/24    PT Start Time 0800    PT Stop Time 0843    PT Time Calculation (min) 43 min    Activity Tolerance Patient tolerated treatment well    Behavior During Therapy [facility] for tasks assessed/performed              Past Medical History:  Diagnosis Date   Depression    Fibroid    Medical history non-contributory    Past Surgical History:  Procedure Laterality Date   CESAREAN SECTION N/A 09/10/2017   Procedure: CESAREAN SECTION;  Surgeon: Mitchel Honour, DO;  Location: [facility];  Service: Obstetrics;  Laterality: N/A;  Primary edc 09/16/17 NKDA [name] RNFA   CESAREAN SECTION N/A 08/29/2019   Procedure: CESAREAN SECTION;  Surgeon: Mitchel Honour, DO;  Location: [facility];  Service: Obstetrics;  Laterality: N/A;  REPEAT EDC 09/08/19 NKDA [name],  RNFA   WISDOM TOOTH EXTRACTION     Patient Active Problem List   Diagnosis Date Noted   Previous cesarean section 08/29/2019   S/P cesarean section 09/10/2017   Trauma during pregnancy 07/03/2017   Left shoulder pain 04/01/2016    PCP: Maryelizabeth Rowan MD  REFERRING PROVIDER: Andi Devon, DO   REFERRING DIAG: [phone] (ICD-10-CM) - Left hip pain   THERAPY DIAG:  Left hip pain  Weakness of left hip  Other abnormalities of gait and mobility  Rationale for Evaluation and Treatment: Rehabilitation  ONSET DATE: exacerbated last 4-5 months  SUBJECTIVE:   SUBJECTIVE STATEMENT: Pt  states she continues to exercise in a limited fashion. She continues to have pain in he rback and hip. Overall she feels like it is about the same.  She will have her lumbar MRI today.     Initial subjective Exercises 3-4 x week [employer]  training. Have been doing ok until last few months having trouble completing anything strenuous throughout left le. Pain started in my hip and now it is my left knee and ankle. Had a cortizone shot 10 years ago in hip due to an injury which made it better at the time. Has had 2 pregnancies since with c-sections.  Stopped running. Walking down step knee and ankle pop.  Seem to be getting better for a few months then just started getting worse as I ramped up weights  PERTINENT HISTORY: Evaluate and treat for left hip pain and glute weakness. Please include aquatic therapy and land based exercises in treatment plan. PAIN:  Are you having pain? Yes: NPRS scale: current 3/10 Pain location: left glute and anterior hip Pain description: dull ache Aggravating factors: exercises dead lifts; squats  feels unstable Relieving factors: advil  PRECAUTIONS: None  RED FLAGS: None   WEIGHT BEARING RESTRICTIONS: No  FALLS:  Has patient fallen in last 6 months? No  LIVING ENVIRONMENT: Lives with: lives with their family Lives in: House/apartment Stairs: Yes: Internal: 16 steps; on right going up Has following equipment at home: None  OCCUPATION: seated job  PLOF: Independent  PATIENT GOALS: no pain return to run, don't want to hurt myself further  NEXT MD VISIT: 6-8 weeks/prn  OBJECTIVE:  Note: Objective measures were completed at Evaluation  unless otherwise noted.  DIAGNOSTIC FINDINGS: DG Lumbar IMPRESSION: Negative.  PATIENT SURVEYS:  FOTO Primary score 69% with goal of 82% 11/25/23: 59%  COGNITION: Overall cognitive status: Within functional limits for tasks assessed     SENSATION: WFL   MUSCLE LENGTH: Hamstrings: wfl  POSTURE: No Significant postural limitations  PALPATION: TTP left iliopsoas/psoas; left Glut insertion   LOWER EXTREMITY ROM:  WFL  LOWER EXTREMITY MMT:  MMT Right eval Left eval Right  Left  Hip flexion 49.1 45.2 49.9 40.1  Hip extension 5/5 4/5     Hip abduction 22.6 23.9 39.9 35.3  Hip adduction      Hip internal rotation  4 no P!    Hip external rotation 5 4 P!    Knee flexion      Knee extension 50.9 53.4 P! Hip flex    Ankle dorsiflexion      Ankle plantarflexion      Ankle inversion      Ankle eversion       (Blank rows = not tested)  LOWER EXTREMITY SPECIAL TESTS:  Hip special tests: Luisa Hart (FABER) test: positive , Trendelenburg test: positive , Thomas test: negative, and SI distraction test: negative  FUNCTIONAL TESTS:  5 times sit to stand: 7.28   GAIT: Unlimited No visual trendelenburg with amb but tests positive with actual test.                                                                                                                                TREATMENT  3/28 Trigger Point Dry Needling  Initial Treatment: Pt instructed on Dry Needling rational, procedures, and possible side effects. Pt instructed to expect mild to moderate muscle soreness later in the day and/or into the next day.  Pt instructed in methods to reduce muscle soreness. Pt instructed to continue prescribed HEP. Because Dry Needling was performed over or adjacent to a lung field, pt was educated on S/S of pneumothorax and to seek immediate medical attention should they occur.  Patient was educated on signs and symptoms of infection and other risk factors and advised to seek medical attention should they occur.  Patient verbalized understanding of these instructions and education.   Patient Verbal Consent Given: Yes Education Handout Provided: Yes Muscles Treated: TFL 2 times using 0.30 X50 needle L4 and L5 left paraspinal  2 times using 0.30 X50 needle Electrical Stimulation Performed: No Treatment Response/Outcome: Great twitch response with each needle   Manual: skilled palpation of trigger points. Trigger point release to lumbar paraspinals and left gluteal and TFL.   There-ex:  Ex bike 5 min L2   Neuro re-ed:   Dead  lift 3x10 8 inch  Bridge with a band 2x10    [facility] Adult PT Treatment:  DATE: 11/25/23  Therapeutic Exercise: Long sitting -> on elbows- SLR into hip abdct/ addct x 10 LLE, x 5 RLE L adductor stretch L modified Thomas stretch with R knee towards chest x 30s x 2  Manual Therapy: IASTM to Lt ant/lateral thigh to decrease fascial restrictions TPR to Lt adductors MFR to L iliacus to decrease fascial restrictions and improve mobility  Self Care: Pt educated on self MFR to L iliopsoas with ball to hip in prone   3/19 STM rec fem and glute med on L Mulligan hip mob inf and lateral grade III; LAD  L1-5 CPA and L  UPA grade III Supine HS stretch 15s 3x Wall sit 90 deg 15s 4x Leg swing on box 8" 20x   3/17  Trigger Point Dry Needling  Subsequent Treatment: Instructions provided previously at initial dry needling treatment.   Patient Verbal Consent Given: Yes Education Handout Provided: Previously Provided Muscles Treated: L rec fem Electrical Stimulation Performed: No Treatment Response/Outcome: large LTR elicited  STM rec fem and glute med on L  Couch stretch 30s 3x Standing fire hydrant RTB at ankles 2x10  7lb RDL 3x8 at wall (hip hinge focus)  Box squat to max tolerable depth 3x8    3/5 Trigger Point Dry Needling  Initial Treatment: Pt instructed on Dry Needling rational, procedures, and possible side effects. Pt instructed to expect mild to moderate muscle soreness later in the day and/or into the next day.  Pt instructed in methods to reduce muscle soreness. Pt instructed to continue prescribed HEP. Because Dry Needling was performed over or adjacent to a lung field, pt was educated on S/S of pneumothorax and to seek immediate medical attention should they occur.  Patient was educated on signs and symptoms of infection and other risk factors and advised to seek medical attention should they occur.  Patient verbalized  understanding of these instructions and education.   Patient Verbal Consent Given: Yes Education Handout Provided: Yes Muscles Treated: TFL 2 times using 0.30 X50 needle glute medius 2 times using 0.30 X50 needle Electrical Stimulation Performed: No Treatment Response/Outcome: Great twitch response with each needle   Manual: skilled palpation of trigger points. Trigger point release to lumbar paraspinals and left gluteal  Nuero re-ed   March with abdominal brace 2x10  Lateral band walk x10 down and back  Bridge with band green 2x12 green. Could feel it in the back  Monster walk 2x10   Last visit   Manual Therapy: IASTM to L ant/lateral thigh (mid to proximal) to decrease fascial restrictions.  Skilled palpation of trigger points in the anterior hip and gluteal.  Neuromuscular re-ed: Ball press and hold 3 x 12 with TA breathing Double knee-to-chest in pain-free range 3 x 12 with TA breathing Bridge with green band and thighs, 3 sec pause, x 10, 2 sets Trigger Point Dry Needling  Initial Treatment: Pt instructed on Dry Needling rational, procedures, and possible side effects. Pt instructed to expect mild to moderate muscle soreness later in the day and/or into the next day.  Pt instructed in methods to reduce muscle soreness. Pt instructed to continue prescribed HEP. Because Dry Needling was performed over or adjacent to a lung field, pt was educated on S/S of pneumothorax and to seek immediate medical attention should they occur.  Patient was educated on signs and symptoms of infection and other risk factors and advised to seek medical attention should they occur.  Patient verbalized understanding of these instructions and education.  Patient Verbal Consent Given: Yes Education Handout Provided: Yes Muscles Treated: TFL 2 times using 0.30 X50 needle glute medius 2 times using 0.30 X50 needle Electrical Stimulation Performed: No Treatment Response/Outcome: Great twitch response  with each needle     3/3 Therapeutic Exercise: Upright bike, L2, x 6 min Lt Thomas stretch 2 x 30s (3rd rep holding R knee to chest) L Knee extension machine 3x10 15 lbs  Repeat of Thomas stretch x 30 sec (after IASTM)  Manual Therapy: IASTM to L ant/lateral thigh (mid to proximal) to decrease fascial restrictions        PATIENT EDUCATION:  Education details:exercise rationale, progressions/ modifications; self care (massage)  Person educated: Patient Education method: Explanation Education comprehension: verbalized understanding  HOME EXERCISE PROGRAM: [id number]  ASSESSMENT:  CLINICAL IMPRESSION:   Therapy needled the patients lumbar spine today. She had a great twitch. She was able to perform a dead lift today from a raised elevation. We will proceed per MRI results.    Initial impression Patient is a 38 y.o. f who was seen today for physical therapy evaluation and treatment for Left hip pain. Pt reports old injury x 10 years ago which resolved after cortizone injection.  It has given her issue off and on over the past years but exacerbated in the last 4 months. She is an active exerciser and has 2 small children.  Reports she has continued with her exercise regimen backing off some when left hip/knee and ankle begin to hurt. She has stopped running altogether. Xray is neg for lumbar spine dysfunction.  She has positive Trendelenburg sign, demonstrates left glut weakness, does not appear to have SI involvement at todays assessment. Left iliopsoas/psoas muscle tightness present with palpation as well as some tenderness. 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  She will benefit from skilled physical therapy to improve left glut/hip complex strength to normalize bio mechanic stability.  Plan to spend some time in aquatics but majority of session land  based.  OBJECTIVE IMPAIRMENTS: decreased activity tolerance, decreased strength, increased muscle spasms, and pain.   ACTIVITY LIMITATIONS: lifting, squatting, and stairs  PARTICIPATION LIMITATIONS:  normal exercise regimen  PERSONAL FACTORS: Fitness and Time since onset of injury/illness/exacerbation are also affecting patient's functional outcome.   REHAB POTENTIAL: Good  CLINICAL DECISION MAKING: Evolving/moderate complexity  EVALUATION COMPLEXITY: Moderate   GOALS: Goals reviewed with patient? Yes  SHORT TERM GOALS: Target date: 10/12/23 Pt will report decreased pain with exercise submerged to <2/10 and in positions land based which cause pain Baseline:squats/hip flex, descending steps; 6/10 Goal status: NOT MET 10/23/23  2.  Pt will demonstrate indep with aquatic exercises and will make decision on best setting for exercise going forward.  Aquatic HEP to be assigned as approp. Baseline:  Goal status: met 10/23/23   LONG TERM GOALS: Target date: 11/20/23  Pt to meet stated Foto Goal of 82% Baseline: 69%; see above  Goal status: In progress  2.  Pt will tolerate running short distance (under 1 mile) Baseline:  Goal status: Not met - 11/25/23  3.  Pt will descend 1 flight of steps without pain Baseline: painful, but NO increase in pain Goal status: Partially met - 11/25/23  4.  Pt will improve strength in Left hip = to contralateral side to demonstrate improved biomechanical stability of hip Baseline: see above Goal status: In progress -  11/25/23  5.  Pt will return to indep regimen of exercises without limitation due to weakness or pain Baseline: limited;  modifying gym routine Goal status: In progress - 11/25/23  6.  Pt will amb without trendelenburg left hip Baseline: positive tendelenburg Goal status: In progress -11/25/23   PLAN:  PT FREQUENCY: 2x/week  PT DURATION: 8 weeks  PLANNED INTERVENTIONS: 97164- PT Re-evaluation, 97110-Therapeutic exercises,  97530- Therapeutic activity, 97112- Neuromuscular re-education, 97535- Self Care, 16109- Manual therapy, [phone]- Gait training, [phone]- Orthotic Fit/training, [phone]- Aquatic Therapy, [phone]- Electrical stimulation (unattended), [phone]- Ionotophoresis 4mg /ml Dexamethasone, Patient/Family education, Balance training, Stair training, Taping, Dry Needling, Joint mobilization, DME instructions, Cryotherapy, and Moist heat  PLAN FOR NEXT SESSION: hip strengthening special attention to glutes, knee strengthening, gait training, land based consider DN; stretching program special attention to hip flex, pain control.  Mayer Camel, PTA 11/27/23 9:17 PM [facility] [address] [place], [place], [address] Phone: [phone]   Fax:  [phone]

## 2023-11-30 ENCOUNTER — Encounter (HOSPITAL_BASED_OUTPATIENT_CLINIC_OR_DEPARTMENT_OTHER): Payer: Self-pay | Admitting: Physical Therapy

## 2023-11-30 ENCOUNTER — Ambulatory Visit (HOSPITAL_BASED_OUTPATIENT_CLINIC_OR_DEPARTMENT_OTHER): Admitting: Physical Therapy

## 2023-11-30 DIAGNOSIS — M25552 Pain in left hip: Secondary | ICD-10-CM | POA: Diagnosis not present

## 2023-11-30 DIAGNOSIS — R29898 Other symptoms and signs involving the musculoskeletal system: Secondary | ICD-10-CM | POA: Diagnosis not present

## 2023-11-30 DIAGNOSIS — R2689 Other abnormalities of gait and mobility: Secondary | ICD-10-CM | POA: Diagnosis not present

## 2023-11-30 NOTE — Therapy (Signed)
 OUTPATIENT PHYSICAL THERAPY LOWER EXTREMITY TREATMENT   Patient Name: Jasmine Conrad MRN: [id number] DOB:15-Jun-1986, 38 y.o., female Today's Date: 11/30/2023  END OF SESSION:  PT End of Session - 11/30/23 0834     Visit Number 15    Number of Visits 28    Date for PT Re-Evaluation 01/22/24    PT Start Time 0803    PT Stop Time 0845    PT Time Calculation (min) 42 min    Activity Tolerance Patient tolerated treatment well    Behavior During Therapy [facility] for tasks assessed/performed               Past Medical History:  Diagnosis Date   Depression    Fibroid    Medical history non-contributory    Past Surgical History:  Procedure Laterality Date   CESAREAN SECTION N/A 09/10/2017   Procedure: CESAREAN SECTION;  Surgeon: Mitchel Honour, DO;  Location: [facility];  Service: Obstetrics;  Laterality: N/A;  Primary edc 09/16/17 NKDA [name] RNFA   CESAREAN SECTION N/A 08/29/2019   Procedure: CESAREAN SECTION;  Surgeon: Mitchel Honour, DO;  Location: [facility];  Service: Obstetrics;  Laterality: N/A;  REPEAT EDC 09/08/19 NKDA [name],  RNFA   WISDOM TOOTH EXTRACTION     Patient Active Problem List   Diagnosis Date Noted   Previous cesarean section 08/29/2019   S/P cesarean section 09/10/2017   Trauma during pregnancy 07/03/2017   Left shoulder pain 04/01/2016    PCP: Maryelizabeth Rowan MD  REFERRING PROVIDER: Andi Devon, DO   REFERRING DIAG: [phone] (ICD-10-CM) - Left hip pain   THERAPY DIAG:  Left hip pain  Weakness of left hip  Other abnormalities of gait and mobility  Rationale for Evaluation and Treatment: Rehabilitation  ONSET DATE: exacerbated last 4-5 months  SUBJECTIVE:   SUBJECTIVE STATEMENT: Pt states that the hip feels better with modified squats and walls sits. Deadlifts seems to be aggravating. Pain is worse after deadlifts. Pain is located anterior but largely posteriorly and into HS area. Pt felt mild improvement with TPDN last  session.    Initial subjective 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 Have been doing ok until last few months having trouble completing anything strenuous throughout left le. Pain started in my hip and now it is my left knee and ankle. Had a cortizone shot 10 years ago in hip due to an injury which made it better at the time. Has had 2 pregnancies since with c-sections.  Stopped running. Walking down step knee and ankle pop.  Seem to be getting better for a few months then just started getting worse as I ramped up weights  PERTINENT HISTORY: Evaluate and treat for left hip pain and glute weakness. Please include aquatic therapy and land based exercises in treatment plan. PAIN:  Are you having pain? Yes: NPRS scale: current 2/10 Pain location: left glute and anterior hip Pain description: dull ache Aggravating factors: exercises dead lifts; squats  feels unstable Relieving factors: advil  PRECAUTIONS: None  RED FLAGS: None   WEIGHT BEARING RESTRICTIONS: No  FALLS:  Has patient fallen in last 6 months? No  LIVING ENVIRONMENT: Lives with: lives with their family Lives in: House/apartment Stairs: Yes: Internal: 16 steps; on right going up Has following equipment at home: None  OCCUPATION: seated job  PLOF: Independent  PATIENT GOALS: no pain return to run, don't want to hurt myself further  NEXT MD VISIT: 6-8 weeks/prn  OBJECTIVE:  Note: Objective measures  were completed at Evaluation unless otherwise noted.  DIAGNOSTIC FINDINGS: DG Lumbar IMPRESSION: Negative.  PATIENT SURVEYS:  FOTO Primary score 69% with goal of 82% 11/25/23: 59%  COGNITION: Overall cognitive status: Within functional limits for tasks assessed     SENSATION: WFL   MUSCLE LENGTH: Hamstrings: wfl  POSTURE: No Significant postural limitations  PALPATION: TTP left iliopsoas/psoas; left Glut insertion   LOWER EXTREMITY ROM:  WFL  LOWER EXTREMITY MMT:  MMT Right eval Left eval  Right  Left  Hip flexion 49.1 45.2 49.9 40.1  Hip extension 5/5 4/5    Hip abduction 22.6 23.9 39.9 35.3  Hip adduction      Hip internal rotation  4 no P!    Hip external rotation 5 4 P!    Knee flexion      Knee extension 50.9 53.4 P! Hip flex    Ankle dorsiflexion      Ankle plantarflexion      Ankle inversion      Ankle eversion       (Blank rows = not tested)  LOWER EXTREMITY SPECIAL TESTS:  Hip special tests: Luisa Hart (FABER) test: positive , Trendelenburg test: positive , Thomas test: negative, and SI distraction test: negative  FUNCTIONAL TESTS:  5 times sit to stand: 7.28   GAIT: Unlimited No visual trendelenburg with amb but tests positive with actual test.                                                                                                                                TREATMENT  3/31  STM glute and deep hip rotators with IR/ER  L hip flexor STM through lower abdomen, ischemic pressure  L LE LAD in open pack 3-4 mins  L1-5 CPA and L  UPA grade III  Supine hip flexor march YTB loop at toe box 2x10 3s holds Latreal step down YTB at knees 3x8   MRI results, evidence around imaging, conservative management options, timelines for tendon related pain, imaging report review and anatomical considerations   3/28 Trigger Point Dry Needling  Initial Treatment: Pt instructed on Dry Needling rational, procedures, and possible side effects. Pt instructed to expect mild to moderate muscle soreness later in the day and/or into the next day.  Pt instructed in methods to reduce muscle soreness. Pt instructed to continue prescribed HEP. Because Dry Needling was performed over or adjacent to a lung field, pt was educated on S/S of pneumothorax and to seek immediate medical attention should they occur.  Patient was educated on signs and symptoms of infection and other risk factors and advised to seek medical attention should they occur.  Patient verbalized  understanding of these instructions and education.   Patient Verbal Consent Given: Yes Education Handout Provided: Yes Muscles Treated: TFL 2 times using 0.30 X50 needle L4 and L5 left paraspinal  2 times using 0.30 X50 needle Electrical Stimulation Performed: No Treatment Response/Outcome: Great twitch response  with each needle   Manual: skilled palpation of trigger points. Trigger point release to lumbar paraspinals and left gluteal and TFL.   There-ex:  Ex bike 5 min L2   Neuro re-ed:   Dead lift 3x10 8 inch  Bridge with a band 2x10    [facility] Adult PT Treatment:                                                DATE: 11/25/23  Therapeutic Exercise: Long sitting -> on elbows- SLR into hip abdct/ addct x 10 LLE, x 5 RLE L adductor stretch L modified Thomas stretch with R knee towards chest x 30s x 2  Manual Therapy: IASTM to Lt ant/lateral thigh to decrease fascial restrictions TPR to Lt adductors MFR to L iliacus to decrease fascial restrictions and improve mobility  Self Care: Pt educated on self MFR to L iliopsoas with ball to hip in prone   3/19 STM rec fem and glute med on L Mulligan hip mob inf and lateral grade III; LAD  L1-5 CPA and L  UPA grade III Supine HS stretch 15s 3x Wall sit 90 deg 15s 4x Leg swing on box 8" 20x   3/17  Trigger Point Dry Needling  Subsequent Treatment: Instructions provided previously at initial dry needling treatment.   Patient Verbal Consent Given: Yes Education Handout Provided: Previously Provided Muscles Treated: L rec fem Electrical Stimulation Performed: No Treatment Response/Outcome: large LTR elicited  STM rec fem and glute med on L  Couch stretch 30s 3x Standing fire hydrant RTB at ankles 2x10  7lb RDL 3x8 at wall (hip hinge focus)  Box squat to max tolerable depth 3x8    3/5 Trigger Point Dry Needling  Initial Treatment: Pt instructed on Dry Needling rational, procedures, and possible side effects. Pt  instructed to expect mild to moderate muscle soreness later in the day and/or into the next day.  Pt instructed in methods to reduce muscle soreness. Pt instructed to continue prescribed HEP. Because Dry Needling was performed over or adjacent to a lung field, pt was educated on S/S of pneumothorax and to seek immediate medical attention should they occur.  Patient was educated on signs and symptoms of infection and other risk factors and advised to seek medical attention should they occur.  Patient verbalized understanding of these instructions and education.   Patient Verbal Consent Given: Yes Education Handout Provided: Yes Muscles Treated: TFL 2 times using 0.30 X50 needle glute medius 2 times using 0.30 X50 needle Electrical Stimulation Performed: No Treatment Response/Outcome: Great twitch response with each needle   Manual: skilled palpation of trigger points. Trigger point release to lumbar paraspinals and left gluteal  Nuero re-ed   March with abdominal brace 2x10  Lateral band walk x10 down and back  Bridge with band green 2x12 green. Could feel it in the back  Monster walk 2x10    PATIENT EDUCATION:  Education details:exercise rationale, progressions/ modifications; self care (massage)  Person educated: Patient Education method: Explanation Education comprehension: verbalized understanding  HOME EXERCISE PROGRAM: [id number]  ASSESSMENT:  CLINICAL IMPRESSION:   Pt with reports of decrease in stiffness and anterior hip sensitivity following manual therapy. Pt able to progressively load single leg stability and motor control exercise in addition to hip flexion isometrics without increase in pain. R LE visibly stronger and more stability  with lateral step down. L hip requires external cuing with band in order to maintain proper alignment during motion. Pt advised that pain below at 4/10 is acceptable during exercise and should not linger post-activity. Consider check of knee  flexion intolerance and ankle DF ROM at next to assess for hip IR/ADD compensation. Consider kneeling hip hinge to desensitize hip hinge position if knee is able tolerate such deep degrees of flexion. Plan to continue with progressive exercise at this time for L lateral hip and anterior hip strength/motor control. Pt would benefit from continued skilled therapy in order to reach goals and maximize functional L LE strength and ROM for full return to PLOF and exercise.    Initial impression Patient is a 38 y.o. f who was seen today for physical therapy evaluation and treatment for Left hip pain. Pt reports old injury x 10 years ago which resolved after cortizone injection.  It has given her issue off and on over the past years but exacerbated in the last 4 months. She is an active exerciser and has 2 small children.  Reports she has continued with her exercise regimen backing off some when left hip/knee and ankle begin to hurt. She has stopped running altogether. Xray is neg for lumbar spine dysfunction.  She has positive Trendelenburg sign, demonstrates left glut weakness, does not appear to have SI involvement at todays assessment. Left iliopsoas/psoas muscle tightness present with palpation as well as some tenderness. 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  She will benefit from skilled physical therapy to improve left glut/hip complex strength to normalize bio mechanic stability.  Plan to spend some time in aquatics but majority of session land based.  OBJECTIVE IMPAIRMENTS: decreased activity tolerance, decreased strength, increased muscle spasms, and pain.   ACTIVITY LIMITATIONS: lifting, squatting, and stairs  PARTICIPATION LIMITATIONS:  normal exercise regimen  PERSONAL FACTORS: Fitness and Time since onset of injury/illness/exacerbation are also affecting patient's functional  outcome.   REHAB POTENTIAL: Good  CLINICAL DECISION MAKING: Evolving/moderate complexity  EVALUATION COMPLEXITY: Moderate   GOALS: Goals reviewed with patient? Yes  SHORT TERM GOALS: Target date: 10/12/23 Pt will report decreased pain with exercise submerged to <2/10 and in positions land based which cause pain Baseline:squats/hip flex, descending steps; 6/10 Goal status: NOT MET 10/23/23  2.  Pt will demonstrate indep with aquatic exercises and will make decision on best setting for exercise going forward.  Aquatic HEP to be assigned as approp. Baseline:  Goal status: met 10/23/23   LONG TERM GOALS: Target date: 11/20/23  Pt to meet stated Foto Goal of 82% Baseline: 69%; see above  Goal status: In progress  2.  Pt will tolerate running short distance (under 1 mile) Baseline:  Goal status: Not met - 11/25/23  3.  Pt will descend 1 flight of steps without pain Baseline: painful, but NO increase in pain Goal status: Partially met - 11/25/23  4.  Pt will improve strength in Left hip = to contralateral side to demonstrate improved biomechanical stability of hip Baseline: see above Goal status: In progress - 11/25/23  5.  Pt will return to indep regimen of exercises without limitation due to weakness or pain Baseline: limited;  modifying gym routine Goal status: In progress - 11/25/23  6.  Pt will amb without trendelenburg left hip Baseline: positive tendelenburg Goal status: In progress -11/25/23  PLAN:  PT FREQUENCY: 2x/week  PT DURATION: 8 weeks  PLANNED INTERVENTIONS: 97164- PT Re-evaluation, 97110-Therapeutic exercises, 97530- Therapeutic activity, 97112- Neuromuscular re-education, 97535- Self Care, 16109- Manual therapy, [phone]- Gait training, [phone]- Orthotic Fit/training, [phone]- Aquatic Therapy, 97014- Electrical stimulation (unattended), [phone]- Ionotophoresis 4mg /ml Dexamethasone, Patient/Family education, Balance training, Stair training, Taping, Dry Needling, Joint  mobilization, DME instructions, Cryotherapy, and Moist heat  PLAN FOR NEXT SESSION: hip strengthening special attention to glutes, knee strengthening, gait training, land based consider DN; stretching program special attention to hip flex, pain control.  Mayer Camel, PTA 11/30/23 9:49 AM [facility] [address] [place], [place], [address] Phone: [phone]   Fax:  [phone]

## 2023-12-03 ENCOUNTER — Ambulatory Visit (HOSPITAL_BASED_OUTPATIENT_CLINIC_OR_DEPARTMENT_OTHER): Attending: Family Medicine | Admitting: Physical Therapy

## 2023-12-03 ENCOUNTER — Encounter (HOSPITAL_BASED_OUTPATIENT_CLINIC_OR_DEPARTMENT_OTHER): Payer: Self-pay | Admitting: Physical Therapy

## 2023-12-03 DIAGNOSIS — R2689 Other abnormalities of gait and mobility: Secondary | ICD-10-CM | POA: Insufficient documentation

## 2023-12-03 DIAGNOSIS — M25552 Pain in left hip: Secondary | ICD-10-CM | POA: Insufficient documentation

## 2023-12-03 DIAGNOSIS — R29898 Other symptoms and signs involving the musculoskeletal system: Secondary | ICD-10-CM | POA: Diagnosis not present

## 2023-12-03 NOTE — Therapy (Signed)
 OUTPATIENT PHYSICAL THERAPY LOWER EXTREMITY TREATMENT   Patient Name: Jasmine Conrad MRN: [id number] DOB:06/25/1986, 38 y.o., female Today's Date: 12/03/2023  END OF SESSION:  PT End of Session - 12/03/23 0725     Visit Number 16    Number of Visits 28    Date for PT Re-Evaluation 01/22/24    Authorization Type [employer]    PT Start Time 0820    PT Stop Time 0858    PT Time Calculation (min) 38 min    Activity Tolerance Patient tolerated treatment well    Behavior During Therapy [facility] for tasks assessed/performed               Past Medical History:  Diagnosis Date   Depression    Fibroid    Medical history non-contributory    Past Surgical History:  Procedure Laterality Date   CESAREAN SECTION N/A 09/10/2017   Procedure: CESAREAN SECTION;  Surgeon: Mitchel Honour, DO;  Location: [facility];  Service: Obstetrics;  Laterality: N/A;  Primary edc 09/16/17 NKDA [name] RNFA   CESAREAN SECTION N/A 08/29/2019   Procedure: CESAREAN SECTION;  Surgeon: Mitchel Honour, DO;  Location: [facility];  Service: Obstetrics;  Laterality: N/A;  REPEAT EDC 09/08/19 NKDA [name],  RNFA   WISDOM TOOTH EXTRACTION     Patient Active Problem List   Diagnosis Date Noted   Previous cesarean section 08/29/2019   S/P cesarean section 09/10/2017   Trauma during pregnancy 07/03/2017   Left shoulder pain 04/01/2016    PCP: Maryelizabeth Rowan MD  REFERRING PROVIDER: Andi Devon, DO   REFERRING DIAG: [phone] (ICD-10-CM) - Left hip pain   THERAPY DIAG:  No diagnosis found.  Rationale for Evaluation and Treatment: Rehabilitation  ONSET DATE: exacerbated last 4-5 months  SUBJECTIVE:   SUBJECTIVE STATEMENT: Pt states that she felt a "good soreness" in her L glute after completing exercises with Hessie Diener last session.  Sore, but not angry.  She has tried the self-MFR with ball to L psoas, "it's hard to find the right spot".    Initial subjective 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  Have been doing ok until last few months having trouble completing anything strenuous throughout left le. Pain started in my hip and now it is my left knee and ankle. Had a cortizone shot 10 years ago in hip due to an injury which made it better at the time. Has had 2 pregnancies since with c-sections.  Stopped running. Walking down step knee and ankle pop.  Seem to be getting better for a few months then just started getting worse as I ramped up weights  PERTINENT HISTORY: Evaluate and treat for left hip pain and glute weakness. Please include aquatic therapy and land based exercises in treatment plan. PAIN:  Are you having pain? Yes: NPRS scale: current 1/10 Pain location: left glute and anterior hip Pain description: dull ache Aggravating factors: exercises dead lifts; squats  feels unstable Relieving factors: advil  PRECAUTIONS: None  RED FLAGS: None   WEIGHT BEARING RESTRICTIONS: No  FALLS:  Has patient fallen in last 6 months? No  LIVING ENVIRONMENT: Lives with: lives with their family Lives in: House/apartment Stairs: Yes: Internal: 16 steps; on right going up Has following equipment at home: None  OCCUPATION: seated job  PLOF: Independent  PATIENT GOALS: no pain return to run, don't want to hurt myself further  NEXT MD VISIT: 6-8 weeks/prn  OBJECTIVE:  Note: Objective measures were completed at Evaluation unless otherwise  noted.  DIAGNOSTIC FINDINGS: DG Lumbar IMPRESSION: Negative.  PATIENT SURVEYS:  FOTO Primary score 69% with goal of 82% 11/25/23: 59%  COGNITION: Overall cognitive status: Within functional limits for tasks assessed     SENSATION: WFL   MUSCLE LENGTH: Hamstrings: wfl  POSTURE: No Significant postural limitations  PALPATION: TTP left iliopsoas/psoas; left Glut insertion   LOWER EXTREMITY ROM:  [facility]  LOWER EXTREMITY MMT:  MMT Right eval Left eval Right  11/25/23 Left 11/25/23  Hip flexion 49.1 45.2 49.9 40.1  Hip extension  5/5 4/5    Hip abduction 22.6 23.9 39.9 35.3  Hip adduction      Hip internal rotation  4 no P!    Hip external rotation 5 4 P!    Knee flexion      Knee extension 50.9 53.4 P! Hip flex    Ankle dorsiflexion      Ankle plantarflexion      Ankle inversion      Ankle eversion       (Blank rows = not tested)  LOWER EXTREMITY SPECIAL TESTS:  Hip special tests: Luisa Hart (FABER) test: positive , Trendelenburg test: positive , Thomas test: negative, and SI distraction test: negative  FUNCTIONAL TESTS:  5 times sit to stand: 7.28   GAIT: Unlimited No visual trendelenburg with amb but tests positive with actual test.                                                                                                                                TREATMENT  [facility] Adult PT Treatment:                                                DATE: 12/03/23  Therapeutic Exercise: Prone pelvic press 5 sec hold x 5 -> with unilateral knee bends x 5; with unilateral hip ext x 5 each Childs pose / cat cow L/R hamstring and adductor stretch with strap  SLR on elbows into hip abdct/ addct x 10 each LE Kneeling hip hinge with cues for neutral pelvis x 10 (slow) Childs pose  Primal push up x 20s -> with L hip ext x 2, R hip ext x 1, -> shoulder taps x 2 each shoulder  Manual Therapy: TPR to L lower lumbar paraspinals STM to L hamstring /  adductors   3/31  STM glute and deep hip rotators with IR/ER  L hip flexor STM through lower abdomen, ischemic pressure  L LE LAD in open pack 3-4 mins  L1-5 CPA and L  UPA grade III  Supine hip flexor march YTB loop at toe box 2x10 3s holds Latreal step down YTB at knees 3x8   MRI results, evidence around imaging, conservative management options, timelines for tendon related pain, imaging report review and anatomical considerations  3/28 Trigger Point Dry Needling  Initial Treatment: Pt instructed on Dry Needling rational, procedures, and possible side  effects. Pt instructed to expect mild to moderate muscle soreness later in the day and/or into the next day.  Pt instructed in methods to reduce muscle soreness. Pt instructed to continue prescribed HEP. Because Dry Needling was performed over or adjacent to a lung field, pt was educated on S/S of pneumothorax and to seek immediate medical attention should they occur.  Patient was educated on signs and symptoms of infection and other risk factors and advised to seek medical attention should they occur.  Patient verbalized understanding of these instructions and education.   Patient Verbal Consent Given: Yes Education Handout Provided: Yes Muscles Treated: TFL 2 times using 0.30 X50 needle L4 and L5 left paraspinal  2 times using 0.30 X50 needle Electrical Stimulation Performed: No Treatment Response/Outcome: Great twitch response with each needle   Manual: skilled palpation of trigger points. Trigger point release to lumbar paraspinals and left gluteal and TFL.   There-ex:  Ex bike 5 min L2   Neuro re-ed:   Dead lift 3x10 8 inch  Bridge with a band 2x10    [facility] Adult PT Treatment:                                                DATE: 11/25/23  Therapeutic Exercise: Long sitting -> on elbows- SLR into hip abdct/ addct x 10 LLE, x 5 RLE L adductor stretch L modified Thomas stretch with R knee towards chest x 30s x 2  Manual Therapy: IASTM to Lt ant/lateral thigh to decrease fascial restrictions TPR to Lt adductors MFR to L iliacus to decrease fascial restrictions and improve mobility  Self Care: Pt educated on self MFR to L iliopsoas with ball to hip in prone   3/19 STM rec fem and glute med on L Mulligan hip mob inf and lateral grade III; LAD  L1-5 CPA and L  UPA grade III Supine HS stretch 15s 3x Wall sit 90 deg 15s 4x Leg swing on box 8" 20x   3/17  Trigger Point Dry Needling  Subsequent Treatment: Instructions provided previously at initial dry needling  treatment.   Patient Verbal Consent Given: Yes Education Handout Provided: Previously Provided Muscles Treated: L rec fem Electrical Stimulation Performed: No Treatment Response/Outcome: large LTR elicited  STM rec fem and glute med on L  Couch stretch 30s 3x Standing fire hydrant RTB at ankles 2x10  7lb RDL 3x8 at wall (hip hinge focus)  Box squat to max tolerable depth 3x8    3/5 Trigger Point Dry Needling  Initial Treatment: Pt instructed on Dry Needling rational, procedures, and possible side effects. Pt instructed to expect mild to moderate muscle soreness later in the day and/or into the next day.  Pt instructed in methods to reduce muscle soreness. Pt instructed to continue prescribed HEP. Because Dry Needling was performed over or adjacent to a lung field, pt was educated on S/S of pneumothorax and to seek immediate medical attention should they occur.  Patient was educated on signs and symptoms of infection and other risk factors and advised to seek medical attention should they occur.  Patient verbalized understanding of these instructions and education.   Patient Verbal Consent Given: Yes Education Handout Provided: Yes Muscles Treated: TFL 2 times using 0.30  X50 needle glute medius 2 times using 0.30 X50 needle Electrical Stimulation Performed: No Treatment Response/Outcome: Great twitch response with each needle   Manual: skilled palpation of trigger points. Trigger point release to lumbar paraspinals and left gluteal  Nuero re-ed   March with abdominal brace 2x10  Lateral band walk x10 down and back  Bridge with band green 2x12 green. Could feel it in the back  Monster walk 2x10    PATIENT EDUCATION:  Education details:exercise rationale, progressions/ modifications;  Person educated: Patient Education method: Explanation Education comprehension: verbalized understanding  HOME EXERCISE PROGRAM: [id number]  ASSESSMENT:  CLINICAL IMPRESSION: Palpable  tightness and reported tenderness in pt's Lt distal lateral hamstring with STM.  Good tolerance for exercises completed today.  Focus on lumbar strengthening and core stability. Recommended pt continue assigned HEP.    Pt reminded that pain below at 4/10 is acceptable during exercise and should not linger post-activity.  Plan to continue with progressive exercise at this time for L lateral hip and anterior hip strength/motor control. Pt would benefit from continued skilled therapy in order to reach goals and maximize functional L LE strength and ROM for full return to PLOF and exercise.  Consider check of knee flexion intolerance and ankle DF ROM at next to assess for hip IR/ADD compensation.    Initial impression Patient is a 38 y.o. f who was seen today for physical therapy evaluation and treatment for Left hip pain. Pt reports old injury x 10 years ago which resolved after cortizone injection.  It has given her issue off and on over the past years but exacerbated in the last 4 months. She is an active exerciser and has 2 small children.  Reports she has continued with her exercise regimen backing off some when left hip/knee and ankle begin to hurt. She has stopped running altogether. Xray is neg for lumbar spine dysfunction.  She has positive Trendelenburg sign, demonstrates left glut weakness, does not appear to have SI involvement at todays assessment. Left iliopsoas/psoas muscle tightness present with palpation as well as some tenderness. 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  She will benefit from skilled physical therapy to improve left glut/hip complex strength to normalize bio mechanic stability.  Plan to spend some time in aquatics but majority of session land based.  OBJECTIVE IMPAIRMENTS: decreased activity tolerance, decreased strength, increased muscle spasms, and pain.    ACTIVITY LIMITATIONS: lifting, squatting, and stairs  PARTICIPATION LIMITATIONS:  normal exercise regimen  PERSONAL FACTORS: Fitness and Time since onset of injury/illness/exacerbation are also affecting patient's functional outcome.   REHAB POTENTIAL: Good  CLINICAL DECISION MAKING: Evolving/moderate complexity  EVALUATION COMPLEXITY: Moderate   GOALS: Goals reviewed with patient? Yes  SHORT TERM GOALS: Target date: 10/12/23 Pt will report decreased pain with exercise submerged to <2/10 and in positions land based which cause pain Baseline:squats/hip flex, descending steps; 6/10 Goal status: NOT MET 10/23/23  2.  Pt will demonstrate indep with aquatic exercises and will make decision on best setting for exercise going forward.  Aquatic HEP to be assigned as approp. Baseline:  Goal status: met 10/23/23   LONG TERM GOALS: Target date: 01/22/24  Pt to meet stated Foto Goal of 82% Baseline: 69%; see above  Goal status: In progress  2.  Pt will tolerate running short distance (under 1 mile) Baseline:  Goal status: Not met -  11/25/23  3.  Pt will descend 1 flight of steps without pain Baseline: painful, but NO increase in pain Goal status: Partially met - 11/25/23  4.  Pt will improve strength in Left hip = to contralateral side to demonstrate improved biomechanical stability of hip Baseline: see above Goal status: In progress - 11/25/23  5.  Pt will return to indep regimen of exercises without limitation due to weakness or pain Baseline: limited;  modifying gym routine Goal status: In progress - 11/25/23  6.  Pt will amb without trendelenburg left hip Baseline: positive tendelenburg Goal status: In progress -11/25/23   PLAN:  PT FREQUENCY: 2x/week  PT DURATION: 8 weeks  PLANNED INTERVENTIONS: 97164- PT Re-evaluation, 97110-Therapeutic exercises, 97530- Therapeutic activity, 97112- Neuromuscular re-education, 97535- Self Care, 53664- Manual therapy, [phone]- Gait  training, [phone]- Orthotic Fit/training, [phone]- Aquatic Therapy, [phone]- Electrical stimulation (unattended), [phone]- Ionotophoresis 4mg /ml Dexamethasone, Patient/Family education, Balance training, Stair training, Taping, Dry Needling, Joint mobilization, DME instructions, Cryotherapy, and Moist heat  PLAN FOR NEXT SESSION: hip strengthening special attention to glutes, knee strengthening, gait training, land based consider DN; stretching program special attention to hip flex, pain control.  Mayer Camel, PTA 12/03/23 11:27 AM [facility] [address] [place], [place], [address] Phone: [phone]   Fax:  [phone]

## 2023-12-08 ENCOUNTER — Other Ambulatory Visit: Payer: Self-pay

## 2023-12-08 ENCOUNTER — Encounter (HOSPITAL_BASED_OUTPATIENT_CLINIC_OR_DEPARTMENT_OTHER): Payer: Self-pay | Admitting: Physical Therapy

## 2023-12-08 ENCOUNTER — Ambulatory Visit (HOSPITAL_BASED_OUTPATIENT_CLINIC_OR_DEPARTMENT_OTHER): Admitting: Physical Therapy

## 2023-12-08 ENCOUNTER — Encounter: Payer: Self-pay | Admitting: Family Medicine

## 2023-12-08 DIAGNOSIS — R2689 Other abnormalities of gait and mobility: Secondary | ICD-10-CM | POA: Diagnosis not present

## 2023-12-08 DIAGNOSIS — M25552 Pain in left hip: Secondary | ICD-10-CM | POA: Diagnosis not present

## 2023-12-08 DIAGNOSIS — R29898 Other symptoms and signs involving the musculoskeletal system: Secondary | ICD-10-CM

## 2023-12-08 DIAGNOSIS — M7918 Myalgia, other site: Secondary | ICD-10-CM

## 2023-12-08 NOTE — Therapy (Signed)
 OUTPATIENT PHYSICAL THERAPY LOWER EXTREMITY TREATMENT   Patient Name: Jasmine Conrad MRN: [id number] DOB:Mar 22, 1986, 38 y.o., female Today's Date: 12/08/2023  END OF SESSION:  PT End of Session - 12/08/23 0719     Visit Number 17    Number of Visits 28    Date for PT Re-Evaluation 01/22/24    Authorization Type [employer]    PT Start Time 0716    PT Stop Time 0755    PT Time Calculation (min) 39 min    Activity Tolerance Patient tolerated treatment well    Behavior During Therapy [facility] for tasks assessed/performed               Past Medical History:  Diagnosis Date   Depression    Fibroid    Medical history non-contributory    Past Surgical History:  Procedure Laterality Date   CESAREAN SECTION N/A 09/10/2017   Procedure: CESAREAN SECTION;  Surgeon: Mitchel Honour, DO;  Location: [facility];  Service: Obstetrics;  Laterality: N/A;  Primary edc 09/16/17 NKDA [name] RNFA   CESAREAN SECTION N/A 08/29/2019   Procedure: CESAREAN SECTION;  Surgeon: Mitchel Honour, DO;  Location: [facility];  Service: Obstetrics;  Laterality: N/A;  REPEAT EDC 09/08/19 NKDA [name],  RNFA   WISDOM TOOTH EXTRACTION     Patient Active Problem List   Diagnosis Date Noted   Previous cesarean section 08/29/2019   S/P cesarean section 09/10/2017   Trauma during pregnancy 07/03/2017   Left shoulder pain 04/01/2016    PCP: Maryelizabeth Rowan MD  REFERRING PROVIDER: Andi Devon, DO   REFERRING DIAG: [phone] (ICD-10-CM) - Left hip pain   THERAPY DIAG:  Left hip pain  Weakness of left hip  Other abnormalities of gait and mobility  Rationale for Evaluation and Treatment: Rehabilitation  ONSET DATE: exacerbated last 4-5 months  SUBJECTIVE:   SUBJECTIVE STATEMENT: Pt states her L hip pain remained at 1/10 for a week, noting longest amount of relief in a while.   Pain spiked back up to 3/10 with deadlifts yesterday,  after completing hip exercises from Hessie Diener (which are going well).   L hip remained "angry" over night.    Initial subjective 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 Have been doing ok until last few months having trouble completing anything strenuous throughout left le. Pain started in my hip and now it is my left knee and ankle. Had a cortizone shot 10 years ago in hip due to an injury which made it better at the time. Has had 2 pregnancies since with c-sections.  Stopped running. Walking down step knee and ankle pop.  Seem to be getting better for a few months then just started getting worse as I ramped up weights  PERTINENT HISTORY: Evaluate and treat for left hip pain and glute weakness. Please include aquatic therapy and land based exercises in treatment plan. PAIN:  Are you having pain? Yes: NPRS scale: current 3/10 Pain location: left glute and anterior hip Pain description: dull ache Aggravating factors: exercises dead lifts; squats  feels unstable Relieving factors: advil  PRECAUTIONS: None  RED FLAGS: None   WEIGHT BEARING RESTRICTIONS: No  FALLS:  Has patient fallen in last 6 months? No  LIVING ENVIRONMENT: Lives with: lives with their family Lives in: House/apartment Stairs: Yes: Internal: 16 steps; on right going up Has following equipment at home: None  OCCUPATION: seated job  PLOF: Independent  PATIENT GOALS: no pain return to run, don't want to hurt myself  further  NEXT MD VISIT: 6-8 weeks/prn  OBJECTIVE:  Note: Objective measures were completed at Evaluation unless otherwise noted.  DIAGNOSTIC FINDINGS: DG Lumbar IMPRESSION: Negative.  PATIENT SURVEYS:  FOTO Primary score 69% with goal of 82% 11/25/23: 59%  COGNITION: Overall cognitive status: Within functional limits for tasks assessed     SENSATION: WFL   MUSCLE LENGTH: Hamstrings: wfl  POSTURE: No Significant postural limitations  PALPATION: TTP left iliopsoas/psoas; left Glut insertion   LOWER EXTREMITY ROM:  [facility]  LOWER EXTREMITY MMT:  MMT  Right eval Left eval Right  11/25/23 Left 11/25/23  Hip flexion 49.1 45.2 49.9 40.1  Hip extension 5/5 4/5    Hip abduction 22.6 23.9 39.9 35.3  Hip adduction      Hip internal rotation  4 no P!    Hip external rotation 5 4 P!    Knee flexion      Knee extension 50.9 53.4 P! Hip flex    Ankle dorsiflexion      Ankle plantarflexion      Ankle inversion      Ankle eversion       (Blank rows = not tested)  LOWER EXTREMITY SPECIAL TESTS:  Hip special tests: Luisa Hart (FABER) test: positive , Trendelenburg test: positive , Thomas test: negative, and SI distraction test: negative  FUNCTIONAL TESTS:  5 times sit to stand: 7.28   GAIT: Unlimited No visual trendelenburg with amb but tests positive with actual test.                                                                                                                                TREATMENT  [facility] Adult PT Treatment:                                                DATE: 12/08/23 Manual Therapy: TPR to L TFL, L iliacus, L piriformis, glute medius STM to L hamstring and ITB Therapeutic Exercise: L hamstring, ITB, and adductor stretch with strap  Prone pelvic press with unilateral knee bends x 5; with unilateral hip ext x 5 each; bilat knee bends x 10 Childs pose / cat cow Kneeling hip hinge with cues for neutral pelvis x 8 (slow) Primal push up with L/R hip ext 2 x 2 (painful in R wrist) Marjo Bicker pose    [facility] Adult PT Treatment:                                                DATE: 12/03/23  Therapeutic Exercise: Prone pelvic press 5 sec hold x 5 -> with unilateral knee bends x 5; with unilateral hip ext x 5 each Marjo Bicker pose / cat  cow L/R hamstring and adductor stretch with strap  SLR on elbows into hip abdct/ addct x 10 each LE Kneeling hip hinge with cues for neutral pelvis x 10 (slow) Childs pose  Primal push up x 20s -> with L hip ext x 2, R hip ext x 1, -> shoulder taps x 2 each shoulder  Manual Therapy: TPR to L lower  lumbar paraspinals STM to L hamstring /  adductors   3/31  STM glute and deep hip rotators with IR/ER  L hip flexor STM through lower abdomen, ischemic pressure  L LE LAD in open pack 3-4 mins  L1-5 CPA and L  UPA grade III  Supine hip flexor march YTB loop at toe box 2x10 3s holds Latreal step down YTB at knees 3x8   MRI results, evidence around imaging, conservative management options, timelines for tendon related pain, imaging report review and anatomical considerations   3/28 Trigger Point Dry Needling  Initial Treatment: Pt instructed on Dry Needling rational, procedures, and possible side effects. Pt instructed to expect mild to moderate muscle soreness later in the day and/or into the next day.  Pt instructed in methods to reduce muscle soreness. Pt instructed to continue prescribed HEP. Because Dry Needling was performed over or adjacent to a lung field, pt was educated on S/S of pneumothorax and to seek immediate medical attention should they occur.  Patient was educated on signs and symptoms of infection and other risk factors and advised to seek medical attention should they occur.  Patient verbalized understanding of these instructions and education.   Patient Verbal Consent Given: Yes Education Handout Provided: Yes Muscles Treated: TFL 2 times using 0.30 X50 needle L4 and L5 left paraspinal  2 times using 0.30 X50 needle Electrical Stimulation Performed: No Treatment Response/Outcome: Great twitch response with each needle   Manual: skilled palpation of trigger points. Trigger point release to lumbar paraspinals and left gluteal and TFL.   There-ex:  Ex bike 5 min L2   Neuro re-ed:   Dead lift 3x10 8 inch  Bridge with a band 2x10    [facility] Adult PT Treatment:                                                DATE: 11/25/23  Therapeutic Exercise: Long sitting -> on elbows- SLR into hip abdct/ addct x 10 LLE, x 5 RLE L adductor stretch L modified Thomas  stretch with R knee towards chest x 30s x 2  Manual Therapy: IASTM to Lt ant/lateral thigh to decrease fascial restrictions TPR to Lt adductors MFR to L iliacus to decrease fascial restrictions and improve mobility  Self Care: Pt educated on self MFR to L iliopsoas with ball to hip in prone   3/19 STM rec fem and glute med on L Mulligan hip mob inf and lateral grade III; LAD  L1-5 CPA and L  UPA grade III Supine HS stretch 15s 3x Wall sit 90 deg 15s 4x Leg swing on box 8" 20x   3/17  Trigger Point Dry Needling  Subsequent Treatment: Instructions provided previously at initial dry needling treatment.   Patient Verbal Consent Given: Yes Education Handout Provided: Previously Provided Muscles Treated: L rec fem Electrical Stimulation Performed: No Treatment Response/Outcome: large LTR elicited  STM rec fem and glute med on L  Couch stretch 30s 3x  Standing fire hydrant RTB at ankles 2x10  7lb RDL 3x8 at wall (hip hinge focus)  Box squat to max tolerable depth 3x8    3/5 Trigger Point Dry Needling  Initial Treatment: Pt instructed on Dry Needling rational, procedures, and possible side effects. Pt instructed to expect mild to moderate muscle soreness later in the day and/or into the next day.  Pt instructed in methods to reduce muscle soreness. Pt instructed to continue prescribed HEP. Because Dry Needling was performed over or adjacent to a lung field, pt was educated on S/S of pneumothorax and to seek immediate medical attention should they occur.  Patient was educated on signs and symptoms of infection and other risk factors and advised to seek medical attention should they occur.  Patient verbalized understanding of these instructions and education.   Patient Verbal Consent Given: Yes Education Handout Provided: Yes Muscles Treated: TFL 2 times using 0.30 X50 needle glute medius 2 times using 0.30 X50 needle Electrical Stimulation Performed: No Treatment  Response/Outcome: Great twitch response with each needle   Manual: skilled palpation of trigger points. Trigger point release to lumbar paraspinals and left gluteal  Nuero re-ed   March with abdominal brace 2x10  Lateral band walk x10 down and back  Bridge with band green 2x12 green. Could feel it in the back  Monster walk 2x10    PATIENT EDUCATION:  Education details:exercise rationale, progressions/ modifications;  Person educated: Patient Education method: Explanation Education comprehension: verbalized understanding  HOME EXERCISE PROGRAM: [id number]  ASSESSMENT:  CLINICAL IMPRESSION: Positive response to hip exercises and manual work over last 2 weeks; less pain in L hip.  Continue palpable tightness and reported tenderness in pt's Lt distal lateral hamstring with STM.  Good tolerance for exercises completed today.  Focus on lumbar strengthening and core stability with neutral pelvis. Recommended pt continue assigned HEP.     Pt reminded that pain below at 4/10 is acceptable during exercise and should not linger post-activity.  Plan to continue with progressive exercise at this time for L lateral hip and anterior hip strength/motor control. Pt would benefit from continued skilled therapy in order to reach goals and maximize functional L LE strength and ROM for full return to PLOF and exercise.  Consider check of knee flexion intolerance and ankle DF ROM at next to assess for hip IR/ADD compensation.    Initial impression Patient is a 38 y.o. f who was seen today for physical therapy evaluation and treatment for Left hip pain. Pt reports old injury x 10 years ago which resolved after cortizone injection.  It has given her issue off and on over the past years but exacerbated in the last 4 months. She is an active exerciser and has 2 small children.  Reports she has continued with her exercise regimen backing off some when left hip/knee and ankle begin to hurt. She has stopped running  altogether. Xray is neg for lumbar spine dysfunction.  She has positive Trendelenburg sign, demonstrates left glut weakness, does not appear to have SI involvement at todays assessment. Left iliopsoas/psoas muscle tightness present with palpation as well as some tenderness. 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  She will benefit from skilled physical therapy to improve left glut/hip complex strength to normalize bio mechanic stability.  Plan to spend some time in aquatics but majority of session land based.  OBJECTIVE  IMPAIRMENTS: decreased activity tolerance, decreased strength, increased muscle spasms, and pain.   ACTIVITY LIMITATIONS: lifting, squatting, and stairs  PARTICIPATION LIMITATIONS:  normal exercise regimen  PERSONAL FACTORS: Fitness and Time since onset of injury/illness/exacerbation are also affecting patient's functional outcome.   REHAB POTENTIAL: Good  CLINICAL DECISION MAKING: Evolving/moderate complexity  EVALUATION COMPLEXITY: Moderate   GOALS: Goals reviewed with patient? Yes  SHORT TERM GOALS: Target date: 10/12/23 Pt will report decreased pain with exercise submerged to <2/10 and in positions land based which cause pain Baseline:squats/hip flex, descending steps; 6/10 Goal status: NOT MET 10/23/23  2.  Pt will demonstrate indep with aquatic exercises and will make decision on best setting for exercise going forward.  Aquatic HEP to be assigned as approp. Baseline:  Goal status: met 10/23/23   LONG TERM GOALS: Target date: 01/22/24  Pt to meet stated Foto Goal of 82% Baseline: 69%; see above  Goal status: In progress  2.  Pt will tolerate running short distance (under 1 mile) Baseline:  Goal status: Not met - 11/25/23  3.  Pt will descend 1 flight of steps without pain Baseline: painful, but NO increase in pain Goal status: Partially met -  11/25/23  4.  Pt will improve strength in Left hip = to contralateral side to demonstrate improved biomechanical stability of hip Baseline: see above Goal status: In progress - 11/25/23  5.  Pt will return to indep regimen of exercises without limitation due to weakness or pain Baseline: limited;  modifying gym routine Goal status: In progress - 11/25/23  6.  Pt will amb without trendelenburg left hip Baseline: positive tendelenburg Goal status: In progress -11/25/23   PLAN:  PT FREQUENCY: 2x/week  PT DURATION: 8 weeks  PLANNED INTERVENTIONS: 97164- PT Re-evaluation, 97110-Therapeutic exercises, 97530- Therapeutic activity, 97112- Neuromuscular re-education, 97535- Self Care, 40102- Manual therapy, [phone]- Gait training, [phone]- Orthotic Fit/training, [phone]- Aquatic Therapy, [phone]- Electrical stimulation (unattended), [phone]- Ionotophoresis 4mg /ml Dexamethasone, Patient/Family education, Balance training, Stair training, Taping, Dry Needling, Joint mobilization, DME instructions, Cryotherapy, and Moist heat  PLAN FOR NEXT SESSION: hip strengthening special attention to glutes, knee strengthening, gait training, land based consider DN; stretching program special attention to hip flex, pain control.  Mayer Camel, PTA 12/08/23 8:52 AM [facility] [address] [place], [place], [address] Phone: [phone]   Fax:  [phone]

## 2023-12-09 DIAGNOSIS — M25552 Pain in left hip: Secondary | ICD-10-CM | POA: Diagnosis not present

## 2023-12-09 DIAGNOSIS — M7918 Myalgia, other site: Secondary | ICD-10-CM | POA: Diagnosis not present

## 2023-12-10 ENCOUNTER — Encounter: Payer: Self-pay | Admitting: Family Medicine

## 2023-12-11 ENCOUNTER — Ambulatory Visit (HOSPITAL_BASED_OUTPATIENT_CLINIC_OR_DEPARTMENT_OTHER): Payer: Self-pay | Admitting: Physical Therapy

## 2023-12-11 DIAGNOSIS — M25552 Pain in left hip: Secondary | ICD-10-CM

## 2023-12-11 DIAGNOSIS — R2689 Other abnormalities of gait and mobility: Secondary | ICD-10-CM

## 2023-12-11 DIAGNOSIS — R29898 Other symptoms and signs involving the musculoskeletal system: Secondary | ICD-10-CM

## 2023-12-11 NOTE — Therapy (Signed)
 OUTPATIENT PHYSICAL THERAPY LOWER EXTREMITY TREATMENT   Patient Name: Jasmine Conrad MRN: [id number] DOB:[birth year], 38 y.o., female Today's Date: 12/11/2023  END OF SESSION:  PT End of Session - 12/11/23 0833     Visit Number 18    Number of Visits 28    Date for PT Re-Evaluation 01/22/24    Authorization Type [employer]    PT Start Time 0800    PT Stop Time 0843    PT Time Calculation (min) 43 min    Activity Tolerance Patient tolerated treatment well    Behavior During Therapy [facility] for tasks assessed/performed                Past Medical History:  Diagnosis Date   Depression    Fibroid    Medical history non-contributory    Past Surgical History:  Procedure Laterality Date   CESAREAN SECTION N/A 09/10/2017   Procedure: CESAREAN SECTION;  Surgeon: Mitchel Honour, DO;  Location: [facility];  Service: Obstetrics;  Laterality: N/A;  Primary edc 09/16/17 NKDA [name] RNFA   CESAREAN SECTION N/A 08/29/2019   Procedure: CESAREAN SECTION;  Surgeon: Mitchel Honour, DO;  Location: [facility];  Service: Obstetrics;  Laterality: N/A;  REPEAT EDC 09/08/19 NKDA [name],  RNFA   WISDOM TOOTH EXTRACTION     Patient Active Problem List   Diagnosis Date Noted   Previous cesarean section 08/29/2019   S/P cesarean section 09/10/2017   Trauma during pregnancy 07/03/2017   Left shoulder pain 04/01/2016    PCP: Maryelizabeth Rowan MD  REFERRING PROVIDER: Andi Devon, DO   REFERRING DIAG: [phone] (ICD-10-CM) - Left hip pain   THERAPY DIAG:  Left hip pain  Weakness of left hip  Other abnormalities of gait and mobility  Rationale for Evaluation and Treatment: Rehabilitation  ONSET DATE: exacerbated last 4-5 months  SUBJECTIVE:   SUBJECTIVE STATEMENT: The patient reports she had a few good days but is having some anterior hip pain today into her lateral hip.   Initial subjective 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 Have been doing ok until last few months  having trouble completing anything strenuous throughout left le. Pain started in my hip and now it is my left knee and ankle. Had a cortizone shot 10 years ago in hip due to an injury which made it better at the time. Has had 2 pregnancies since with c-sections.  Stopped running. Walking down step knee and ankle pop.  Seem to be getting better for a few months then just started getting worse as I ramped up weights  PERTINENT HISTORY: Evaluate and treat for left hip pain and glute weakness. Please include aquatic therapy and land based exercises in treatment plan. PAIN:  Are you having pain? Yes: NPRS scale: current 3/10 Pain location: left glute and anterior hip Pain description: dull ache Aggravating factors: exercises dead lifts; squats  feels unstable Relieving factors: advil  PRECAUTIONS: None  RED FLAGS: None   WEIGHT BEARING RESTRICTIONS: No  FALLS:  Has patient fallen in last 6 months? No  LIVING ENVIRONMENT: Lives with: lives with their family Lives in: House/apartment Stairs: Yes: Internal: 16 steps; on right going up Has following equipment at home: None  OCCUPATION: seated job  PLOF: Independent  PATIENT GOALS: no pain return to run, don't want to hurt myself further  NEXT MD VISIT: 6-8 weeks/prn  OBJECTIVE:  Note: Objective measures were completed at Evaluation unless otherwise noted.  DIAGNOSTIC FINDINGS: DG Lumbar IMPRESSION: Negative.  PATIENT SURVEYS:  FOTO Primary score 69% with goal of 82% 11/25/23: 59%  COGNITION: Overall cognitive status: Within functional limits for tasks assessed     SENSATION: WFL   MUSCLE LENGTH: Hamstrings: wfl  POSTURE: No Significant postural limitations  PALPATION: TTP left iliopsoas/psoas; left Glut insertion   LOWER EXTREMITY ROM:  [facility]  LOWER EXTREMITY MMT:  MMT Right eval Left eval Right  11/25/23 Left 11/25/23  Hip flexion 49.1 45.2 49.9 40.1  Hip extension 5/5 4/5    Hip abduction 22.6 23.9 39.9  35.3  Hip adduction      Hip internal rotation  4 no P!    Hip external rotation 5 4 P!    Knee flexion      Knee extension 50.9 53.4 P! Hip flex    Ankle dorsiflexion      Ankle plantarflexion      Ankle inversion      Ankle eversion       (Blank rows = not tested)  LOWER EXTREMITY SPECIAL TESTS:  Hip special tests: Luisa Hart (FABER) test: positive , Trendelenburg test: positive , Thomas test: negative, and SI distraction test: negative  FUNCTIONAL TESTS:  5 times sit to stand: 7.28   GAIT: Unlimited No visual trendelenburg with amb but tests positive with actual test.                                                                                                                                TREATMENT    4/11 Trigger Point Dry Needling  Initial Treatment: Pt instructed on Dry Needling rational, procedures, and possible side effects. Pt instructed to expect mild to moderate muscle soreness later in the day and/or into the next day.  Pt instructed in methods to reduce muscle soreness. Pt instructed to continue prescribed HEP. Because Dry Needling was performed over or adjacent to a lung field, pt was educated on S/S of pneumothorax and to seek immediate medical attention should they occur.  Patient was educated on signs and symptoms of infection and other risk factors and advised to seek medical attention should they occur.  Patient verbalized understanding of these instructions and education.   Patient Verbal Consent Given: Yes Education Handout Provided: Yes Muscles Treated: TFL 2 times using 0.30 X50 needle L4 and L5 left paraspinal  2 times using 0.30 X50 needle Electrical Stimulation Performed: No Treatment Response/Outcome: Great twitch response with each needle  Neuro-reed:  90/90 ball  Press and hold 2x15  DKTC 2x15   Bridge with band 2x10   [facility] Adult PT Treatment:                                                DATE: 12/08/23 Manual Therapy: TPR to L TFL, L  iliacus, L piriformis, glute medius STM to L  hamstring and ITB Therapeutic Exercise: L hamstring, ITB, and adductor stretch with strap  Prone pelvic press with unilateral knee bends x 5; with unilateral hip ext x 5 each; bilat knee bends x 10 Childs pose / cat cow Kneeling hip hinge with cues for neutral pelvis x 8 (slow) Primal push up with L/R hip ext 2 x 2 (painful in R wrist) Marjo Bicker pose    [facility] Adult PT Treatment:                                                DATE: 12/03/23  Therapeutic Exercise: Prone pelvic press 5 sec hold x 5 -> with unilateral knee bends x 5; with unilateral hip ext x 5 each Childs pose / cat cow L/R hamstring and adductor stretch with strap  SLR on elbows into hip abdct/ addct x 10 each LE Kneeling hip hinge with cues for neutral pelvis x 10 (slow) Childs pose  Primal push up x 20s -> with L hip ext x 2, R hip ext x 1, -> shoulder taps x 2 each shoulder  Manual Therapy: TPR to L lower lumbar paraspinals STM to L hamstring /  adductors   3/31  STM glute and deep hip rotators with IR/ER  L hip flexor STM through lower abdomen, ischemic pressure  L LE LAD in open pack 3-4 mins  L1-5 CPA and L  UPA grade III  Supine hip flexor march YTB loop at toe box 2x10 3s holds Latreal step down YTB at knees 3x8   MRI results, evidence around imaging, conservative management options, timelines for tendon related pain, imaging report review and anatomical considerations   3/28 Trigger Point Dry Needling  Initial Treatment: Pt instructed on Dry Needling rational, procedures, and possible side effects. Pt instructed to expect mild to moderate muscle soreness later in the day and/or into the next day.  Pt instructed in methods to reduce muscle soreness. Pt instructed to continue prescribed HEP. Because Dry Needling was performed over or adjacent to a lung field, pt was educated on S/S of pneumothorax and to seek immediate medical attention should they occur.   Patient was educated on signs and symptoms of infection and other risk factors and advised to seek medical attention should they occur.  Patient verbalized understanding of these instructions and education.   Patient Verbal Consent Given: Yes Education Handout Provided: Yes Muscles Treated: TFL 2 times using 0.30 X50 needle L4 and L5 left paraspinal  2 times using 0.30 X50 needle Electrical Stimulation Performed: No Treatment Response/Outcome: Great twitch response with each needle   Manual: skilled palpation of trigger points. Trigger point release to lumbar paraspinals and left gluteal and TFL.   There-ex:  Ex bike 5 min L2   Neuro re-ed:   Dead lift 3x10 8 inch  Bridge with a band 2x10     PATIENT EDUCATION:  Education details:exercise rationale, progressions/ modifications;  Person educated: Patient Education method: Explanation Education comprehension: verbalized understanding  HOME EXERCISE PROGRAM: [id number]  ASSESSMENT:  CLINICAL IMPRESSION: The patient continues to have anterior tightness in her hip. We needled it today. And she had a significant improvement. We also needled her hamstring today. We reviewed base exercises. She has too more visits scheduled. She will see the MD upstairs on 4/30.      Pt reminded that pain below at 4/10  is acceptable during exercise and should not linger post-activity.  Plan to continue with progressive exercise at this time for L lateral hip and anterior hip strength/motor control. Pt would benefit from continued skilled therapy in order to reach goals and maximize functional L LE strength and ROM for full return to PLOF and exercise.  Consider check of knee flexion intolerance and ankle DF ROM at next to assess for hip IR/ADD compensation.    Initial impression Patient is a 38 y.o. f who was seen today for physical therapy evaluation and treatment for Left hip pain. Pt reports old injury x 10 years ago which resolved after  cortizone injection.  It has given her issue off and on over the past years but exacerbated in the last 4 months. She is an active exerciser and has 2 small children.  Reports she has continued with her exercise regimen backing off some when left hip/knee and ankle begin to hurt. She has stopped running altogether. Xray is neg for lumbar spine dysfunction.  She has positive Trendelenburg sign, demonstrates left glut weakness, does not appear to have SI involvement at todays assessment. Left iliopsoas/psoas muscle tightness present with palpation as well as some tenderness. 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  She will benefit from skilled physical therapy to improve left glut/hip complex strength to normalize bio mechanic stability.  Plan to spend some time in aquatics but majority of session land based.  OBJECTIVE IMPAIRMENTS: decreased activity tolerance, decreased strength, increased muscle spasms, and pain.   ACTIVITY LIMITATIONS: lifting, squatting, and stairs  PARTICIPATION LIMITATIONS:  normal exercise regimen  PERSONAL FACTORS: Fitness and Time since onset of injury/illness/exacerbation are also affecting patient's functional outcome.   REHAB POTENTIAL: Good  CLINICAL DECISION MAKING: Evolving/moderate complexity  EVALUATION COMPLEXITY: Moderate   GOALS: Goals reviewed with patient? Yes  SHORT TERM GOALS: Target date: 10/12/23 Pt will report decreased pain with exercise submerged to <2/10 and in positions land based which cause pain Baseline:squats/hip flex, descending steps; 6/10 Goal status: NOT MET 10/23/23  2.  Pt will demonstrate indep with aquatic exercises and will make decision on best setting for exercise going forward.  Aquatic HEP to be assigned as approp. Baseline:  Goal status: met 10/23/23   LONG TERM GOALS: Target date: 01/22/24  Pt to meet stated  Foto Goal of 82% Baseline: 69%; see above  Goal status: In progress  2.  Pt will tolerate running short distance (under 1 mile) Baseline:  Goal status: Not met - 11/25/23  3.  Pt will descend 1 flight of steps without pain Baseline: painful, but NO increase in pain Goal status: Partially met - 11/25/23  4.  Pt will improve strength in Left hip = to contralateral side to demonstrate improved biomechanical stability of hip Baseline: see above Goal status: In progress - 11/25/23  5.  Pt will return to indep regimen of exercises without limitation due to weakness or pain Baseline: limited;  modifying gym routine Goal status: In progress - 11/25/23  6.  Pt will amb without trendelenburg left hip Baseline: positive tendelenburg Goal status: In progress -11/25/23   PLAN:  PT FREQUENCY: 2x/week  PT DURATION: 8 weeks  PLANNED INTERVENTIONS: 97164- PT Re-evaluation, 97110-Therapeutic exercises, 97530- Therapeutic activity, 97112- Neuromuscular re-education, 97535- Self Care, 78295- Manual therapy, [id number]- Gait training, [phone]- Orthotic Fit/training, [id number]- Aquatic Therapy, 97014- Electrical stimulation (unattended),  14782- Ionotophoresis 4mg /ml Dexamethasone, Patient/Family education, Balance training, Stair training, Taping, Dry Needling, Joint mobilization, DME instructions, Cryotherapy, and Moist heat  PLAN FOR NEXT SESSION: hip strengthening special attention to glutes, knee strengthening, gait training, land based consider DN; stretching program special attention to hip flex, pain control.  Mayer Camel, PTA 12/11/23 10:48 AM [facility] [address] [place], [place], [address] Phone: [phone]   Fax:  [phone]

## 2023-12-12 LAB — CBC WITH DIFFERENTIAL/PLATELET
Basophils Absolute: 0.1 10*3/uL (ref 0.0–0.2)
Basos: 1 %
EOS (ABSOLUTE): 0.1 10*3/uL (ref 0.0–0.4)
Eos: 1 %
Hematocrit: 39 % (ref 34.0–46.6)
Hemoglobin: 12.6 g/dL (ref 11.1–15.9)
Immature Grans (Abs): 0 10*3/uL (ref 0.0–0.1)
Immature Granulocytes: 0 %
Lymphocytes Absolute: 3.1 10*3/uL (ref 0.7–3.1)
Lymphs: 36 %
MCH: 26.5 pg — ABNORMAL LOW (ref 26.6–33.0)
MCHC: 32.3 g/dL (ref 31.5–35.7)
MCV: 82 fL (ref 79–97)
Monocytes Absolute: 0.5 10*3/uL (ref 0.1–0.9)
Monocytes: 6 %
Neutrophils Absolute: 4.9 10*3/uL (ref 1.4–7.0)
Neutrophils: 56 %
Platelets: 314 10*3/uL (ref 150–450)
RBC: 4.75 x10E6/uL (ref 3.77–5.28)
RDW: 13.2 % (ref 11.7–15.4)
WBC: 8.6 10*3/uL (ref 3.4–10.8)

## 2023-12-12 LAB — COMPREHENSIVE METABOLIC PANEL WITH GFR
ALT: 12 IU/L (ref 0–32)
AST: 17 IU/L (ref 0–40)
Albumin: 4.8 g/dL (ref 3.9–4.9)
Alkaline Phosphatase: 49 IU/L (ref 44–121)
BUN/Creatinine Ratio: 23 (ref 9–23)
BUN: 17 mg/dL (ref 6–20)
Bilirubin Total: <0.2 mg/dL (ref 0.0–1.2)
CO2: 26 mmol/L (ref 20–29)
Calcium: 9.7 mg/dL (ref 8.7–10.2)
Chloride: 100 mmol/L (ref 96–106)
Creatinine, Ser: 0.74 mg/dL (ref 0.57–1.00)
Globulin, Total: 2.1 g/dL (ref 1.5–4.5)
Glucose: 86 mg/dL (ref 70–99)
Potassium: 3.6 mmol/L (ref 3.5–5.2)
Sodium: 142 mmol/L (ref 134–144)
Total Protein: 6.9 g/dL (ref 6.0–8.5)
eGFR: 106 mL/min/{1.73_m2} (ref >59)

## 2023-12-12 LAB — C-REACTIVE PROTEIN: CRP: <1 mg/L (ref 0–10)

## 2023-12-12 LAB — RHEUMATOID FACTOR: Rheumatoid fact SerPl-aCnc: <10 [IU]/mL (ref <14.0)

## 2023-12-12 LAB — CYCLIC CITRUL PEPTIDE ANTIBODY, IGG/IGA: Cyclic Citrullin Peptide Ab: 6 U (ref 0–19)

## 2023-12-12 LAB — ANTINUCLEAR ANTIBODIES, IFA: ANA Titer 1: NEGATIVE

## 2023-12-12 LAB — SEDIMENTATION RATE: Sed Rate: 2 mm/h (ref 0–32)

## 2023-12-14 ENCOUNTER — Encounter (HOSPITAL_BASED_OUTPATIENT_CLINIC_OR_DEPARTMENT_OTHER): Payer: Self-pay | Admitting: Physical Therapy

## 2023-12-14 ENCOUNTER — Ambulatory Visit (HOSPITAL_BASED_OUTPATIENT_CLINIC_OR_DEPARTMENT_OTHER): Admitting: Physical Therapy

## 2023-12-14 DIAGNOSIS — R29898 Other symptoms and signs involving the musculoskeletal system: Secondary | ICD-10-CM | POA: Diagnosis not present

## 2023-12-14 DIAGNOSIS — M25552 Pain in left hip: Secondary | ICD-10-CM | POA: Diagnosis not present

## 2023-12-14 DIAGNOSIS — R2689 Other abnormalities of gait and mobility: Secondary | ICD-10-CM

## 2023-12-14 NOTE — Progress Notes (Signed)
 All labs negative.  MyChart message sent.  PT to have Dr Hermina Loosen with [facility] review MRI of the hip.  Consider u/s guided diagnostic hip injection.  MyChart message sent.

## 2023-12-14 NOTE — Therapy (Signed)
 OUTPATIENT PHYSICAL THERAPY LOWER EXTREMITY TREATMENT   Patient Name: Jasmine Conrad MRN: [id number] DOB:1986/07/17, 38 y.o., female Today's Date: 12/14/2023  END OF SESSION:  PT End of Session - 12/14/23 1059     Visit Number 19    Number of Visits 28    Date for PT Re-Evaluation 01/22/24    Authorization Type [employer]    PT Start Time 1015    PT Stop Time 1056    PT Time Calculation (min) 41 min    Activity Tolerance Patient tolerated treatment well    Behavior During Therapy WFL for tasks assessed/performed                 Past Medical History:  Diagnosis Date   Depression    Fibroid    Medical history non-contributory    Past Surgical History:  Procedure Laterality Date   CESAREAN SECTION N/A 09/10/2017   Procedure: CESAREAN SECTION;  Surgeon: Dyanna Glasgow, DO;  Location: [facility];  Service: Obstetrics;  Laterality: N/A;  Primary edc 09/16/17 NKDA [name] RNFA   CESAREAN SECTION N/A 08/29/2019   Procedure: CESAREAN SECTION;  Surgeon: Dyanna Glasgow, DO;  Location: [facility];  Service: Obstetrics;  Laterality: N/A;  REPEAT EDC 09/08/19 NKDA [name],  RNFA   WISDOM TOOTH EXTRACTION     Patient Active Problem List   Diagnosis Date Noted   Previous cesarean section 08/29/2019   S/P cesarean section 09/10/2017   Trauma during pregnancy 07/03/2017   Left shoulder pain 04/01/2016    PCP: Rayma Calandra MD  REFERRING PROVIDER: Rodgers Clack, DO   REFERRING DIAG: [phone] (ICD-10-CM) - Left hip pain   THERAPY DIAG:  Left hip pain  Weakness of left hip  Other abnormalities of gait and mobility  Rationale for Evaluation and Treatment: Rehabilitation  ONSET DATE: exacerbated last 4-5 months  SUBJECTIVE:   SUBJECTIVE STATEMENT: Pt reports still have pain into the front of the hip. Pt is still modifying workouts, especially deadlifts. Pt feels squatting is more comfortable.   Initial subjective 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  Have been doing ok until last few months having trouble completing anything strenuous throughout left le. Pain started in my hip and now it is my left knee and ankle. Had a cortizone shot 10 years ago in hip due to an injury which made it better at the time. Has had 2 pregnancies since with c-sections.  Stopped running. Walking down step knee and ankle pop.  Seem to be getting better for a few months then just started getting worse as I ramped up weights  PERTINENT HISTORY: Evaluate and treat for left hip pain and glute weakness. Please include aquatic therapy and land based exercises in treatment plan. PAIN:  Are you having pain? Yes: NPRS scale: current 3/10 Pain location: left glute and anterior hip Pain description: dull ache Aggravating factors: exercises dead lifts; squats  feels unstable Relieving factors: advil  PRECAUTIONS: None  RED FLAGS: None   WEIGHT BEARING RESTRICTIONS: No  FALLS:  Has patient fallen in last 6 months? No  LIVING ENVIRONMENT: Lives with: lives with their family Lives in: House/apartment Stairs: Yes: Internal: 16 steps; on right going up Has following equipment at home: None  OCCUPATION: seated job  PLOF: Independent  PATIENT GOALS: no pain return to run, don't want to hurt myself further  NEXT MD VISIT: 6-8 weeks/prn  OBJECTIVE:  Note: Objective measures were completed at Evaluation unless otherwise noted.  DIAGNOSTIC FINDINGS: DG  Lumbar IMPRESSION: Negative.  PATIENT SURVEYS:  FOTO Primary score 69% with goal of 82% 11/25/23: 59%  COGNITION: Overall cognitive status: Within functional limits for tasks assessed     SENSATION: WFL   MUSCLE LENGTH: Hamstrings: wfl  POSTURE: No Significant postural limitations  PALPATION: TTP left iliopsoas/psoas; left Glut insertion   LOWER EXTREMITY ROM:  [facility]  LOWER EXTREMITY MMT:  MMT Right eval Left eval Right  11/25/23 Left 11/25/23  Hip flexion 49.1 45.2 49.9 40.1  Hip extension  5/5 4/5    Hip abduction 22.6 23.9 39.9 35.3  Hip adduction      Hip internal rotation  4 no P!    Hip external rotation 5 4 P!    Knee flexion      Knee extension 50.9 53.4 P! Hip flex    Ankle dorsiflexion      Ankle plantarflexion      Ankle inversion      Ankle eversion       (Blank rows = not tested)  LOWER EXTREMITY SPECIAL TESTS:  Hip special tests: Luisa Hart (FABER) test: positive , Trendelenburg test: positive , Thomas test: negative, and SI distraction test: negative  FUNCTIONAL TESTS:  5 times sit to stand: 7.28   GAIT: Unlimited No visual trendelenburg with amb but tests positive with actual test.                                                                                                                                TREATMENT   4/14  TTP with cross friction across common proximal quad and TFL tendon  STM L TFL and deep hip rotators with IR/ER L LE LAD in open pack 3-4 mins: mullgian lateral mob grade III  Standing hip flexor march YTB loop at toe box 2x10 3s Kneeling hip hinge 10x without; 2x10 with blue band resistance YTB side plank 2x10 2s hold   4/11 Trigger Point Dry Needling  Initial Treatment: Pt instructed on Dry Needling rational, procedures, and possible side effects. Pt instructed to expect mild to moderate muscle soreness later in the day and/or into the next day.  Pt instructed in methods to reduce muscle soreness. Pt instructed to continue prescribed HEP. Because Dry Needling was performed over or adjacent to a lung field, pt was educated on S/S of pneumothorax and to seek immediate medical attention should they occur.  Patient was educated on signs and symptoms of infection and other risk factors and advised to seek medical attention should they occur.  Patient verbalized understanding of these instructions and education.   Patient Verbal Consent Given: Yes Education Handout Provided: Yes Muscles Treated: TFL 2 times using 0.30 X50  needle L4 and L5 left paraspinal  2 times using 0.30 X50 needle Electrical Stimulation Performed: No Treatment Response/Outcome: Great twitch response with each needle  Neuro-reed:  90/90 ball  Press and hold 2x15  DKTC 2x15   Bridge with band 2x10  [facility] Adult PT Treatment:                                                DATE: 12/08/23 Manual Therapy: TPR to L TFL, L iliacus, L piriformis, glute medius STM to L hamstring and ITB Therapeutic Exercise: L hamstring, ITB, and adductor stretch with strap  Prone pelvic press with unilateral knee bends x 5; with unilateral hip ext x 5 each; bilat knee bends x 10 Childs pose / cat cow Kneeling hip hinge with cues for neutral pelvis x 8 (slow) Primal push up with L/R hip ext 2 x 2 (painful in R wrist) Earline Glenn pose    [facility] Adult PT Treatment:                                                DATE: 12/03/23  Therapeutic Exercise: Prone pelvic press 5 sec hold x 5 -> with unilateral knee bends x 5; with unilateral hip ext x 5 each Childs pose / cat cow L/R hamstring and adductor stretch with strap  SLR on elbows into hip abdct/ addct x 10 each LE Kneeling hip hinge with cues for neutral pelvis x 10 (slow) Childs pose  Primal push up x 20s -> with L hip ext x 2, R hip ext x 1, -> shoulder taps x 2 each shoulder  Manual Therapy: TPR to L lower lumbar paraspinals STM to L hamstring /  adductors   3/31  STM glute and deep hip rotators with IR/ER  L hip flexor STM through lower abdomen, ischemic pressure  L LE LAD in open pack 3-4 mins  L1-5 CPA and L  UPA grade III  Supine hip flexor march YTB loop at toe box 2x10 3s holds Latreal step down YTB at knees 3x8   MRI results, evidence around imaging, conservative management options, timelines for tendon related pain, imaging report review and anatomical considerations   3/28 Trigger Point Dry Needling  Initial Treatment: Pt instructed on Dry Needling rational, procedures, and possible side  effects. Pt instructed to expect mild to moderate muscle soreness later in the day and/or into the next day.  Pt instructed in methods to reduce muscle soreness. Pt instructed to continue prescribed HEP. Because Dry Needling was performed over or adjacent to a lung field, pt was educated on S/S of pneumothorax and to seek immediate medical attention should they occur.  Patient was educated on signs and symptoms of infection and other risk factors and advised to seek medical attention should they occur.  Patient verbalized understanding of these instructions and education.   Patient Verbal Consent Given: Yes Education Handout Provided: Yes Muscles Treated: TFL 2 times using 0.30 X50 needle L4 and L5 left paraspinal  2 times using 0.30 X50 needle Electrical Stimulation Performed: No Treatment Response/Outcome: Great twitch response with each needle   Manual: skilled palpation of trigger points. Trigger point release to lumbar paraspinals and left gluteal and TFL.   There-ex:  Ex bike 5 min L2   Neuro re-ed:   Dead lift 3x10 8 inch  Bridge with a band 2x10     PATIENT EDUCATION:  Education details:exercise rationale, progressions/ modifications;  Person educated: Patient Education method: Explanation Education comprehension:  verbalized understanding  HOME EXERCISE PROGRAM: [id number]  ASSESSMENT:  CLINICAL IMPRESSION: Patient with continued baseline pain at the anterior lateral hip that is 3 out of 10.  Patient was able to progress functional hip and glued strengthening today with no increase in pain.  Patient does continue to respond well to [facility] mobilizations for relief of discomfort.  Plan for patient to have follow-up PT visit and then have physician visit on April 30.  Plan to continue with progressive strengthening strengthening for home exercise program as tolerated.  Patient was advised to hold off on hip hinge in standing at this time as it still continues to recreate  pain and modified to kneeling or elevated supine positions at this time.  Pt would benefit from continued skilled therapy in order to reach goals and maximize functional L LE strength and ROM for full return to PLOF and exercise.    Initial impression Patient is a 38 y.o. f who was seen today for physical therapy evaluation and treatment for Left hip pain. Pt reports old injury x 10 years ago which resolved after cortizone injection.  It has given her issue off and on over the past years but exacerbated in the last 4 months. She is an active exerciser and has 2 small children.  Reports she has continued with her exercise regimen backing off some when left hip/knee and ankle begin to hurt. She has stopped running altogether. Xray is neg for lumbar spine dysfunction.  She has positive Trendelenburg sign, demonstrates left glut weakness, does not appear to have SI involvement at todays assessment. Left iliopsoas/psoas muscle tightness present with palpation as well as some tenderness. 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  She will benefit from skilled physical therapy to improve left glut/hip complex strength to normalize bio mechanic stability.  Plan to spend some time in aquatics but majority of session land based.  OBJECTIVE IMPAIRMENTS: decreased activity tolerance, decreased strength, increased muscle spasms, and pain.   ACTIVITY LIMITATIONS: lifting, squatting, and stairs  PARTICIPATION LIMITATIONS:  normal exercise regimen  PERSONAL FACTORS: Fitness and Time since onset of injury/illness/exacerbation are also affecting patient's functional outcome.   REHAB POTENTIAL: Good  CLINICAL DECISION MAKING: Evolving/moderate complexity  EVALUATION COMPLEXITY: Moderate   GOALS: Goals reviewed with patient? Yes  SHORT TERM GOALS: Target date: 10/12/23 Pt will report decreased pain  with exercise submerged to <2/10 and in positions land based which cause pain Baseline:squats/hip flex, descending steps; 6/10 Goal status: NOT MET 10/23/23  2.  Pt will demonstrate indep with aquatic exercises and will make decision on best setting for exercise going forward.  Aquatic HEP to be assigned as approp. Baseline:  Goal status: met 10/23/23   LONG TERM GOALS: Target date: 01/22/24  Pt to meet stated Foto Goal of 82% Baseline: 69%; see above  Goal status: In progress  2.  Pt will tolerate running short distance (under 1 mile) Baseline:  Goal status: Not met - 11/25/23  3.  Pt will descend 1 flight of steps without pain Baseline: painful, but NO increase in pain Goal status: Partially met - 11/25/23  4.  Pt will improve strength in Left hip = to contralateral side to demonstrate improved biomechanical stability of hip Baseline: see above Goal status: In progress - 11/25/23  5.  Pt will return to indep regimen of exercises without limitation due to weakness  or pain Baseline: limited;  modifying gym routine Goal status: In progress - 11/25/23  6.  Pt will amb without trendelenburg left hip Baseline: positive tendelenburg Goal status: In progress -11/25/23   PLAN:  PT FREQUENCY: 2x/week  PT DURATION: 8 weeks  PLANNED INTERVENTIONS: 97164- PT Re-evaluation, 97110-Therapeutic exercises, 97530- Therapeutic activity, 97112- Neuromuscular re-education, 97535- Self Care, 16109- Manual therapy, [phone]- Gait training, [phone]- Orthotic Fit/training, [phone]- Aquatic Therapy, [phone]- Electrical stimulation (unattended), [phone]- Ionotophoresis 4mg /ml Dexamethasone, Patient/Family education, Balance training, Stair training, Taping, Dry Needling, Joint mobilization, DME instructions, Cryotherapy, and Moist heat  PLAN FOR NEXT SESSION: hip strengthening special attention to glutes, knee strengthening, gait training, land based consider DN; stretching program special attention to hip flex, pain  control.  Almedia Jacobsen, PTA 12/14/23 11:02 AM [facility] [address] [place], [place], [address] Phone: [phone]   Fax:  [phone]

## 2023-12-16 ENCOUNTER — Encounter (HOSPITAL_BASED_OUTPATIENT_CLINIC_OR_DEPARTMENT_OTHER): Payer: Self-pay | Admitting: Physical Therapy

## 2023-12-16 ENCOUNTER — Ambulatory Visit (HOSPITAL_BASED_OUTPATIENT_CLINIC_OR_DEPARTMENT_OTHER): Admitting: Physical Therapy

## 2023-12-16 DIAGNOSIS — R2689 Other abnormalities of gait and mobility: Secondary | ICD-10-CM | POA: Diagnosis not present

## 2023-12-16 DIAGNOSIS — M25552 Pain in left hip: Secondary | ICD-10-CM

## 2023-12-16 DIAGNOSIS — R29898 Other symptoms and signs involving the musculoskeletal system: Secondary | ICD-10-CM | POA: Diagnosis not present

## 2023-12-16 NOTE — Therapy (Signed)
 OUTPATIENT PHYSICAL THERAPY LOWER EXTREMITY TREATMENT   Patient Name: Jasmine Conrad MRN: [id number] DOB:09/03/1985, 38 y.o., female Today's Date: 12/16/2023  END OF SESSION:  PT End of Session - 12/16/23 0928     Visit Number 20    Number of Visits 28    Date for PT Re-Evaluation 01/22/24    Authorization Type [employer]    PT Start Time 0802    PT Stop Time 0843    PT Time Calculation (min) 41 min    Activity Tolerance Patient tolerated treatment well;No increased pain    Behavior During Therapy WFL for tasks assessed/performed                  Past Medical History:  Diagnosis Date   Depression    Fibroid    Medical history non-contributory    Past Surgical History:  Procedure Laterality Date   CESAREAN SECTION N/A 09/10/2017   Procedure: CESAREAN SECTION;  Surgeon: Dyanna Glasgow, DO;  Location: [facility];  Service: Obstetrics;  Laterality: N/A;  Primary edc 09/16/17 NKDA [name] RNFA   CESAREAN SECTION N/A 08/29/2019   Procedure: CESAREAN SECTION;  Surgeon: Dyanna Glasgow, DO;  Location: [facility];  Service: Obstetrics;  Laterality: N/A;  REPEAT EDC 09/08/19 NKDA [name],  RNFA   WISDOM TOOTH EXTRACTION     Patient Active Problem List   Diagnosis Date Noted   Previous cesarean section 08/29/2019   S/P cesarean section 09/10/2017   Trauma during pregnancy 07/03/2017   Left shoulder pain 04/01/2016    PCP: Rayma Calandra MD  REFERRING PROVIDER: Rodgers Clack, DO   REFERRING DIAG: [phone] (ICD-10-CM) - Left hip pain   THERAPY DIAG:  No diagnosis found.  Rationale for Evaluation and Treatment: Rehabilitation  ONSET DATE: exacerbated last 4-5 months  SUBJECTIVE:   SUBJECTIVE STATEMENT: 4/16 Pt reports pain is still present in front of hip. Had some difficulty with side plank clamshells last visit.   Initial subjective 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 Have been doing ok until last few months having trouble completing anything  strenuous throughout left le. Pain started in my hip and now it is my left knee and ankle. Had a cortizone shot 10 years ago in hip due to an injury which made it better at the time. Has had 2 pregnancies since with c-sections.  Stopped running. Walking down step knee and ankle pop.  Seem to be getting better for a few months then just started getting worse as I ramped up weights  PERTINENT HISTORY: Evaluate and treat for left hip pain and glute weakness. Please include aquatic therapy and land based exercises in treatment plan. PAIN:  Are you having pain? Yes: NPRS scale: current 3/10 Pain location: left glute and anterior hip Pain description: dull ache Aggravating factors: exercises dead lifts; squats  feels unstable Relieving factors: advil  PRECAUTIONS: None  RED FLAGS: None   WEIGHT BEARING RESTRICTIONS: No  FALLS:  Has patient fallen in last 6 months? No  LIVING ENVIRONMENT: Lives with: lives with their family Lives in: House/apartment Stairs: Yes: Internal: 16 steps; on right going up Has following equipment at home: None  OCCUPATION: seated job  PLOF: Independent  PATIENT GOALS: no pain return to run, don't want to hurt myself further  NEXT MD VISIT: 6-8 weeks/prn  OBJECTIVE:  Note: Objective measures were completed at Evaluation unless otherwise noted.  DIAGNOSTIC FINDINGS: DG Lumbar IMPRESSION: Negative.  PATIENT SURVEYS:  FOTO Primary score 69% with goal  of 82% 11/25/23: 59%  COGNITION: Overall cognitive status: Within functional limits for tasks assessed     SENSATION: WFL   MUSCLE LENGTH: Hamstrings: wfl  POSTURE: No Significant postural limitations  PALPATION: TTP left iliopsoas/psoas; left Glut insertion   LOWER EXTREMITY ROM:  [facility]  LOWER EXTREMITY MMT:  MMT Right eval Left eval Right  11/25/23 Left 11/25/23  Hip flexion 49.1 45.2 49.9 40.1  Hip extension 5/5 4/5    Hip abduction 22.6 23.9 39.9 35.3  Hip adduction      Hip  internal rotation  4 no P!    Hip external rotation 5 4 P!    Knee flexion      Knee extension 50.9 53.4 P! Hip flex    Ankle dorsiflexion      Ankle plantarflexion      Ankle inversion      Ankle eversion       (Blank rows = not tested)  LOWER EXTREMITY SPECIAL TESTS:  Hip special tests: Portia Brittle (FABER) test: positive , Trendelenburg test: positive , Thomas test: negative, and SI distraction test: negative  FUNCTIONAL TESTS:  5 times sit to stand: 7.28   GAIT: Unlimited No visual trendelenburg with amb but tests positive with actual test.                                                                                                                                TREATMENT  4/16 Manual: All PROM performed with distraction to reduce pain and improve movement SAD hip grade 3 IT band/lateral quad roll out There-ex: Butterfly stretch 2x30sec Hip hinge stretch 3x Split stance quad stretch 3x each side There-Act Step ups w hip flexion 3x10  Neuro-Re-ed  Lateral band walks 3x28ft (emphasis on tension in band)  Standing hip ext 3x10 each side (emphasis on balance and tension)   4/14  TTP with cross friction across common proximal quad and TFL tendon  STM L TFL and deep hip rotators with IR/ER L LE LAD in open pack 3-4 mins: mullgian lateral mob grade III  Standing hip flexor march YTB loop at toe box 2x10 3s Kneeling hip hinge 10x without; 2x10 with blue band resistance YTB side plank 2x10 2s hold   4/11 Trigger Point Dry Needling  Initial Treatment: Pt instructed on Dry Needling rational, procedures, and possible side effects. Pt instructed to expect mild to moderate muscle soreness later in the day and/or into the next day.  Pt instructed in methods to reduce muscle soreness. Pt instructed to continue prescribed HEP. Because Dry Needling was performed over or adjacent to a lung field, pt was educated on S/S of pneumothorax and to seek  immediate medical attention should they occur.  Patient was educated on signs and symptoms of infection and other risk factors and advised to seek medical attention should they occur.  Patient verbalized understanding of these instructions and education.   Patient  Verbal Consent Given: Yes Education Handout Provided: Yes Muscles Treated: TFL 2 times using 0.30 X50 needle L4 and L5 left paraspinal  2 times using 0.30 X50 needle Electrical Stimulation Performed: No Treatment Response/Outcome: Great twitch response with each needle  Neuro-reed:  90/90 ball  Press and hold 2x15  DKTC 2x15   Bridge with band 2x10   [facility] Adult PT Treatment:                                                DATE: 12/08/23 Manual Therapy: TPR to L TFL, L iliacus, L piriformis, glute medius STM to L hamstring and ITB Therapeutic Exercise: L hamstring, ITB, and adductor stretch with strap  Prone pelvic press with unilateral knee bends x 5; with unilateral hip ext x 5 each; bilat knee bends x 10 Childs pose / cat cow Kneeling hip hinge with cues for neutral pelvis x 8 (slow) Primal push up with L/R hip ext 2 x 2 (painful in R wrist) Earline Glenn pose    [facility] Adult PT Treatment:                                                DATE: 12/03/23  Therapeutic Exercise: Prone pelvic press 5 sec hold x 5 -> with unilateral knee bends x 5; with unilateral hip ext x 5 each Childs pose / cat cow L/R hamstring and adductor stretch with strap  SLR on elbows into hip abdct/ addct x 10 each LE Kneeling hip hinge with cues for neutral pelvis x 10 (slow) Childs pose  Primal push up x 20s -> with L hip ext x 2, R hip ext x 1, -> shoulder taps x 2 each shoulder  Manual Therapy: TPR to L lower lumbar paraspinals STM to L hamstring /  adductors   3/31  STM glute and deep hip rotators with IR/ER  L hip flexor STM through lower abdomen, ischemic pressure  L LE LAD in open pack 3-4 mins  L1-5 CPA and L  UPA grade III  Supine hip  flexor march YTB loop at toe box 2x10 3s holds Latreal step down YTB at knees 3x8   MRI results, evidence around imaging, conservative management options, timelines for tendon related pain, imaging report review and anatomical considerations   3/28 Trigger Point Dry Needling  Initial Treatment: Pt instructed on Dry Needling rational, procedures, and possible side effects. Pt instructed to expect mild to moderate muscle soreness later in the day and/or into the next day.  Pt instructed in methods to reduce muscle soreness. Pt instructed to continue prescribed HEP. Because Dry Needling was performed over or adjacent to a lung field, pt was educated on S/S of pneumothorax and to seek immediate medical attention should they occur.  Patient was educated on signs and symptoms of infection and other risk factors and advised to seek medical attention should they occur.  Patient verbalized understanding of these instructions and education.   Patient Verbal Consent Given: Yes Education Handout Provided: Yes Muscles Treated: TFL 2 times using 0.30 X50 needle L4 and L5 left paraspinal  2 times using 0.30 X50 needle Electrical Stimulation Performed: No Treatment Response/Outcome: Great twitch response with each needle   Manual: skilled  palpation of trigger points. Trigger point release to lumbar paraspinals and left gluteal and TFL.   There-ex:  Ex bike 5 min L2   Neuro re-ed:   Dead lift 3x10 8 inch  Bridge with a band 2x10     PATIENT EDUCATION:  Education details:exercise rationale, progressions/ modifications;  Person educated: Patient Education method: Explanation Education comprehension: verbalized understanding  HOME EXERCISE PROGRAM: [id number]  ASSESSMENT:  CLINICAL IMPRESSION: 4/16 Pt still has baseline anterior hip pain. Distraction was applied followed by manual rollout. All exercises and activities were performed with good form providing cueing for postural adjustments  and muscle activation. Neuro re-ed exercises focused on abdominal bracing and maintaining tension throughout the entire movement. Pt will continue to benefit from skilled physical therapy to reach goals and maximize functional L LE strength and ROM for full return to PLOF and exercise.    Initial impression Patient is a 38 y.o. f who was seen today for physical therapy evaluation and treatment for Left hip pain. Pt reports old injury x 10 years ago which resolved after cortizone injection.  It has given her issue off and on over the past years but exacerbated in the last 4 months. She is an active exerciser and has 2 small children.  Reports she has continued with her exercise regimen backing off some when left hip/knee and ankle begin to hurt. She has stopped running altogether. Xray is neg for lumbar spine dysfunction.  She has positive Trendelenburg sign, demonstrates left glut weakness, does not appear to have SI involvement at todays assessment. Left iliopsoas/psoas muscle tightness present with palpation as well as some tenderness. 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  She will benefit from skilled physical therapy to improve left glut/hip complex strength to normalize bio mechanic stability.  Plan to spend some time in aquatics but majority of session land based.  OBJECTIVE IMPAIRMENTS: decreased activity tolerance, decreased strength, increased muscle spasms, and pain.   ACTIVITY LIMITATIONS: lifting, squatting, and stairs  PARTICIPATION LIMITATIONS:  normal exercise regimen  PERSONAL FACTORS: Fitness and Time since onset of injury/illness/exacerbation are also affecting patient's functional outcome.   REHAB POTENTIAL: Good  CLINICAL DECISION MAKING: Evolving/moderate complexity  EVALUATION COMPLEXITY: Moderate   GOALS: Goals reviewed with patient? Yes  SHORT TERM  GOALS: Target date: 10/12/23 Pt will report decreased pain with exercise submerged to <2/10 and in positions land based which cause pain Baseline:squats/hip flex, descending steps; 6/10 Goal status: NOT MET 10/23/23  2.  Pt will demonstrate indep with aquatic exercises and will make decision on best setting for exercise going forward.  Aquatic HEP to be assigned as approp. Baseline:  Goal status: met 10/23/23   LONG TERM GOALS: Target date: 01/22/24  Pt to meet stated Foto Goal of 82% Baseline: 69%; see above  Goal status: In progress  2.  Pt will tolerate running short distance (under 1 mile) Baseline:  Goal status: Not met - 11/25/23  3.  Pt will descend 1 flight of steps without pain Baseline: painful, but NO increase in pain Goal status: Partially met - 11/25/23  4.  Pt will improve strength in Left hip = to contralateral side to demonstrate improved biomechanical stability of hip Baseline: see above Goal status: In progress - 11/25/23  5.  Pt will return to indep regimen of exercises without limitation due to weakness or pain Baseline: limited;  modifying gym routine Goal status: In progress - 11/25/23  6.  Pt will amb without trendelenburg left hip Baseline: positive tendelenburg Goal status: In progress -11/25/23   PLAN:  PT FREQUENCY: 2x/week  PT DURATION: 8 weeks  PLANNED INTERVENTIONS: 97164- PT Re-evaluation, 97110-Therapeutic exercises, 97530- Therapeutic activity, 97112- Neuromuscular re-education, 97535- Self Care, 63875- Manual therapy, [phone]- Gait training, [phone]- Orthotic Fit/training, [phone]- Aquatic Therapy, [phone]- Electrical stimulation (unattended), [phone]- Ionotophoresis 4mg /ml Dexamethasone, Patient/Family education, Balance training, Stair training, Taping, Dry Needling, Joint mobilization, DME instructions, Cryotherapy, and Moist heat  PLAN FOR NEXT SESSION: hip strengthening special attention to glutes, knee strengthening, gait training, land based consider  DN; stretching program special attention to hip flex, pain control.  Katheran Palms SPT 12/16/23 11:44 AM [facility] [address] [place], [place], [address] Phone: [phone]   Fax:  [phone]  I have reviewed and concur with this student's documentation.   Kitty Perkins, PT 12/16/2023 12:56 PM   During this treatment session, the therapist was present, participating in and directing the treatment.

## 2023-12-30 ENCOUNTER — Other Ambulatory Visit (HOSPITAL_BASED_OUTPATIENT_CLINIC_OR_DEPARTMENT_OTHER): Payer: Self-pay

## 2023-12-30 ENCOUNTER — Ambulatory Visit (HOSPITAL_BASED_OUTPATIENT_CLINIC_OR_DEPARTMENT_OTHER): Payer: Self-pay | Admitting: Orthopaedic Surgery

## 2023-12-30 ENCOUNTER — Ambulatory Visit (HOSPITAL_BASED_OUTPATIENT_CLINIC_OR_DEPARTMENT_OTHER): Admitting: Orthopaedic Surgery

## 2023-12-30 DIAGNOSIS — S73192A Other sprain of left hip, initial encounter: Secondary | ICD-10-CM

## 2023-12-30 MED ORDER — OXYCODONE HCL 5 MG PO TABS
5.0000 mg | ORAL_TABLET | ORAL | 0 refills | Status: AC | PRN
  Filled 2023-12-30: qty 10, 2d supply, fill #0

## 2023-12-30 MED ORDER — ASPIRIN 325 MG PO TBEC
325.0000 mg | DELAYED_RELEASE_TABLET | Freq: Every day | ORAL | 0 refills | Status: AC
  Filled 2023-12-30: qty 14, 14d supply, fill #0

## 2023-12-30 MED ORDER — IBUPROFEN 800 MG PO TABS
800.0000 mg | ORAL_TABLET | Freq: Three times a day (TID) | ORAL | 0 refills | Status: AC
  Filled 2023-12-30: qty 30, 10d supply, fill #0

## 2023-12-30 MED ORDER — ACETAMINOPHEN 500 MG PO TABS
500.0000 mg | ORAL_TABLET | Freq: Three times a day (TID) | ORAL | 0 refills | Status: AC
  Filled 2023-12-30: qty 30, 10d supply, fill #0

## 2023-12-30 NOTE — Progress Notes (Addendum)
 Chief Complaint: Left hip pain     History of Present Illness:    Jasmine Conrad is a 38 y.o. female presents with ongoing left hip pain.  She states that she has had pain in the left hip since a pure barre class for the last several years has noted increasing pain in the femoral acetabular joint.  She has tried activity restriction as well as physical therapy for strengthening.  She is very active and enjoys working out although this is limited as result of the hip pain.  She has had to modify routine.  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[place]    PMH/PSH/Family History/Social History/Meds/Allergies:    Past Medical History:  Diagnosis Date   Depression    Fibroid    Medical history non-contributory    Past Surgical History:  Procedure Laterality Date   CESAREAN SECTION N/A 09/10/2017   Procedure: CESAREAN SECTION;  Surgeon: Dyanna Glasgow, DO;  Location: [facility];  Service: Obstetrics;  Laterality: N/A;  Primary edc 09/16/17 NKDA [name] RNFA   CESAREAN SECTION N/A 08/29/2019   Procedure: CESAREAN SECTION;  Surgeon: Dyanna Glasgow, DO;  Location: [facility];  Service: Obstetrics;  Laterality: N/A;  REPEAT EDC 09/08/19 NKDA [name],  RNFA   WISDOM TOOTH EXTRACTION     Social History   Socioeconomic History   Marital status: Married    Spouse name: Not on file   Number of children: Not on file   Years of education: Not on file   Highest education level: Not on file  Occupational History   Not on file  Tobacco Use   Smoking status: Never   Smokeless tobacco: Never  Vaping Use   Vaping status: Never Used  Substance and Sexual Activity   Alcohol use: No    Comment: not since pregnancy   Drug use: No   Sexual activity: Not Currently    Comment: due to pain asssociated with intercourse   Other Topics Concern   Not on file  Social History Narrative   Not on file   Social Drivers of Health   Financial Resource Strain: Not on  file  Food Insecurity: Not on file  Transportation Needs: Not on file  Physical Activity: Not on file  Stress: Not on file  Social Connections: Not on file   Family History  Problem Relation Age of Onset   Hypertension Paternal Grandmother    Cancer Paternal Grandmother    Cancer Paternal Grandfather    Cancer Maternal Grandmother    Kidney Stones Mother    Diabetes Paternal Uncle    Allergies  Allergen Reactions   Sulfamethoxazole -Trimethoprim  Rash   Current Outpatient Medications  Medication Sig Dispense Refill   acetaminophen  (TYLENOL ) 500 MG tablet Take 1 tablet (500 mg total) by mouth every 8 (eight) hours for 10 days. 30 tablet 0   aspirin  EC 325 MG tablet Take 1 tablet (325 mg total) by mouth daily. 14 tablet 0   ibuprofen  (ADVIL ) 800 MG tablet Take 1 tablet (800 mg total) by mouth every 8 (eight) hours for 10 days. Please take with food, please alternate with acetaminophen  30 tablet 0   oxyCODONE  (ROXICODONE ) 5 MG immediate release tablet Take 1 tablet (5 mg total) by mouth every 4 (four) hours as needed for severe pain (pain score 7-10) or breakthrough pain. 10 tablet 0   LORazepam  (ATIVAN ) 0.5 MG tablet Take 1 tablet by mouth 30-60 minutes prior to procedure, can repeat dose  once at time of procedure if needed. 2 tablet 0   metroNIDAZOLE  (METROGEL ) 0.75 % gel Apply 1 Application topically daily. 45 g 0   No current facility-administered medications for this visit.   No results found.  Review of Systems:   A ROS was performed including pertinent positives and negatives as documented in the HPI.  Physical Exam :   Constitutional: NAD and appears stated age Neurological: Alert and oriented Psych: Appropriate affect and cooperative unknown if currently breastfeeding.   Comprehensive Musculoskeletal Exam:    Left hip with tenderness about the femoral acetabular joint with positive FADIR and 45 degrees of internal rotation and 90 degrees flexion.  External rotation is  to 50 degrees.  Distal neurosensory exam is intact.  Increased passive recoil with external rotation compared to the contralateral side   Imaging:   Xray (4 views left hip): Normal  MRI (left hip): Anterior superior labral tear   I personally reviewed and interpreted the radiographs.   Assessment and Plan:   38 y.o. 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  At this time she has extensively tried physical therapy and activity restriction/modification.  Given this I did discuss the possibility of left hip arthroscopy with labral repair.  Discussed the associated recovery timeframe.  At this time she does have evidence of a traumatic labral tear which has been recalcitrant to physical therapy which is now causing mechanical symptoms, pain, activity and restriction with daily activities and physiological and psychological impairment.  -Plan for left hip arthroscopy with labral repair   After a lengthy discussion of treatment options, including risks, benefits, alternatives, complications of surgical and nonsurgical conservative options, the patient elected surgical repair.   The patient  is aware of the material risks  and complications including, but not limited to injury to adjacent structures, neurovascular injury, infection, numbness, bleeding, implant failure, thermal burns, stiffness, persistent pain, failure to heal, disease transmission from allograft, need for further surgery, dislocation, anesthetic risks, blood clots, risks of death,and others. The probabilities of surgical success and failure discussed with patient given their particular co-morbidities.The time and nature of expected rehabilitation and recovery was discussed.The patient's questions were all answered preoperatively.  No barriers to understanding were noted. I explained the natural history of the disease process and Rx rationale.  I explained to the patient what I considered to be reasonable  expectations given their personal situation.  The final treatment plan was arrived at through a shared patient decision making process model.    I personally saw and evaluated the patient, and participated in the management and treatment plan.  Wilhelmenia Harada, MD Attending Physician, Orthopedic Surgery  This document was dictated using Dragon voice recognition software. A reasonable attempt at proof reading has been made to minimize errors.

## 2024-02-03 ENCOUNTER — Ambulatory Visit (HOSPITAL_BASED_OUTPATIENT_CLINIC_OR_DEPARTMENT_OTHER): Admitting: Orthopaedic Surgery

## 2024-02-03 ENCOUNTER — Ambulatory Visit (HOSPITAL_BASED_OUTPATIENT_CLINIC_OR_DEPARTMENT_OTHER)

## 2024-02-03 DIAGNOSIS — M25551 Pain in right hip: Secondary | ICD-10-CM

## 2024-02-03 DIAGNOSIS — Z01419 Encounter for gynecological examination (general) (routine) without abnormal findings: Secondary | ICD-10-CM | POA: Diagnosis not present

## 2024-02-03 DIAGNOSIS — S73192A Other sprain of left hip, initial encounter: Secondary | ICD-10-CM | POA: Diagnosis not present

## 2024-02-03 DIAGNOSIS — N771 Vaginitis, vulvitis and vulvovaginitis in diseases classified elsewhere: Secondary | ICD-10-CM | POA: Diagnosis not present

## 2024-02-03 DIAGNOSIS — N760 Acute vaginitis: Secondary | ICD-10-CM | POA: Diagnosis not present

## 2024-02-03 DIAGNOSIS — Z6822 Body mass index (BMI) 22.0-22.9, adult: Secondary | ICD-10-CM | POA: Diagnosis not present

## 2024-02-03 NOTE — H&P (View-Only) (Signed)
 Chief Complaint: Left hip pain     History of Present Illness:   02/03/2024: Presents today for discussion of the left hip as well as the right hip as she has been doing now experiencing some pain around the right hip.  Jasmine Conrad is a 38 y.o. female presents with ongoing left hip pain.  She states that she has had pain in the left hip since a pure barre class for the last several years has noted increasing pain in the femoral acetabular joint.  She has tried activity restriction as well as physical therapy for strengthening.  She is very active and enjoys working out although this is limited as result of the hip pain.  She has had to modify routine.  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[place]    PMH/PSH/Family History/Social History/Meds/Allergies:    Past Medical History:  Diagnosis Date   Depression    Fibroid    Medical history non-contributory    Past Surgical History:  Procedure Laterality Date   CESAREAN SECTION N/A 09/10/2017   Procedure: CESAREAN SECTION;  Surgeon: Dyanna Glasgow, DO;  Location: [facility];  Service: Obstetrics;  Laterality: N/A;  Primary edc 09/16/17 NKDA [name] RNFA   CESAREAN SECTION N/A 08/29/2019   Procedure: CESAREAN SECTION;  Surgeon: Dyanna Glasgow, DO;  Location: [facility];  Service: Obstetrics;  Laterality: N/A;  REPEAT EDC 09/08/19 NKDA [name],  RNFA   WISDOM TOOTH EXTRACTION     Social History   Socioeconomic History   Marital status: Married    Spouse name: Not on file   Number of children: Not on file   Years of education: Not on file   Highest education level: Not on file  Occupational History   Not on file  Tobacco Use   Smoking status: Never   Smokeless tobacco: Never  Vaping Use   Vaping status: Never Used  Substance and Sexual Activity   Alcohol use: No    Comment: not since pregnancy   Drug use: No   Sexual activity: Not Currently    Comment: due to pain asssociated with  intercourse   Other Topics Concern   Not on file  Social History Narrative   Not on file   Social Drivers of Health   Financial Resource Strain: Not on file  Food Insecurity: Not on file  Transportation Needs: Not on file  Physical Activity: Not on file  Stress: Not on file  Social Connections: Not on file   Family History  Problem Relation Age of Onset   Hypertension Paternal Grandmother    Cancer Paternal Grandmother    Cancer Paternal Grandfather    Cancer Maternal Grandmother    Kidney Stones Mother    Diabetes Paternal Uncle    Allergies  Allergen Reactions   Sulfamethoxazole -Trimethoprim  Rash   Current Outpatient Medications  Medication Sig Dispense Refill   aspirin  EC 325 MG tablet Take 1 tablet (325 mg total) by mouth daily. 14 tablet 0   LORazepam  (ATIVAN ) 0.5 MG tablet Take 1 tablet by mouth 30-60 minutes prior to procedure, can repeat dose once at time of procedure if needed. 2 tablet 0   metroNIDAZOLE  (METROGEL ) 0.75 % gel Apply 1 Application topically daily. 45 g 0   oxyCODONE  (ROXICODONE ) 5 MG immediate release tablet Take 1 tablet (5 mg total) by mouth every 4 (four) hours as needed for severe pain (pain score 7-10) or breakthrough pain. 10 tablet 0   No current facility-administered  medications for this visit.   No results found.  Review of Systems:   A ROS was performed including pertinent positives and negatives as documented in the HPI.  Physical Exam :   Constitutional: NAD and appears stated age Neurological: Alert and oriented Psych: Appropriate affect and cooperative unknown if currently breastfeeding.   Comprehensive Musculoskeletal Exam:    Left hip with tenderness about the femoral acetabular joint with positive FADIR and 45 degrees of internal rotation and 90 degrees flexion.  External rotation is to 50 degrees.  Distal neurosensory exam is intact.  Increased passive recoil with external rotation compared to the contralateral  side   Imaging:   Xray (4 views left hip): Normal  MRI (left hip): Anterior superior labral tear   I personally reviewed and interpreted the radiographs.   Assessment and Plan:   38 y.o. 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  At this time she has extensively tried physical therapy and activity restriction/modification.  Given this I did discuss the possibility of left hip arthroscopy with labral repair.  Discussed the associated recovery timeframe.  At this time she does have evidence of a traumatic labral tear which has been recalcitrant to physical therapy which is now causing mechanical symptoms, pain, activity and restriction with daily activities and physiological and psychological impairment.  -Plan for left hip arthroscopy with labral repair   After a lengthy discussion of treatment options, including risks, benefits, alternatives, complications of surgical and nonsurgical conservative options, the patient elected surgical repair.   The patient  is aware of the material risks  and complications including, but not limited to injury to adjacent structures, neurovascular injury, infection, numbness, bleeding, implant failure, thermal burns, stiffness, persistent pain, failure to heal, disease transmission from allograft, need for further surgery, dislocation, anesthetic risks, blood clots, risks of death,and others. The probabilities of surgical success and failure discussed with patient given their particular co-morbidities.The time and nature of expected rehabilitation and recovery was discussed.The patient's questions were all answered preoperatively.  No barriers to understanding were noted. I explained the natural history of the disease process and Rx rationale.  I explained to the patient what I considered to be reasonable expectations given their personal situation.  The final treatment plan was arrived at through a shared patient decision making process  model.    I personally saw and evaluated the patient, and participated in the management and treatment plan.  Wilhelmenia Harada, MD Attending Physician, Orthopedic Surgery  This document was dictated using Dragon voice recognition software. A reasonable attempt at proof reading has been made to minimize errors.

## 2024-02-03 NOTE — Progress Notes (Signed)
 Chief Complaint: Left hip pain     History of Present Illness:   02/03/2024: Presents today for discussion of the left hip as well as the right hip as she has been doing now experiencing some pain around the right hip.  Jasmine Conrad is a 38 y.o. female presents with ongoing left hip pain.  She states that she has had pain in the left hip since a pure barre class for the last several years has noted increasing pain in the femoral acetabular joint.  She has tried activity restriction as well as physical therapy for strengthening.  She is very active and enjoys working out although this is limited as result of the hip pain.  She has had to modify routine.  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[place]    PMH/PSH/Family History/Social History/Meds/Allergies:    Past Medical History:  Diagnosis Date   Depression    Fibroid    Medical history non-contributory    Past Surgical History:  Procedure Laterality Date   CESAREAN SECTION N/A 09/10/2017   Procedure: CESAREAN SECTION;  Surgeon: Dyanna Glasgow, DO;  Location: [facility];  Service: Obstetrics;  Laterality: N/A;  Primary edc 09/16/17 NKDA [name] RNFA   CESAREAN SECTION N/A 08/29/2019   Procedure: CESAREAN SECTION;  Surgeon: Dyanna Glasgow, DO;  Location: [facility];  Service: Obstetrics;  Laterality: N/A;  REPEAT EDC 09/08/19 NKDA [name],  RNFA   WISDOM TOOTH EXTRACTION     Social History   Socioeconomic History   Marital status: Married    Spouse name: Not on file   Number of children: Not on file   Years of education: Not on file   Highest education level: Not on file  Occupational History   Not on file  Tobacco Use   Smoking status: Never   Smokeless tobacco: Never  Vaping Use   Vaping status: Never Used  Substance and Sexual Activity   Alcohol use: No    Comment: not since pregnancy   Drug use: No   Sexual activity: Not Currently    Comment: due to pain asssociated with  intercourse   Other Topics Concern   Not on file  Social History Narrative   Not on file   Social Drivers of Health   Financial Resource Strain: Not on file  Food Insecurity: Not on file  Transportation Needs: Not on file  Physical Activity: Not on file  Stress: Not on file  Social Connections: Not on file   Family History  Problem Relation Age of Onset   Hypertension Paternal Grandmother    Cancer Paternal Grandmother    Cancer Paternal Grandfather    Cancer Maternal Grandmother    Kidney Stones Mother    Diabetes Paternal Uncle    Allergies  Allergen Reactions   Sulfamethoxazole -Trimethoprim  Rash   Current Outpatient Medications  Medication Sig Dispense Refill   aspirin  EC 325 MG tablet Take 1 tablet (325 mg total) by mouth daily. 14 tablet 0   LORazepam  (ATIVAN ) 0.5 MG tablet Take 1 tablet by mouth 30-60 minutes prior to procedure, can repeat dose once at time of procedure if needed. 2 tablet 0   metroNIDAZOLE  (METROGEL ) 0.75 % gel Apply 1 Application topically daily. 45 g 0   oxyCODONE  (ROXICODONE ) 5 MG immediate release tablet Take 1 tablet (5 mg total) by mouth every 4 (four) hours as needed for severe pain (pain score 7-10) or breakthrough pain. 10 tablet 0   No current facility-administered  medications for this visit.   No results found.  Review of Systems:   A ROS was performed including pertinent positives and negatives as documented in the HPI.  Physical Exam :   Constitutional: NAD and appears stated age Neurological: Alert and oriented Psych: Appropriate affect and cooperative unknown if currently breastfeeding.   Comprehensive Musculoskeletal Exam:    Left hip with tenderness about the femoral acetabular joint with positive FADIR and 45 degrees of internal rotation and 90 degrees flexion.  External rotation is to 50 degrees.  Distal neurosensory exam is intact.  Increased passive recoil with external rotation compared to the contralateral  side   Imaging:   Xray (4 views left hip): Normal  MRI (left hip): Anterior superior labral tear   I personally reviewed and interpreted the radiographs.   Assessment and Plan:   38 y.o. 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  At this time she has extensively tried physical therapy and activity restriction/modification.  Given this I did discuss the possibility of left hip arthroscopy with labral repair.  Discussed the associated recovery timeframe.  At this time she does have evidence of a traumatic labral tear which has been recalcitrant to physical therapy which is now causing mechanical symptoms, pain, activity and restriction with daily activities and physiological and psychological impairment.  -Plan for left hip arthroscopy with labral repair   After a lengthy discussion of treatment options, including risks, benefits, alternatives, complications of surgical and nonsurgical conservative options, the patient elected surgical repair.   The patient  is aware of the material risks  and complications including, but not limited to injury to adjacent structures, neurovascular injury, infection, numbness, bleeding, implant failure, thermal burns, stiffness, persistent pain, failure to heal, disease transmission from allograft, need for further surgery, dislocation, anesthetic risks, blood clots, risks of death,and others. The probabilities of surgical success and failure discussed with patient given their particular co-morbidities.The time and nature of expected rehabilitation and recovery was discussed.The patient's questions were all answered preoperatively.  No barriers to understanding were noted. I explained the natural history of the disease process and Rx rationale.  I explained to the patient what I considered to be reasonable expectations given their personal situation.  The final treatment plan was arrived at through a shared patient decision making process  model.    I personally saw and evaluated the patient, and participated in the management and treatment plan.  Wilhelmenia Harada, MD Attending Physician, Orthopedic Surgery  This document was dictated using Dragon voice recognition software. A reasonable attempt at proof reading has been made to minimize errors.

## 2024-02-08 ENCOUNTER — Encounter: Payer: Self-pay | Admitting: Family Medicine

## 2024-02-10 ENCOUNTER — Ambulatory Visit (HOSPITAL_BASED_OUTPATIENT_CLINIC_OR_DEPARTMENT_OTHER): Admitting: Orthopaedic Surgery

## 2024-02-15 ENCOUNTER — Other Ambulatory Visit: Payer: Self-pay

## 2024-02-15 ENCOUNTER — Encounter (HOSPITAL_BASED_OUTPATIENT_CLINIC_OR_DEPARTMENT_OTHER): Payer: Self-pay | Admitting: Orthopaedic Surgery

## 2024-02-19 NOTE — Progress Notes (Signed)
 Ensure presurgery (drink by 0415 DOS) and CHG soap given with written and verbal instructions. Pt verbalized understanding.         Enhanced Recovery after Surgery for Orthopedics Enhanced Recovery after Surgery is a protocol used to improve the stress on your body and your recovery after surgery.  Patient Instructions  The night before surgery:  No food after midnight. ONLY clear liquids after midnight  The day of surgery (if you do NOT have diabetes):  Drink ONE (1) Pre-Surgery Clear Ensure as directed.   This drink was given to you during your hospital  pre-op appointment visit. The pre-op nurse will instruct you on the time to drink the  Pre-Surgery Ensure depending on your surgery time. Finish the drink at the designated time by the pre-op nurse.  Nothing else to drink after completing the  Pre-Surgery Clear Ensure.  The day of surgery (if you have diabetes): Drink ONE (1) Gatorade 2 (G2) as directed. This drink was given to you during your hospital  pre-op appointment visit.  The pre-op nurse will instruct you on the time to drink the   Gatorade 2 (G2) depending on your surgery time. Color of the Gatorade may vary. Red is not allowed. Nothing else to drink after completing the  Gatorade 2 (G2).         If you have questions, please contact your surgeon's office.

## 2024-02-22 NOTE — Anesthesia Preprocedure Evaluation (Signed)
 Anesthesia Evaluation  Patient identified by MRN, date of birth, ID band Patient awake    Reviewed: Allergy & Precautions, NPO status , Patient's Chart, lab work & pertinent test results  History of Anesthesia Complications Negative for: history of anesthetic complications  Airway Mallampati: II  TM Distance: >3 FB Neck ROM: Full    Dental  (+) Dental Advisory Given   Pulmonary neg pulmonary ROS   Pulmonary exam normal breath sounds clear to auscultation       Cardiovascular negative cardio ROS  Rhythm:Regular Rate:Normal     Neuro/Psych  PSYCHIATRIC DISORDERS  Depression    negative neurological ROS     GI/Hepatic negative GI ROS, Neg liver ROS,,,  Endo/Other  negative endocrine ROS    Renal/GU negative Renal ROS     Musculoskeletal   Abdominal   Peds  Hematology negative hematology ROS (+) Lab Results      Component                Value               Date                      WBC                      8.6                 12/09/2023                HGB                      12.6                12/09/2023                HCT                      39.0                12/09/2023                MCV                      82                  12/09/2023                PLT                      314                 12/09/2023              Anesthesia Other Findings   Reproductive/Obstetrics fibroid                             Anesthesia Physical Anesthesia Plan  ASA: 1  Anesthesia Plan: General   Post-op Pain Management: Tylenol  PO (pre-op)*, Dilaudid  IV and Ketamine IV*   Induction: Intravenous  PONV Risk Score and Plan: 3 and Ondansetron , Dexamethasone  and Treatment may vary due to age or medical condition  Airway Management Planned: Oral ETT  Additional Equipment:   Intra-op Plan:   Post-operative Plan: Extubation in OR  Informed Consent: I have reviewed the patients History and  Physical, chart, labs and  discussed the procedure including the risks, benefits and alternatives for the proposed anesthesia with the patient or authorized representative who has indicated his/her understanding and acceptance.     Dental advisory given  Plan Discussed with: CRNA and Anesthesiologist  Anesthesia Plan Comments: (Risks of general anesthesia discussed including, but not limited to, sore throat, hoarse voice, chipped/damaged teeth, injury to vocal cords, nausea and vomiting, allergic reactions, lung infection, heart attack, stroke, and death. All questions answered. )       Anesthesia Quick Evaluation

## 2024-02-23 ENCOUNTER — Ambulatory Visit (HOSPITAL_BASED_OUTPATIENT_CLINIC_OR_DEPARTMENT_OTHER): Payer: Self-pay | Admitting: Anesthesiology

## 2024-02-23 ENCOUNTER — Encounter (HOSPITAL_BASED_OUTPATIENT_CLINIC_OR_DEPARTMENT_OTHER): Payer: Self-pay | Admitting: Orthopaedic Surgery

## 2024-02-23 ENCOUNTER — Ambulatory Visit (HOSPITAL_BASED_OUTPATIENT_CLINIC_OR_DEPARTMENT_OTHER)
Admission: RE | Admit: 2024-02-23 | Discharge: 2024-02-23 | Disposition: A | Discharge status: Home / Self Care | Attending: Orthopaedic Surgery | Admitting: Orthopaedic Surgery

## 2024-02-23 ENCOUNTER — Encounter (HOSPITAL_BASED_OUTPATIENT_CLINIC_OR_DEPARTMENT_OTHER)
Admission: RE | Disposition: A | Discharge status: Home / Self Care | Payer: Self-pay | Source: Home / Self Care | Attending: Orthopaedic Surgery

## 2024-02-23 ENCOUNTER — Ambulatory Visit (HOSPITAL_COMMUNITY)

## 2024-02-23 DIAGNOSIS — X58XXXA Exposure to other specified factors, initial encounter: Secondary | ICD-10-CM | POA: Diagnosis not present

## 2024-02-23 DIAGNOSIS — M25352 Other instability, left hip: Secondary | ICD-10-CM | POA: Insufficient documentation

## 2024-02-23 DIAGNOSIS — Z01818 Encounter for other preprocedural examination: Secondary | ICD-10-CM

## 2024-02-23 DIAGNOSIS — S73192D Other sprain of left hip, subsequent encounter: Secondary | ICD-10-CM

## 2024-02-23 DIAGNOSIS — Z96642 Presence of left artificial hip joint: Secondary | ICD-10-CM | POA: Diagnosis not present

## 2024-02-23 DIAGNOSIS — S73192A Other sprain of left hip, initial encounter: Secondary | ICD-10-CM | POA: Diagnosis not present

## 2024-02-23 LAB — POCT PREGNANCY, URINE: Preg Test, Ur: NEGATIVE

## 2024-02-23 SURGERY — ARTHROSCOPY, HIP, WITH LABRUM REPAIR
Anesthesia: General | Site: Hip | Laterality: Left

## 2024-02-23 MED ORDER — PHENYLEPHRINE 80 MCG/ML (10ML) SYRINGE FOR IV PUSH (FOR BLOOD PRESSURE SUPPORT)
PREFILLED_SYRINGE | INTRAVENOUS | Status: AC
  Filled 2024-02-23: qty 10

## 2024-02-23 MED ORDER — DEXMEDETOMIDINE HCL IN NACL 200 MCG/50ML IV SOLN
INTRAVENOUS | Status: DC | PRN
  Administered 2024-02-23: 8 ug via INTRAVENOUS

## 2024-02-23 MED ORDER — CEFAZOLIN SODIUM-DEXTROSE 2-4 GM/100ML-% IV SOLN
INTRAVENOUS | Status: AC
Start: 2024-02-23 — End: 2024-02-23
  Filled 2024-02-23: qty 100

## 2024-02-23 MED ORDER — FENTANYL CITRATE (PF) 100 MCG/2ML IJ SOLN
INTRAMUSCULAR | Status: AC
  Filled 2024-02-23: qty 2

## 2024-02-23 MED ORDER — PROPOFOL 10 MG/ML IV BOLUS
INTRAVENOUS | Status: AC
  Filled 2024-02-23: qty 20

## 2024-02-23 MED ORDER — OXYCODONE HCL 5 MG/5ML PO SOLN: 5.0000 mg | Freq: Once | ORAL | Status: AC | PRN

## 2024-02-23 MED ORDER — MIDAZOLAM HCL 2 MG/2ML IJ SOLN
INTRAMUSCULAR | Status: AC
  Filled 2024-02-23: qty 2

## 2024-02-23 MED ORDER — ROCURONIUM BROMIDE 100 MG/10ML IV SOLN
INTRAVENOUS | Status: DC | PRN
  Administered 2024-02-23: 50 mg via INTRAVENOUS

## 2024-02-23 MED ORDER — DEXAMETHASONE SODIUM PHOSPHATE 4 MG/ML IJ SOLN
INTRAMUSCULAR | Status: DC | PRN
  Administered 2024-02-23: 8 mg via INTRAVENOUS

## 2024-02-23 MED ORDER — GABAPENTIN 300 MG PO CAPS
ORAL_CAPSULE | ORAL | Status: AC
Start: 2024-02-23 — End: 2024-02-23
  Filled 2024-02-23: qty 1

## 2024-02-23 MED ORDER — OXYCODONE HCL 5 MG PO TABS
5.0000 mg | ORAL_TABLET | Freq: Once | ORAL | Status: AC | PRN
  Administered 2024-02-23: 5 mg via ORAL

## 2024-02-23 MED ORDER — HYDROMORPHONE HCL 1 MG/ML IJ SOLN
INTRAMUSCULAR | Status: AC
Start: 2024-02-23 — End: 2024-02-23
  Filled 2024-02-23: qty 0.5

## 2024-02-23 MED ORDER — DEXMEDETOMIDINE HCL IN NACL 80 MCG/20ML IV SOLN
INTRAVENOUS | Status: AC
  Filled 2024-02-23: qty 20

## 2024-02-23 MED ORDER — KETOROLAC TROMETHAMINE 30 MG/ML IJ SOLN
INTRAMUSCULAR | Status: AC
  Filled 2024-02-23: qty 1

## 2024-02-23 MED ORDER — TRANEXAMIC ACID-NACL 1000-0.7 MG/100ML-% IV SOLN
1000.0000 mg | INTRAVENOUS | Status: AC
  Administered 2024-02-23: 1000 mg via INTRAVENOUS

## 2024-02-23 MED ORDER — BUPIVACAINE HCL (PF) 0.25 % IJ SOLN
INTRAMUSCULAR | Status: AC
Start: 2024-02-23 — End: 2024-02-23
  Filled 2024-02-23: qty 120

## 2024-02-23 MED ORDER — EPINEPHRINE PF 1 MG/ML IJ SOLN
INTRAMUSCULAR | Status: AC
Start: 2024-02-23 — End: 2024-02-23
  Filled 2024-02-23: qty 6

## 2024-02-23 MED ORDER — CEFAZOLIN SODIUM-DEXTROSE 2-4 GM/100ML-% IV SOLN
2.0000 g | INTRAVENOUS | Status: AC
  Administered 2024-02-23: 2 g via INTRAVENOUS

## 2024-02-23 MED ORDER — SODIUM CHLORIDE 0.9 % IR SOLN
Status: DC | PRN
  Administered 2024-02-23: 6000 mL

## 2024-02-23 MED ORDER — OXYCODONE HCL 5 MG PO TABS
ORAL_TABLET | ORAL | Status: AC
  Filled 2024-02-23: qty 1

## 2024-02-23 MED ORDER — ONDANSETRON HCL 4 MG/2ML IJ SOLN
INTRAMUSCULAR | Status: AC
  Filled 2024-02-23: qty 2

## 2024-02-23 MED ORDER — LACTATED RINGERS IV SOLN: INTRAVENOUS | Status: DC

## 2024-02-23 MED ORDER — FENTANYL CITRATE (PF) 100 MCG/2ML IJ SOLN
INTRAMUSCULAR | Status: DC | PRN
  Administered 2024-02-23 (×2): 50 ug via INTRAVENOUS

## 2024-02-23 MED ORDER — HYDROMORPHONE HCL 1 MG/ML IJ SOLN
INTRAMUSCULAR | Status: AC
  Filled 2024-02-23: qty 0.5

## 2024-02-23 MED ORDER — HYDROMORPHONE HCL 1 MG/ML IJ SOLN
0.2500 mg | INTRAMUSCULAR | Status: DC | PRN
  Administered 2024-02-23 (×4): 0.25 mg via INTRAVENOUS

## 2024-02-23 MED ORDER — TRANEXAMIC ACID-NACL 1000-0.7 MG/100ML-% IV SOLN
INTRAVENOUS | Status: AC
Start: 2024-02-23 — End: 2024-02-23
  Filled 2024-02-23: qty 100

## 2024-02-23 MED ORDER — DEXAMETHASONE SODIUM PHOSPHATE 10 MG/ML IJ SOLN
INTRAMUSCULAR | Status: AC
  Filled 2024-02-23: qty 1

## 2024-02-23 MED ORDER — PROPOFOL 10 MG/ML IV BOLUS
INTRAVENOUS | Status: DC | PRN
  Administered 2024-02-23: 200 mg via INTRAVENOUS

## 2024-02-23 MED ORDER — SUGAMMADEX SODIUM 200 MG/2ML IV SOLN
INTRAVENOUS | Status: DC | PRN
  Administered 2024-02-23: 200 mg via INTRAVENOUS

## 2024-02-23 MED ORDER — GABAPENTIN 300 MG PO CAPS
300.0000 mg | ORAL_CAPSULE | Freq: Once | ORAL | Status: AC
  Administered 2024-02-23: 300 mg via ORAL

## 2024-02-23 MED ORDER — MIDAZOLAM HCL 5 MG/5ML IJ SOLN
INTRAMUSCULAR | Status: DC | PRN
  Administered 2024-02-23: 2 mg via INTRAVENOUS

## 2024-02-23 MED ORDER — KETOROLAC TROMETHAMINE 30 MG/ML IJ SOLN
INTRAMUSCULAR | Status: DC | PRN
Start: 2024-02-23 — End: 2024-02-23
  Administered 2024-02-23: 30 mg via INTRAVENOUS

## 2024-02-23 MED ORDER — HEPARIN SOD (PORK) LOCK FLUSH 100 UNIT/ML IV SOLN
INTRAVENOUS | Status: AC
Start: 2024-02-23 — End: 2024-02-23
  Filled 2024-02-23: qty 5

## 2024-02-23 MED ORDER — ACETAMINOPHEN 500 MG PO TABS
1000.0000 mg | ORAL_TABLET | Freq: Once | ORAL | Status: AC
  Administered 2024-02-23: 1000 mg via ORAL

## 2024-02-23 MED ORDER — ROCURONIUM BROMIDE 10 MG/ML (PF) SYRINGE
PREFILLED_SYRINGE | INTRAVENOUS | Status: AC
  Filled 2024-02-23: qty 10

## 2024-02-23 MED ORDER — ONDANSETRON HCL 4 MG/2ML IJ SOLN
INTRAMUSCULAR | Status: DC | PRN
Start: 2024-02-23 — End: 2024-02-23
  Administered 2024-02-23: 4 mg via INTRAVENOUS

## 2024-02-23 MED ORDER — PHENYLEPHRINE HCL (PRESSORS) 10 MG/ML IV SOLN
INTRAVENOUS | Status: DC | PRN
Start: 2024-02-23 — End: 2024-02-23
  Administered 2024-02-23: 80 ug via INTRAVENOUS

## 2024-02-23 MED ORDER — BUPIVACAINE HCL 0.25 % IJ SOLN
INTRAMUSCULAR | Status: DC | PRN
  Administered 2024-02-23: 20 mL

## 2024-02-23 MED ORDER — HEPARIN (PORCINE) IN NACL 1000-0.9 UT/500ML-% IV SOLN
INTRAVENOUS | Status: AC
  Filled 2024-02-23: qty 500

## 2024-02-23 MED ORDER — LIDOCAINE HCL (CARDIAC) PF 50 MG/5ML IV SOSY
PREFILLED_SYRINGE | INTRAVENOUS | Status: DC | PRN
  Administered 2024-02-23: 80 mg via INTRAVENOUS

## 2024-02-23 MED ORDER — AMISULPRIDE (ANTIEMETIC) 5 MG/2ML IV SOLN: 10.0000 mg | Freq: Once | INTRAVENOUS | Status: DC | PRN

## 2024-02-23 MED ORDER — LIDOCAINE 2% (20 MG/ML) 5 ML SYRINGE
INTRAMUSCULAR | Status: AC
  Filled 2024-02-23: qty 5

## 2024-02-23 MED ORDER — ACETAMINOPHEN 500 MG PO TABS
ORAL_TABLET | ORAL | Status: AC
  Filled 2024-02-23: qty 2

## 2024-02-23 SURGICAL SUPPLY — 58 items
ANCHOR SUT 1.4 FLEX (Anchor) IMPLANT
BIT DRILL FLEX NANOTACK (BIT) IMPLANT
BLADE SAMURAI STR FULL RADIUS (BLADE) IMPLANT
BLADE SURG 11 STRL SS (BLADE) ×1 IMPLANT
CANISTER SUCT 1200ML W/VALVE (MISCELLANEOUS) ×1 IMPLANT
CANNULA OBTURATOR FLOWPORT ST5 (CANNULA) IMPLANT
CHLORAPREP W/TINT 26 (MISCELLANEOUS) ×1 IMPLANT
COOLER ICEMAN CLASSIC (MISCELLANEOUS) ×1 IMPLANT
COVER BACK TABLE 60X90IN (DRAPES) ×1 IMPLANT
COVER MAYO STAND STRL (DRAPES) ×2 IMPLANT
DERMABOND ADVANCED .7 DNX12 (GAUZE/BANDAGES/DRESSINGS) IMPLANT
DISSECTOR 4.2MMX19CM HL (MISCELLANEOUS) ×1 IMPLANT
DRAPE C-ARM 42X72 X-RAY (DRAPES) ×1 IMPLANT
DRAPE STERI IOBAN 125X83 (DRAPES) IMPLANT
DRAPE U-SHAPE 47X51 STRL (DRAPES) ×2 IMPLANT
DRSG TEGADERM 4X4.75 (GAUZE/BANDAGES/DRESSINGS) ×3 IMPLANT
FEE RENTAL EQUIP HIP INSTR KIT (INSTRUMENTS) IMPLANT
GAUZE PAD ABD 8X10 STRL (GAUZE/BANDAGES/DRESSINGS) IMPLANT
GAUZE SPONGE 4X4 12PLY STRL (GAUZE/BANDAGES/DRESSINGS) ×1 IMPLANT
GAUZE XEROFORM 1X8 LF (GAUZE/BANDAGES/DRESSINGS) ×1 IMPLANT
GLOVE BIO SURGEON STRL SZ 6 (GLOVE) ×2 IMPLANT
GLOVE BIO SURGEON STRL SZ7.5 (GLOVE) ×2 IMPLANT
GLOVE BIOGEL PI IND STRL 6.5 (GLOVE) ×1 IMPLANT
GLOVE BIOGEL PI IND STRL 8 (GLOVE) ×1 IMPLANT
GOWN STRL REUS W/ TWL LRG LVL3 (GOWN DISPOSABLE) ×2 IMPLANT
GOWN STRL REUS W/TWL XL LVL3 (GOWN DISPOSABLE) ×1 IMPLANT
INSTRUMENT ORTHO TEXT HIP FEM (INSTRUMENTS) IMPLANT
KIT PATIENT POSITION MEDIUM (KITS) IMPLANT
KIT PORTAL ENTRY HIP ACCESS (KITS) IMPLANT
MANIFOLD NEPTUNE II (INSTRUMENTS) ×1 IMPLANT
NDL HYPO 22X1.5 SAFETY MO (MISCELLANEOUS) IMPLANT
NDL INJECTOR II CARTRIDGE (MISCELLANEOUS) IMPLANT
NDL SPNL 18GX3.5 QUINCKE PK (NEEDLE) ×1 IMPLANT
NDL SUT 6 .5 CRC .975X.05 MAYO (NEEDLE) IMPLANT
NEEDLE HYPO 22X1.5 SAFETY MO (MISCELLANEOUS) IMPLANT
NEEDLE INJECTOR II CARTRIDGE (MISCELLANEOUS) ×1 IMPLANT
NEEDLE SPNL 18GX3.5 QUINCKE PK (NEEDLE) ×1 IMPLANT
PACK BASIN DAY SURGERY FS (CUSTOM PROCEDURE TRAY) ×1 IMPLANT
PAD COLD SHLDR WRAP-ON (PAD) ×1 IMPLANT
PASSER SUT 1.5D CRESCENT (INSTRUMENTS) IMPLANT
PASSER SUT 70D UP ANGLED (INSTRUMENTS) IMPLANT
SPIKE FLUID TRANSFER (MISCELLANEOUS) IMPLANT
SPONGE T-LAP 18X18 ~~LOC~~+RFID (SPONGE) IMPLANT
SUCTION TUBE FRAZIER 10FR DISP (SUCTIONS) IMPLANT
SUT ETHILON 3 0 PS 1 (SUTURE) ×1 IMPLANT
SUT VIC AB 0 CT1 27XBRD ANBCTR (SUTURE) IMPLANT
SUT VIC AB 2-0 CT1 TAPERPNT 27 (SUTURE) IMPLANT
SUT XBRAID 1.4 WHITE/BLUE (SUTURE) IMPLANT
SUTURE FIBERWR #2 38 T-5 BLUE (SUTURE) IMPLANT
SUTURE TAPE 1.3 FIBERLOP 20 ST (SUTURE) IMPLANT
SYR 20ML LL LF (SYRINGE) IMPLANT
SYR 50ML LL SCALE MARK (SYRINGE) ×1 IMPLANT
TOWEL GREEN STERILE FF (TOWEL DISPOSABLE) ×2 IMPLANT
TRAY ARTHROSCOPY HIP STRE FEE (INSTRUMENTS) ×1 IMPLANT
TRAY PIVOT PORT STRE FEE (INSTRUMENTS) ×1 IMPLANT
TUBE CONNECTING 20X1/4 (TUBING) ×3 IMPLANT
TUBING ARTHROSCOPY IRRIG 16FT (MISCELLANEOUS) ×1 IMPLANT
WAND APOLLO RF 50D ABLATOR (BUR) ×1 IMPLANT

## 2024-02-23 NOTE — Transfer of Care (Signed)
 Immediate Anesthesia Transfer of Care Note  Patient: Jasmine Conrad  Procedure(s) Performed: ARTHROSCOPY, HIP, WITH LABRUM REPAIR (Left: Hip)  Patient Location: PACU  Anesthesia Type:General  Level of Consciousness: awake, alert , and oriented  Airway & Oxygen Therapy: Patient Spontanous Breathing and Patient connected to face mask oxygen  Post-op Assessment: Report given to RN and Post -op Vital signs reviewed and stable  Post vital signs: Reviewed and stable  Last Vitals:  Vitals Value Taken Time  BP 104/71 02/23/24 08:50  Temp    Pulse 103 02/23/24 08:54  Resp 15 02/23/24 08:54  SpO2 100 % 02/23/24 08:54  Vitals shown include unfiled device data.  Last Pain:  Vitals:   02/23/24 0634  TempSrc: Temporal  PainSc: 2       Patients Stated Pain Goal: 7 (02/23/24 9365)  Complications: No notable events documented.

## 2024-02-23 NOTE — Interval H&P Note (Signed)
 History and Physical Interval Note:  02/23/2024 7:10 AM  Jasmine Conrad  has presented today for surgery, with the diagnosis of LEFT HIP LABRAL TEAR.  The various methods of treatment have been discussed with the patient and family. After consideration of risks, benefits and other options for treatment, the patient has consented to  Procedure(s): ARTHROSCOPY, HIP, WITH LABRUM REPAIR (Left) as a surgical intervention.  The patient's history has been reviewed, patient examined, no change in status, stable for surgery.  I have reviewed the patient's chart and labs.  Questions were answered to the patient's satisfaction.     Nike Southwell

## 2024-02-23 NOTE — Discharge Instructions (Addendum)
 Discharge Instructions    Attending Surgeon: Elspeth Parker, MD Office Phone Number: [phone]   Diagnosis and Procedures:    Surgeries Performed: Left hip labral repair  Discharge Plan:    Diet: Resume usual diet. Begin with light or bland foods.  Drink plenty of fluids.  Activity:  Weight bearing as tolerated left leg. You are advised to go home directly from the hospital or surgical center. Restrict your activities.  GENERAL INSTRUCTIONS: 1.  Please apply ice to your wound to help with swelling and inflammation. This will improve your comfort and your overall recovery following surgery.     2. Please call Dr. Danetta office at [phone] with questions Monday-Friday during business hours. If no one answers, please leave a message and someone should get back to the patient within 24 hours. For emergencies please call 911 or proceed to the emergency room.   3. Patient to notify surgical team if experiences any of the following: Bowel/Bladder dysfunction, uncontrolled pain, nerve/muscle weakness, incision with increased drainage or redness, nausea/vomiting and Fever greater than 101.0 F.  Be alert for signs of infection including redness, streaking, odor, fever or chills. Be alert for excessive pain or bleeding and notify your surgeon immediately.  WOUND INSTRUCTIONS:   Leave your dressing, cast, or splint in place until your post operative visit.  Keep it clean and dry.  Always keep the incision clean and dry until the staples/sutures are removed. If there is no drainage from the incision you should keep it open to air. If there is drainage from the incision you must keep it covered at all times until the drainage stops  Do not soak in a bath tub, hot tub, pool, lake or other body of water until 21 days after your surgery and your incision is completely dry and healed.  If you have removable sutures (or staples) they must be removed 10-14 days (unless otherwise  instructed) from the day of your surgery.     1)  Elevate the extremity as much as possible.  2)  Keep the dressing clean and dry.  3)  Please call us  if the dressing becomes wet or dirty.  4)  If you are experiencing worsening pain or worsening swelling, please call.     MEDICATIONS: Resume all previous home medications at the previous prescribed dose and frequency unless otherwise noted Start taking the  pain medications on an as-needed basis as prescribed  Please taper down pain medication over the next week following surgery.  Ideally you should not require a refill of any narcotic pain medication.  Take pain medication with food to minimize nausea. In addition to the prescribed pain medication, you may take over-the-counter pain relievers such as Tylenol .  Do NOT take additional tylenol  if your pain medication already has tylenol  in it.  Aspirin  325mg  daily per instructions on bottle. Narcotic policy: Per [facility] clinic policy, our goal is ensure optimal postoperative pain control with a multimodal pain management strategy. For all [facility] patients, our goal is to wean post-operative narcotic medications by 6 weeks post-operatively, and many times sooner. If this is not possible due to utilization of pain medication prior to surgery, your [facility] doctor will support your acute post-operative pain control for the first 6 weeks postoperatively, with a plan to transition you back to your primary pain team following that. Maralee will work to ensure a Therapist, occupational.       FOLLOWUP INSTRUCTIONS: 1. Follow up at the Physical Therapy  Clinic 3-4 days following surgery. This appointment should be scheduled unless other arrangements have been made.The Physical Therapy scheduling number is [phone] if an appointment has not already been arranged.  2. Contact Dr. Danetta office during office hours at [phone] or the practice after hours line at [phone] for non-emergencies.  For medical emergencies call 911.   Discharge Location: Home  No Tylenol  until 12:39 pm, if needed  Oxycodone  5mg  taken at 10:03 am

## 2024-02-23 NOTE — Anesthesia Procedure Notes (Signed)
 Procedure Name: Intubation Date/Time: 02/23/2024 7:42 AM  Performed by: Buster Catheryn SAUNDERS, CRNAPre-anesthesia Checklist: Patient identified, Emergency Drugs available, Suction available and Patient being monitored Patient Re-evaluated:Patient Re-evaluated prior to induction Oxygen Delivery Method: Circle system utilized Preoxygenation: Pre-oxygenation with 100% oxygen Induction Type: IV induction Ventilation: Mask ventilation without difficulty Laryngoscope Size: Miller and 2 Grade View: Grade II Tube type: Oral Tube size: 7.0 mm Number of attempts: 1 Airway Equipment and Method: Stylet and Oral airway Placement Confirmation: ETT inserted through vocal cords under direct vision, positive ETCO2 and breath sounds checked- equal and bilateral Secured at: 23 cm Tube secured with: Tape Dental Injury: Teeth and Oropharynx as per pre-operative assessment

## 2024-02-23 NOTE — Anesthesia Postprocedure Evaluation (Signed)
 Anesthesia Post Note  Patient: Jasmine Conrad  Procedure(s) Performed: ARTHROSCOPY, HIP, WITH LABRUM REPAIR (Left: Hip)     Patient location during evaluation: PACU Anesthesia Type: General Level of consciousness: awake Pain management: pain level controlled Vital Signs Assessment: post-procedure vital signs reviewed and stable Respiratory status: spontaneous breathing, nonlabored ventilation and respiratory function stable Cardiovascular status: blood pressure returned to baseline and stable Postop Assessment: no apparent nausea or vomiting Anesthetic complications: no   No notable events documented.  Last Vitals:  Vitals:   02/23/24 0945 02/23/24 0958  BP: 96/70 101/62  Pulse: 75 78  Resp: 10 15  Temp:  36.6 C  SpO2: 97% 98%    Last Pain:  Vitals:   02/23/24 1003  TempSrc:   PainSc: 4                  Delon Aisha Arch

## 2024-02-23 NOTE — Op Note (Signed)
 Date of Surgery: 02/23/2024  INDICATIONS: Ms. Coomer is a 38 y.o.-year-old female with left hip labral tear.  The risk and benefits of the procedure were discussed in detail and documented in the pre-operative evaluation.   PREOPERATIVE DIAGNOSIS: 1. Left hip labral tear, instability  POSTOPERATIVE DIAGNOSIS: Same.  PROCEDURE: 1. Left hip labral repair  SURGEON: Elspeth LITTIE Parker MD  ASSISTANT: Conley Dawson, ATC  ANESTHESIA:  general  IV FLUIDS AND URINE: See anesthesia record.  ANTIBIOTICS: Ancef   ESTIMATED BLOOD LOSS: 10 mL.  IMPLANTS:  Implant Name Type Inv. Item Serial No. Manufacturer Lot No. LRB No. Used Action  ANCHOR SUT 1.4 FLEX - [id number] Anchor ANCHOR SUT 1.4 FLEX  [employer] [id number] Left 2 Implanted    DRAINS: None  CULTURES: None  COMPLICATIONS: none  DESCRIPTION OF PROCEDURE:   Cartilage Intact femoral and acetabular cartilage   Labrum Torn appearing/normoplastic/injected   Boundaries of labral tear Convention (3 o'clock anterior, 9 o'clock posterior) Anterior boundary: 3 o'clock Posterior boundary: 1 o'clock   OPERATIVE REPORT:  The patient was brought to the operating room, placed supine on the operating table, and bony prominences were padded.  The traction boots were applied with padding to ensure that safe traction could be applied through the feet.  The contralateral limb was abducted maximally and light traction was applied.  The operative leg was brought into neutral position.  The flouroscopic c-arm was brought between the legs for an AP image.  The patient was prepped and draped in a sterile fashion.  Time-out was performed and landmarks were identified. Traction was obtained and care was taken to ensure the least amount of force necessary to allow safe access to the joint of 8-68mm.  This was checked with fluoroscopy.    Next we placed an anterolateral portal under the assistance of fluoroscopy.  First, fluoroscopy was used to  estimate the trajectory and starting point.  A 5mm incision with a #11 blade was made and a straight hemostat was used to dilate the portal through the appropriate tract.  We then placed a 14-gauge hypodermic needle with careful technique to be as close to the femoral head as possible and parallel to the sorcele to ensure no iatrogenic damage to the labrum.  This released the negative pressure environment and the amount of traction was adjusted to maintain the 8-74mm of distraction.  A nitinol wire was placed through the needle and flouroscopy was used to ensure it extended to the medial wall of the acetabulum.  The Flowport from [employer] was placed over the wire and the nitinol wire was retracted to just inside the capsule during insertion of the dilator and cannula to minimize the risk of breakage. The arthroscope was placed next and we visualized the anterior triangle.     We then placed the anterior portal under direct visualization using the technique described above.  This was safely placed as well without damage to the labrum or femoral head.  We then switched our arthroscope to the anterior portal to ensure we were not through the labrum - we were safely through the capsule only.  We then proceeded with a transverse capsulotomy connecting the 2 portals in the same plane utilizing the Samurai blade from [employer].  We identified the anterior inferior iliac spine proximally, the psoas tendon medially and the rectus tendon laterally as landmarks.  We then proceeded with a diagnostic arthroscopy - the results can be found in the findings section above.  We then used the 50 degree hip specific radiofrequency device from [employer]. and a 4mm shaver to clear the superior acetabulum and expose the subspinous region.  Next we exposed the acetabular rim leaving the chondral labral junction intact.  Working from both portals, the acetabular rim/subspinous region was reshaped with 5.5 mm  bur consistent with the preoperative three-dimensional imaging.   When adequate reshaping was obtained we then proceeded with the labral repair. We placed 2 anchors at the 1:00 and 2:30 positions. The sutures were passed using the crescent Nanopass from [employer].  This resulted in anatomic labral repair.  We debrided the loose cartilage at the rim and residual degenerative labral tissue.  Traction was let down with total traction time of 27 minutes.     Finally, we performed a complete capsular closure with tape suture.  She was replaced in the anterior and posterior limb of the reported capsulotomy with excellent apposition. We then removed the arthroscope and closed the incisions with 3-0 nylon simple stitches.  A sterile dressing was applied..  The patient was awakened from anesthesia and transferred to PACU in stable condition. Postoperative care includes:       POSTOPERATIVE PLAN:    Weight bearing as tolerated left leg Formal physical therapy will begin this week. ASA 325 Daily for DVT prophylaxis      Elspeth LITTIE Parker, MD 8:38 AM

## 2024-02-23 NOTE — Brief Op Note (Signed)
   Brief Op Note  Date of Surgery: 02/23/2024  Preoperative Diagnosis: LEFT HIP LABRAL TEAR  Postoperative Diagnosis: same  Procedure: Procedure(s): ARTHROSCOPY, HIP, WITH LABRUM REPAIR  Implants: Implant Name Type Inv. Item Serial No. Manufacturer Lot No. LRB No. Used Action  ANCHOR SUT 1.4 FLEX - [id number] Anchor ANCHOR SUT 1.4 FLEX  STRYKER ENDOSCOPY [id number] Left 2 Implanted    Surgeons: Surgeon(s): Genelle Standing, MD  Anesthesia: General    Estimated Blood Loss: See anesthesia record  Complications: None  Condition to PACU: Stable  Standing LITTIE Genelle, MD 02/23/2024 8:38 AM

## 2024-02-26 ENCOUNTER — Encounter (HOSPITAL_BASED_OUTPATIENT_CLINIC_OR_DEPARTMENT_OTHER): Payer: Self-pay | Admitting: Physical Therapy

## 2024-02-26 ENCOUNTER — Ambulatory Visit (HOSPITAL_BASED_OUTPATIENT_CLINIC_OR_DEPARTMENT_OTHER): Admitting: Physical Therapy

## 2024-02-26 ENCOUNTER — Other Ambulatory Visit: Payer: Self-pay

## 2024-02-26 ENCOUNTER — Ambulatory Visit (HOSPITAL_BASED_OUTPATIENT_CLINIC_OR_DEPARTMENT_OTHER): Attending: Family Medicine | Admitting: Physical Therapy

## 2024-02-26 DIAGNOSIS — S73192A Other sprain of left hip, initial encounter: Secondary | ICD-10-CM | POA: Insufficient documentation

## 2024-02-26 DIAGNOSIS — R29898 Other symptoms and signs involving the musculoskeletal system: Secondary | ICD-10-CM | POA: Insufficient documentation

## 2024-02-26 DIAGNOSIS — M25552 Pain in left hip: Secondary | ICD-10-CM | POA: Diagnosis present

## 2024-02-26 DIAGNOSIS — R2689 Other abnormalities of gait and mobility: Secondary | ICD-10-CM | POA: Insufficient documentation

## 2024-02-26 NOTE — Therapy (Signed)
 OUTPATIENT PHYSICAL THERAPY LOWER EXTREMITY EVALUATION   Patient Name: Jasmine Conrad MRN: [id number] DOB:[birth year], 38 y.o., female Today's Date: 02/26/2024  END OF SESSION:  PT End of Session - 02/26/24 1201     Visit Number 21    Number of Visits 28    Date for PT Re-Evaluation 01/22/24          Past Medical History:  Diagnosis Date   Depression    Fibroid    Medical history non-contributory    Past Surgical History:  Procedure Laterality Date   CESAREAN SECTION N/A 09/10/2017   Procedure: CESAREAN SECTION;  Surgeon: Dannielle Bouchard, DO;  Location: [facility];  Service: Obstetrics;  Laterality: N/A;  Primary edc 09/16/17 NKDA [name] RNFA   CESAREAN SECTION N/A 08/29/2019   Procedure: CESAREAN SECTION;  Surgeon: Dannielle Bouchard, DO;  Location: [facility];  Service: Obstetrics;  Laterality: N/A;  REPEAT EDC 09/08/19 NKDA [name],  RNFA   WISDOM TOOTH EXTRACTION     Patient Active Problem List   Diagnosis Date Noted   Tear of left acetabular labrum 02/23/2024   Previous cesarean section 08/29/2019   S/P cesarean section 09/10/2017   Trauma during pregnancy 07/03/2017   Left shoulder pain 04/01/2016    PCP: Arnett Repress MD   REFERRING PROVIDER:  Elspeth Parker MD  REFERRING DIAG:  Diagnosis  [phone] (ICD-10-CM) - Tear of left acetabular labrum, initial encounter    THERAPY DIAG:  Left hip pain  Weakness of left hip  Other abnormalities of gait and mobility  Rationale for Evaluation and Treatment: Rehabilitation  ONSET DATE: 02/23/2024  SUBJECTIVE:   SUBJECTIVE STATEMENT: She has a long history of left hip pain.  He trialed conservative therapy with no significant improvement in pain.  On 02/23/2024 she had a left hip labral repair.  Her pain is well-controlled at this time.  She is not using any assistive device.  PERTINENT HISTORY: Nothing pertinent PAIN:  Are you having pain? Yes: NPRS scale: 4 out of 10 consistently Pain location:  Anterior hip Pain description: aching  Aggravating factors: prolonged positioning  Relieving factors: medication and ice   PRECAUTIONS: None  RED FLAGS: None   WEIGHT BEARING RESTRICTIONS: No  FALLS:  Has patient fallen in last 6 months? No  LIVING ENVIRONMENT: Has steps at her house   OCCUPATION:  Works for [employer].  Off this week but remote next week.   PLOF: Independent  PATIENT GOALS:    NEXT MD VISIT:  Next week   OBJECTIVE:  Note: Objective measures were completed at Evaluation unless otherwise noted.  DIAGNOSTIC FINDINGS:  Nothing post op   PATIENT SURVEYS:  LEFS  Extreme difficulty/unable (0), Quite a bit of difficulty (1), Moderate difficulty (2), Little difficulty (3), No difficulty (4) Survey date:    Any of your usual work, housework or school activities   2. Usual hobbies, recreational or sporting activities   3. Getting into/out of the bath   4. Walking between rooms   5. Putting on socks/shoes   6. Squatting    7. Lifting an object, like a bag of groceries from the floor   8. Performing light activities around your home   9. Performing heavy activities around your home   10. Getting into/out of a car   11. Walking 2 blocks   12. Walking 1 mile   13. Going up/down 10 stairs (1 flight)   14. Standing for 1 hour   15.  sitting for 1 hour  16. Running on even ground   17. Running on uneven ground   18. Making sharp turns while running fast   19. Hopping    20. Rolling over in bed   Score total:  14/80     COGNITION: Overall cognitive status: Within functional limits for tasks assessed     SENSATION: WFL   POSTURE: No Significant postural limitations  PALPATION: No unexpected TTP  LOWER EXTREMITY ROM:  Passive ROM Right eval Left eval  Hip flexion  90  Hip extension    Hip abduction    Hip adduction    Hip internal rotation    Hip external rotation  18  Knee flexion    Knee extension    Ankle dorsiflexion    Ankle  plantarflexion    Ankle inversion    Ankle eversion     (Blank rows = not tested)  LOWER EXTREMITY MMT:  MMT Right eval Left eval  Hip flexion    Hip extension    Hip abduction    Hip adduction    Hip internal rotation    Hip external rotation    Knee flexion    Knee extension    Ankle dorsiflexion    Ankle plantarflexion    Ankle inversion    Ankle eversion     (Blank rows = not tested) not tested 2nd to recent surgery    GAIT: Mild lateral movement with gait Decreased left single leg stance time                                                                                                                               TREATMENT DATE:   Manual:  Reviewed self soft tissue mobilization   There-ex:  Quad set 2x10  Bent knee fallouts x12  Prone lying  Prone knee flexion x12        PATIENT EDUCATION:  Education details: HEP, symptom management  Person educated: Patient Education method: Programmer, multimedia, Demonstration, Actor cues, Verbal cues, and Handouts Education comprehension: verbalized understanding, returned demonstration, verbal cues required, tactile cues required, and needs further education  HOME EXERCISE PROGRAM: Access Code: [email or web address] URL: [email or web address] Date: 02/26/2024 Prepared by: Alm Don  Exercises - Bent Knee Fallouts  - 1 x daily - 7 x weekly - 3 sets - 10 reps - Prone Knee Flexion  - 1 x daily - 7 x weekly - 3 sets - 10 reps - Supine Quad Set  - 1 x daily - 7 x weekly - 3 sets - 10 reps  ASSESSMENT:  CLINICAL IMPRESSION: Patient is a 38 year old female status post left hip labral repair on 02/23/2024.  At this time she presents with expected limitations in range of motion, strength, and general functional mobility.  She would benefit from skilled therapy to improve ability to perform ADLs and return to active lifestyle. OBJECTIVE IMPAIRMENTS: Abnormal gait, decreased activity tolerance, difficulty walking,  decreased ROM, decreased strength, and pain.  ACTIVITY LIMITATIONS: carrying, lifting, sitting, standing, squatting, sleeping, stairs, transfers, bed mobility, bathing, and dressing  PARTICIPATION LIMITATIONS: meal prep, cleaning, laundry, driving, shopping, community activity, and occupation  PERSONAL FACTORS: None   REHAB POTENTIAL: Excellent  CLINICAL DECISION MAKING: Stable/uncomplicated  EVALUATION COMPLEXITY: Low   GOALS: Goals reviewed with patient? Yes  SHORT TERM GOALS: Target date: 03/25/2024   Patient will increase passive right hip flexion 90 degrees Baseline: Goal status: INITIAL  2.  Patient will wean off assistive device as tolerated Baseline:  Goal status: INITIAL  3.  Patient will be independent with basic HEP Baseline:  Goal status: INITIAL   LONG TERM GOALS: Target date: 05/20/2024    Patient will go up and down 8 steps with reciprocal gait pattern Baseline:  Goal status: INITIAL  2.  Patient will ambulate community distances without increased pain Baseline:  Goal status: INITIAL  3.  Patient will stand for greater than 1 hour without increased pain in order to perform ADLs Baseline:  Goal status: INITIAL  4.  Patient will return to full exercise routine Baseline:  Goal status: INITIAL    PLAN:  PT FREQUENCY: 2x/week  PT DURATION: 12 weeks  PLANNED INTERVENTIONS: 97110-Therapeutic exercises, 97530- Therapeutic activity, [id number]- Neuromuscular re-education, 97535- Self Care, [id number]- Manual therapy, [phone]- Gait training, [phone]- Aquatic Therapy, 97014- Electrical stimulation (unattended), [phone]- Ultrasound, Patient/Family education, Stair training, Taping, Dry Needling, DME instructions, Cryotherapy, and Moist heat   PLAN FOR NEXT SESSION:  Begin per hip labral protocol   Alm JINNY Don, PT 02/26/2024, 12:42 PM

## 2024-03-02 ENCOUNTER — Encounter (HOSPITAL_BASED_OUTPATIENT_CLINIC_OR_DEPARTMENT_OTHER): Admitting: Physical Therapy

## 2024-03-02 ENCOUNTER — Encounter (HOSPITAL_BASED_OUTPATIENT_CLINIC_OR_DEPARTMENT_OTHER): Payer: Self-pay | Admitting: Physical Therapy

## 2024-03-02 ENCOUNTER — Ambulatory Visit (HOSPITAL_BASED_OUTPATIENT_CLINIC_OR_DEPARTMENT_OTHER): Attending: Family Medicine | Admitting: Physical Therapy

## 2024-03-02 DIAGNOSIS — R29898 Other symptoms and signs involving the musculoskeletal system: Secondary | ICD-10-CM | POA: Diagnosis not present

## 2024-03-02 DIAGNOSIS — M25552 Pain in left hip: Secondary | ICD-10-CM | POA: Insufficient documentation

## 2024-03-02 DIAGNOSIS — R2689 Other abnormalities of gait and mobility: Secondary | ICD-10-CM | POA: Insufficient documentation

## 2024-03-02 NOTE — Therapy (Signed)
 OUTPATIENT PHYSICAL THERAPY LOWER EXTREMITY EVALUATION   Patient Name: Jasmine Conrad MRN: [id number] DOB:1985/09/30, 38 y.o., female Today's Date: 03/02/2024  END OF SESSION:  PT End of Session - 03/02/24 1231     Visit Number 2    Number of Visits 24    Date for PT Re-Evaluation 05/20/24    Authorization Type [employer]    PT Start Time 1146    PT Stop Time 1227    PT Time Calculation (min) 41 min    Activity Tolerance Patient tolerated treatment well;No increased pain    Behavior During Therapy WFL for tasks assessed/performed           Past Medical History:  Diagnosis Date   Depression    Fibroid    Medical history non-contributory    Past Surgical History:  Procedure Laterality Date   CESAREAN SECTION N/A 09/10/2017   Procedure: CESAREAN SECTION;  Surgeon: Dannielle Bouchard, DO;  Location: [facility];  Service: Obstetrics;  Laterality: N/A;  Primary edc 09/16/17 NKDA [name] RNFA   CESAREAN SECTION N/A 08/29/2019   Procedure: CESAREAN SECTION;  Surgeon: Dannielle Bouchard, DO;  Location: [facility];  Service: Obstetrics;  Laterality: N/A;  REPEAT EDC 09/08/19 NKDA [name],  RNFA   WISDOM TOOTH EXTRACTION     Patient Active Problem List   Diagnosis Date Noted   Tear of left acetabular labrum 02/23/2024   Previous cesarean section 08/29/2019   S/P cesarean section 09/10/2017   Trauma during pregnancy 07/03/2017   Left shoulder pain 04/01/2016    PCP: Arnett Repress MD   REFERRING PROVIDER:  Elspeth Parker MD  REFERRING DIAG:  Diagnosis  [id number] (ICD-10-CM) - Tear of left acetabular labrum, initial encounter    THERAPY DIAG:  Left hip pain  Weakness of left hip  Other abnormalities of gait and mobility  Rationale for Evaluation and Treatment: Rehabilitation  ONSET DATE: 02/23/2024 Days since surgery: 8    SUBJECTIVE:   SUBJECTIVE STATEMENT:  Pt reports walking around a little more over the weekend which made the hip more sore. She did feel some  pain with sleeping but does not remember the position- thinks it was rolling over.    Eval: She has a long history of left hip pain.  He trialed conservative therapy with no significant improvement in pain.  On 02/23/2024 she had a left hip labral repair.  Her pain is well-controlled at this time.  She is not using any assistive device.  PERTINENT HISTORY: Nothing pertinent PAIN:  Are you having pain? Yes: NPRS scale: 3 out of 10 consistently Pain location: Anterior hip Pain description: aching  Aggravating factors: prolonged positioning  Relieving factors: medication and ice   PRECAUTIONS: None  RED FLAGS: None   WEIGHT BEARING RESTRICTIONS: No  FALLS:  Has patient fallen in last 6 months? No  LIVING ENVIRONMENT: Has steps at her house   OCCUPATION:  Works for [employer].  Off this week but remote next week.   PLOF: Independent  PATIENT GOALS:    NEXT MD VISIT:  Next week   OBJECTIVE:  Note: Objective measures were completed at Evaluation unless otherwise noted.  DIAGNOSTIC FINDINGS:  Nothing post op   PATIENT SURVEYS:  LEFS  Extreme difficulty/unable (0), Quite a bit of difficulty (1), Moderate difficulty (2), Little difficulty (3), No difficulty (4) Survey date:    Any of your usual work, housework or school activities   2. Usual hobbies, recreational or sporting activities   3. Getting into/out  of the bath   4. Walking between rooms   5. Putting on socks/shoes   6. Squatting    7. Lifting an object, like a bag of groceries from the floor   8. Performing light activities around your home   9. Performing heavy activities around your home   10. Getting into/out of a car   11. Walking 2 blocks   12. Walking 1 mile   13. Going up/down 10 stairs (1 flight)   14. Standing for 1 hour   15.  sitting for 1 hour   16. Running on even ground   17. Running on uneven ground   18. Making sharp turns while running fast   19. Hopping    20. Rolling over in bed   Score  total:  14/80     COGNITION: Overall cognitive status: Within functional limits for tasks assessed     SENSATION: WFL   POSTURE: No Significant postural limitations  PALPATION: No unexpected TTP  LOWER EXTREMITY ROM:  Passive ROM Right eval Left eval  Hip flexion  90  Hip extension    Hip abduction    Hip adduction    Hip internal rotation    Hip external rotation  18  Knee flexion    Knee extension    Ankle dorsiflexion    Ankle plantarflexion    Ankle inversion    Ankle eversion     (Blank rows = not tested)  LOWER EXTREMITY MMT:  MMT Right eval Left eval  Hip flexion    Hip extension    Hip abduction    Hip adduction    Hip internal rotation    Hip external rotation    Knee flexion    Knee extension    Ankle dorsiflexion    Ankle plantarflexion    Ankle inversion    Ankle eversion     (Blank rows = not tested) not tested 2nd to recent surgery    GAIT: Mild lateral movement with gait Decreased left single leg stance time                                                                                                                               TREATMENT DATE:   7/2  Review of precautions Bandage change- no s/s of infection STM L quad PROM to tolerance and protocol limits (flexion 90, IR 10, ER 10, ABD 30)  S/L clam 2x10 PPT 20x Standing TKE GTB 2x10  Prone quad stretch 30s 3x    Eval:  Manual:  Reviewed self soft tissue mobilization   There-ex:  Quad set 2x10  Bent knee fallouts x12  Prone lying  Prone knee flexion x12        PATIENT EDUCATION:  Education details: HEP, symptom management  Person educated: Patient Education method: Explanation, Demonstration, Tactile cues, Verbal cues, and Handouts Education comprehension: verbalized understanding, returned demonstration, verbal cues required, tactile cues required, and needs further  education  HOME EXERCISE PROGRAM: Access Code: [email or web address] URL:  [email or web address] Date: 02/26/2024 Prepared by: Alm Don  Exercises - Bent Knee Fallouts  - 1 x daily - 7 x weekly - 3 sets - 10 reps - Prone Knee Flexion  - 1 x daily - 7 x weekly - 3 sets - 10 reps - Supine Quad Set  - 1 x daily - 7 x weekly - 3 sets - 10 reps  ASSESSMENT:  CLINICAL IMPRESSION: Pt 1 wk post op. Pt wound without s/s of infection with bandage change and precautions reviewed for first 3 wks of protocol. Pt with anterior thigh soft tissue restrictions as expected but able to reach protocol limits without pain. HEP updated today. Pt advised on maintaining a quiet hip at this time and to use the AD as necessary to avoid overuse and increased inflammatory response in the surgical site. Plan for pt to see MD tomorrow for f/u appt. Continue with protocol as tolerated. Pt would benefit from skilled therapy to improve ability to perform ADLs and return to active lifestyle. OBJECTIVE IMPAIRMENTS: Abnormal gait, decreased activity tolerance, difficulty walking, decreased ROM, decreased strength, and pain.   ACTIVITY LIMITATIONS: carrying, lifting, sitting, standing, squatting, sleeping, stairs, transfers, bed mobility, bathing, and dressing  PARTICIPATION LIMITATIONS: meal prep, cleaning, laundry, driving, shopping, community activity, and occupation  PERSONAL FACTORS: None   REHAB POTENTIAL: Excellent  CLINICAL DECISION MAKING: Stable/uncomplicated  EVALUATION COMPLEXITY: Low   GOALS: Goals reviewed with patient? Yes  SHORT TERM GOALS: Target date: 03/25/2024   Patient will increase passive right hip flexion 90 degrees Baseline: Goal status: INITIAL  2.  Patient will wean off assistive device as tolerated Baseline:  Goal status: INITIAL  3.  Patient will be independent with basic HEP Baseline:  Goal status: INITIAL   LONG TERM GOALS: Target date: 05/20/2024    Patient will go up and down 8 steps with reciprocal gait pattern Baseline:   Goal status: INITIAL  2.  Patient will ambulate community distances without increased pain Baseline:  Goal status: INITIAL  3.  Patient will stand for greater than 1 hour without increased pain in order to perform ADLs Baseline:  Goal status: INITIAL  4.  Patient will return to full exercise routine Baseline:  Goal status: INITIAL    PLAN:  PT FREQUENCY: 2x/week  PT DURATION: 12 weeks  PLANNED INTERVENTIONS: 97110-Therapeutic exercises, 97530- Therapeutic activity, [id number]- Neuromuscular re-education, 97535- Self Care, 02859- Manual therapy, [id number]- Gait training, [phone]- Aquatic Therapy, 97014- Electrical stimulation (unattended), 97035- Ultrasound, Patient/Family education, Stair training, Taping, Dry Needling, DME instructions, Cryotherapy, and Moist heat   PLAN FOR NEXT SESSION:  Begin per hip labral protocol   Dale Call, PT 03/02/2024, 12:40 PM

## 2024-03-03 ENCOUNTER — Ambulatory Visit (INDEPENDENT_AMBULATORY_CARE_PROVIDER_SITE_OTHER): Admitting: Orthopaedic Surgery

## 2024-03-03 DIAGNOSIS — S73192A Other sprain of left hip, initial encounter: Secondary | ICD-10-CM

## 2024-03-03 NOTE — Progress Notes (Signed)
 Post Operative Evaluation    Procedure/Date of Surgery: Left hip arthroscopy with labral repair 6/24  Interval History:   Presents 2 weeks status post the above procedure.  Overall she is doing extremely well.  She is now walking without the use of crutches although does use them occasionally for some assistance.  She has no pain just some soreness with longer distances   PMH/PSH/Family History/Social History/Meds/Allergies:    Past Medical History:  Diagnosis Date   Depression    Fibroid    Medical history non-contributory    Past Surgical History:  Procedure Laterality Date   CESAREAN SECTION N/A 09/10/2017   Procedure: CESAREAN SECTION;  Surgeon: Dannielle Bouchard, DO;  Location: [facility];  Service: Obstetrics;  Laterality: N/A;  Primary edc 09/16/17 NKDA [name] RNFA   CESAREAN SECTION N/A 08/29/2019   Procedure: CESAREAN SECTION;  Surgeon: Dannielle Bouchard, DO;  Location: [facility];  Service: Obstetrics;  Laterality: N/A;  REPEAT EDC 09/08/19 NKDA [name],  RNFA   WISDOM TOOTH EXTRACTION     Social History   Socioeconomic History   Marital status: Married    Spouse name: Not on file   Number of children: Not on file   Years of education: Not on file   Highest education level: Not on file  Occupational History   Not on file  Tobacco Use   Smoking status: Never   Smokeless tobacco: Never  Vaping Use   Vaping status: Never Used  Substance and Sexual Activity   Alcohol use: No    Comment: not since pregnancy   Drug use: No   Sexual activity: Not Currently    Comment: due to pain asssociated with intercourse   Other Topics Concern   Not on file  Social History Narrative   Not on file   Social Drivers of Health   Financial Resource Strain: Not on file  Food Insecurity: Not on file  Transportation Needs: Not on file  Physical Activity: Not on file  Stress: Not on file  Social Connections: Not on file   Family History   Problem Relation Age of Onset   Hypertension Paternal Grandmother    Cancer Paternal Grandmother    Cancer Paternal Grandfather    Cancer Maternal Grandmother    Kidney Stones Mother    Diabetes Paternal Uncle    Allergies  Allergen Reactions   Sulfamethoxazole -Trimethoprim  Rash   Current Outpatient Medications  Medication Sig Dispense Refill   aspirin  EC 325 MG tablet Take 1 tablet (325 mg total) by mouth daily. 14 tablet 0   LORazepam  (ATIVAN ) 0.5 MG tablet Take 1 tablet by mouth 30-60 minutes prior to procedure, can repeat dose once at time of procedure if needed. 2 tablet 0   metroNIDAZOLE  (METROGEL ) 0.75 % gel Apply 1 Application topically daily. 45 g 0   oxyCODONE  (ROXICODONE ) 5 MG immediate release tablet Take 1 tablet (5 mg total) by mouth every 4 (four) hours as needed for severe pain (pain score 7-10) or breakthrough pain. 10 tablet 0   No current facility-administered medications for this visit.   No results found.  Review of Systems:   A ROS was performed including pertinent positives and negatives as documented in the HPI.   Musculoskeletal Exam:      Left hip incisions are well-appearing without erythema  or drainage.  Walks with a normal gait.  30 degrees internal/external rotation without pain.  Good abduction strength  Imaging:      I personally reviewed and interpreted the radiographs.   Assessment:   2 weeks status post left hip arthroscopic labral repair doing well.  At this time she will continue to advance her weightbearing and progress strengthening.  I will plan to see her back in 4 weeks for reassessment  Plan :    - Return to clinic 4 weeks for reassessment      I personally saw and evaluated the patient, and participated in the management and treatment plan.  Elspeth Parker, MD Attending Physician, Orthopedic Surgery  This document was dictated using Dragon voice recognition software. A reasonable attempt at proof reading has been  made to minimize errors.

## 2024-03-07 ENCOUNTER — Encounter (HOSPITAL_BASED_OUTPATIENT_CLINIC_OR_DEPARTMENT_OTHER): Payer: Self-pay

## 2024-03-09 ENCOUNTER — Ambulatory Visit (HOSPITAL_BASED_OUTPATIENT_CLINIC_OR_DEPARTMENT_OTHER): Admitting: Physical Therapy

## 2024-03-09 ENCOUNTER — Encounter (HOSPITAL_BASED_OUTPATIENT_CLINIC_OR_DEPARTMENT_OTHER): Admitting: Physical Therapy

## 2024-03-09 DIAGNOSIS — R29898 Other symptoms and signs involving the musculoskeletal system: Secondary | ICD-10-CM | POA: Diagnosis not present

## 2024-03-09 DIAGNOSIS — R2689 Other abnormalities of gait and mobility: Secondary | ICD-10-CM | POA: Diagnosis not present

## 2024-03-09 DIAGNOSIS — M25552 Pain in left hip: Secondary | ICD-10-CM

## 2024-03-09 NOTE — Therapy (Unsigned)
 OUTPATIENT PHYSICAL THERAPY LOWER EXTREMITY EVALUATION   Patient Name: Jasmine Conrad MRN: [id number] DOB:Oct 21, 1985, 38 y.o., female Today's Date: 03/09/2024  END OF SESSION:     Past Medical History:  Diagnosis Date   Depression    Fibroid    Medical history non-contributory    Past Surgical History:  Procedure Laterality Date   CESAREAN SECTION N/A 09/10/2017   Procedure: CESAREAN SECTION;  Surgeon: Dannielle Bouchard, DO;  Location: [facility];  Service: Obstetrics;  Laterality: N/A;  Primary edc 09/16/17 NKDA [name] RNFA   CESAREAN SECTION N/A 08/29/2019   Procedure: CESAREAN SECTION;  Surgeon: Dannielle Bouchard, DO;  Location: [facility];  Service: Obstetrics;  Laterality: N/A;  REPEAT EDC 09/08/19 NKDA [name],  RNFA   WISDOM TOOTH EXTRACTION     Patient Active Problem List   Diagnosis Date Noted   Tear of left acetabular labrum 02/23/2024   Previous cesarean section 08/29/2019   S/P cesarean section 09/10/2017   Trauma during pregnancy 07/03/2017   Left shoulder pain 04/01/2016    PCP: Arnett Repress MD   REFERRING PROVIDER:  Elspeth Parker MD  REFERRING DIAG:  Diagnosis  [phone] (ICD-10-CM) - Tear of left acetabular labrum, initial encounter    THERAPY DIAG:  No diagnosis found.  Rationale for Evaluation and Treatment: Rehabilitation  ONSET DATE: 02/23/2024 Days since surgery: 15    SUBJECTIVE:   SUBJECTIVE STATEMENT:  Pt reports walking around a little more over the weekend which made the hip more sore. She did feel some pain with sleeping but does not remember the position- thinks it was rolling over.    Eval: She has a long history of left hip pain.  He trialed conservative therapy with no significant improvement in pain.  On 02/23/2024 she had a left hip labral repair.  Her pain is well-controlled at this time.  She is not using any assistive device.  PERTINENT HISTORY: Nothing pertinent PAIN:  Are you having pain? Yes: NPRS scale: 3 out  of 10 consistently Pain location: Anterior hip Pain description: aching  Aggravating factors: prolonged positioning  Relieving factors: medication and ice   PRECAUTIONS: None  RED FLAGS: None   WEIGHT BEARING RESTRICTIONS: No  FALLS:  Has patient fallen in last 6 months? No  LIVING ENVIRONMENT: Has steps at her house   OCCUPATION:  Works for [employer].  Off this week but remote next week.   PLOF: Independent  PATIENT GOALS:    NEXT MD VISIT:  Next week   OBJECTIVE:  Note: Objective measures were completed at Evaluation unless otherwise noted.  DIAGNOSTIC FINDINGS:  Nothing post op   PATIENT SURVEYS:  LEFS  Extreme difficulty/unable (0), Quite a bit of difficulty (1), Moderate difficulty (2), Little difficulty (3), No difficulty (4) Survey date:    Any of your usual work, housework or school activities   2. Usual hobbies, recreational or sporting activities   3. Getting into/out of the bath   4. Walking between rooms   5. Putting on socks/shoes   6. Squatting    7. Lifting an object, like a bag of groceries from the floor   8. Performing light activities around your home   9. Performing heavy activities around your home   10. Getting into/out of a car   11. Walking 2 blocks   12. Walking 1 mile   13. Going up/down 10 stairs (1 flight)   14. Standing for 1 hour   15.  sitting for 1 hour  16. Running on even ground   17. Running on uneven ground   18. Making sharp turns while running fast   19. Hopping    20. Rolling over in bed   Score total:  14/80     COGNITION: Overall cognitive status: Within functional limits for tasks assessed     SENSATION: WFL   POSTURE: No Significant postural limitations  PALPATION: No unexpected TTP  LOWER EXTREMITY ROM:  Passive ROM Right eval Left eval  Hip flexion  90  Hip extension    Hip abduction    Hip adduction    Hip internal rotation    Hip external rotation  18  Knee flexion    Knee extension     Ankle dorsiflexion    Ankle plantarflexion    Ankle inversion    Ankle eversion     (Blank rows = not tested)  LOWER EXTREMITY MMT:  MMT Right eval Left eval  Hip flexion    Hip extension    Hip abduction    Hip adduction    Hip internal rotation    Hip external rotation    Knee flexion    Knee extension    Ankle dorsiflexion    Ankle plantarflexion    Ankle inversion    Ankle eversion     (Blank rows = not tested) not tested 2nd to recent surgery    GAIT: Mild lateral movement with gait Decreased left single leg stance time                                                                                                                               TREATMENT DATE:  7/3  There-ex:  SAQ 3x10  Bent knee fallouts 2x15  SL clam 2x10    Neuro re-ed:  Bridging 2x10 Quadruped rock 2x10   7/2  Review of precautions Bandage change- no s/s of infection STM L quad PROM to tolerance and protocol limits (flexion 90, IR 10, ER 10, ABD 30)  S/L clam 2x10 PPT 20x Standing TKE GTB 2x10  Prone quad stretch 30s 3x    Eval:  Manual:  Reviewed self soft tissue mobilization   There-ex:  Quad set 2x10  Bent knee fallouts x12  Prone lying  Prone knee flexion x12        PATIENT EDUCATION:  Education details: HEP, symptom management  Person educated: Patient Education method: Explanation, Demonstration, Tactile cues, Verbal cues, and Handouts Education comprehension: verbalized understanding, returned demonstration, verbal cues required, tactile cues required, and needs further education  HOME EXERCISE PROGRAM: Access Code: [email or web address] URL: [email or web address] Date: 02/26/2024 Prepared by: Alm Don  Exercises - Bent Knee Fallouts  - 1 x daily - 7 x weekly - 3 sets - 10 reps - Prone Knee Flexion  - 1 x daily - 7 x weekly - 3 sets - 10 reps - Supine Quad Set  - 1 x daily - 7 x weekly - 3  sets - 10 reps  ASSESSMENT:  CLINICAL  IMPRESSION: Pt 1 wk post op. Pt wound without s/s of infection with bandage change and precautions reviewed for first 3 wks of protocol. Pt with anterior thigh soft tissue restrictions as expected but able to reach protocol limits without pain. HEP updated today. Pt advised on maintaining a quiet hip at this time and to use the AD as necessary to avoid overuse and increased inflammatory response in the surgical site. Plan for pt to see MD tomorrow for f/u appt. Continue with protocol as tolerated. Pt would benefit from skilled therapy to improve ability to perform ADLs and return to active lifestyle. OBJECTIVE IMPAIRMENTS: Abnormal gait, decreased activity tolerance, difficulty walking, decreased ROM, decreased strength, and pain.   ACTIVITY LIMITATIONS: carrying, lifting, sitting, standing, squatting, sleeping, stairs, transfers, bed mobility, bathing, and dressing  PARTICIPATION LIMITATIONS: meal prep, cleaning, laundry, driving, shopping, community activity, and occupation  PERSONAL FACTORS: None   REHAB POTENTIAL: Excellent  CLINICAL DECISION MAKING: Stable/uncomplicated  EVALUATION COMPLEXITY: Low   GOALS: Goals reviewed with patient? Yes  SHORT TERM GOALS: Target date: 03/25/2024   Patient will increase passive right hip flexion 90 degrees Baseline: Goal status: INITIAL  2.  Patient will wean off assistive device as tolerated Baseline:  Goal status: INITIAL  3.  Patient will be independent with basic HEP Baseline:  Goal status: INITIAL   LONG TERM GOALS: Target date: 05/20/2024    Patient will go up and down 8 steps with reciprocal gait pattern Baseline:  Goal status: INITIAL  2.  Patient will ambulate community distances without increased pain Baseline:  Goal status: INITIAL  3.  Patient will stand for greater than 1 hour without increased pain in order to perform ADLs Baseline:  Goal status: INITIAL  4.  Patient will return to full exercise  routine Baseline:  Goal status: INITIAL    PLAN:  PT FREQUENCY: 2x/week  PT DURATION: 12 weeks  PLANNED INTERVENTIONS: 97110-Therapeutic exercises, 97530- Therapeutic activity, [id number]- Neuromuscular re-education, 97535- Self Care, 02859- Manual therapy, [phone]- Gait training, [phone]- Aquatic Therapy, 97014- Electrical stimulation (unattended), [phone]- Ultrasound, Patient/Family education, Stair training, Taping, Dry Needling, DME instructions, Cryotherapy, and Moist heat   PLAN FOR NEXT SESSION:  Begin per hip labral protocol   Alm JINNY Don, PT 03/09/2024, 12:10 PM

## 2024-03-10 ENCOUNTER — Encounter (HOSPITAL_BASED_OUTPATIENT_CLINIC_OR_DEPARTMENT_OTHER): Payer: Self-pay | Admitting: Physical Therapy

## 2024-03-16 ENCOUNTER — Ambulatory Visit (HOSPITAL_BASED_OUTPATIENT_CLINIC_OR_DEPARTMENT_OTHER): Admitting: Physical Therapy

## 2024-03-16 ENCOUNTER — Encounter (HOSPITAL_BASED_OUTPATIENT_CLINIC_OR_DEPARTMENT_OTHER): Admitting: Physical Therapy

## 2024-03-16 ENCOUNTER — Encounter (HOSPITAL_BASED_OUTPATIENT_CLINIC_OR_DEPARTMENT_OTHER): Payer: Self-pay | Admitting: Physical Therapy

## 2024-03-16 DIAGNOSIS — R29898 Other symptoms and signs involving the musculoskeletal system: Secondary | ICD-10-CM

## 2024-03-16 DIAGNOSIS — R2689 Other abnormalities of gait and mobility: Secondary | ICD-10-CM

## 2024-03-16 DIAGNOSIS — M25552 Pain in left hip: Secondary | ICD-10-CM

## 2024-03-16 NOTE — Therapy (Signed)
 OUTPATIENT PHYSICAL THERAPY LOWER EXTREMITY EVALUATION   Patient Name: Jasmine Conrad MRN: [id number] DOB:1986-03-23, 38 y.o., female Today's Date: 03/16/2024  END OF SESSION:  PT End of Session - 03/16/24 0948     Visit Number 4    Number of Visits 24    Date for PT Re-Evaluation 05/20/24    Authorization Type [employer]    PT Start Time [phone]    PT Stop Time 1015    PT Time Calculation (min) 41 min    Activity Tolerance Patient tolerated treatment well;No increased pain    Behavior During Therapy WFL for tasks assessed/performed            Past Medical History:  Diagnosis Date   Depression    Fibroid    Medical history non-contributory    Past Surgical History:  Procedure Laterality Date   CESAREAN SECTION N/A 09/10/2017   Procedure: CESAREAN SECTION;  Surgeon: Dannielle Bouchard, DO;  Location: [facility];  Service: Obstetrics;  Laterality: N/A;  Primary edc 09/16/17 NKDA [name] RNFA   CESAREAN SECTION N/A 08/29/2019   Procedure: CESAREAN SECTION;  Surgeon: Dannielle Bouchard, DO;  Location: [facility];  Service: Obstetrics;  Laterality: N/A;  REPEAT EDC 09/08/19 NKDA [name],  RNFA   WISDOM TOOTH EXTRACTION     Patient Active Problem List   Diagnosis Date Noted   Tear of left acetabular labrum 02/23/2024   Previous cesarean section 08/29/2019   S/P cesarean section 09/10/2017   Trauma during pregnancy 07/03/2017   Left shoulder pain 04/01/2016    PCP: Arnett Repress MD   REFERRING PROVIDER:  Elspeth Parker MD  REFERRING DIAG:  Diagnosis  [phone] (ICD-10-CM) - Tear of left acetabular labrum, initial encounter    THERAPY DIAG:  Left hip pain  Weakness of left hip  Other abnormalities of gait and mobility  Rationale for Evaluation and Treatment: Rehabilitation  ONSET DATE: 02/23/2024 Days since surgery: 22    SUBJECTIVE:   SUBJECTIVE STATEMENT:  Patient has had minor groin soreness. Overall she is doing well. She feels like she is walking  better.   Eval: She has a long history of left hip pain.  He trialed conservative therapy with no significant improvement in pain.  On 02/23/2024 she had a left hip labral repair.  Her pain is well-controlled at this time.  She is not using any assistive device.  PERTINENT HISTORY: Nothing pertinent PAIN:  Are you having pain? Yes: NPRS scale: 3 out of 10 consistently Pain location: Anterior hip Pain description: aching  Aggravating factors: prolonged positioning  Relieving factors: medication and ice   PRECAUTIONS: None  RED FLAGS: None   WEIGHT BEARING RESTRICTIONS: No  FALLS:  Has patient fallen in last 6 months? No  LIVING ENVIRONMENT: Has steps at her house   OCCUPATION:  Works for [employer].  Off this week but remote next week.   PLOF: Independent  PATIENT GOALS:    NEXT MD VISIT:  Next week   OBJECTIVE:  Note: Objective measures were completed at Evaluation unless otherwise noted.  DIAGNOSTIC FINDINGS:  Nothing post op   PATIENT SURVEYS:  LEFS  Extreme difficulty/unable (0), Quite a bit of difficulty (1), Moderate difficulty (2), Little difficulty (3), No difficulty (4) Survey date:    Any of your usual work, housework or school activities   2. Usual hobbies, recreational or sporting activities   3. Getting into/out of the bath   4. Walking between rooms   5. Putting on socks/shoes  6. Squatting    7. Lifting an object, like a bag of groceries from the floor   8. Performing light activities around your home   9. Performing heavy activities around your home   10. Getting into/out of a car   11. Walking 2 blocks   12. Walking 1 mile   13. Going up/down 10 stairs (1 flight)   14. Standing for 1 hour   15.  sitting for 1 hour   16. Running on even ground   17. Running on uneven ground   18. Making sharp turns while running fast   19. Hopping    20. Rolling over in bed   Score total:  14/80     COGNITION: Overall cognitive status: Within functional  limits for tasks assessed     SENSATION: WFL   POSTURE: No Significant postural limitations  PALPATION: No unexpected TTP  LOWER EXTREMITY ROM:  Passive ROM Right eval Left eval  Hip flexion  90  Hip extension    Hip abduction    Hip adduction    Hip internal rotation    Hip external rotation  18  Knee flexion    Knee extension    Ankle dorsiflexion    Ankle plantarflexion    Ankle inversion    Ankle eversion     (Blank rows = not tested)  LOWER EXTREMITY MMT:  MMT Right eval Left eval  Hip flexion    Hip extension    Hip abduction    Hip adduction    Hip internal rotation    Hip external rotation    Knee flexion    Knee extension    Ankle dorsiflexion    Ankle plantarflexion    Ankle inversion    Ankle eversion     (Blank rows = not tested) not tested 2nd to recent surgery    GAIT: Mild lateral movement with gait Decreased left single leg stance time                                                                                                                               TREATMENT DATE:  7/16 Manual:  Roller to anterior quad   There-ex:  SAQ 1x12 no weight  2x12 1.5 lbs  Upright bike 5 min   Bent knee fallouts 2x15   PROM into all planes   Prone hip extension 3x10   Neuro-re-ed:  Quadruped alt ue 2x10  Quadruped alt LE 2x10     7/3  Manual:  Roller to anterior quad   There-ex:  SAQ 3x10  Bent knee fallouts 2x15  SL clam 2x10  PROM into all planes    Neuro re-ed:  Bridging 2x10 Quadruped rock 2x10   7/2  Review of precautions Bandage change- no s/s of infection STM L quad PROM to tolerance and protocol limits (flexion 90, IR 10, ER 10, ABD 30)  S/L clam 2x10 PPT 20x Standing TKE GTB 2x10  Prone quad stretch 30s 3x    Eval:  Manual:  Reviewed self soft tissue mobilization   There-ex:  Quad set 2x10  Bent knee fallouts x12  Prone lying  Prone knee flexion x12        PATIENT EDUCATION:  Education  details: HEP, symptom management  Person educated: Patient Education method: Explanation, Demonstration, Tactile cues, Verbal cues, and Handouts Education comprehension: verbalized understanding, returned demonstration, verbal cues required, tactile cues required, and needs further education  HOME EXERCISE PROGRAM: Access Code: [email or web address] URL: [email or web address] Date: 02/26/2024 Prepared by: Alm Don  Exercises - Bent Knee Fallouts  - 1 x daily - 7 x weekly - 3 sets - 10 reps - Prone Knee Flexion  - 1 x daily - 7 x weekly - 3 sets - 10 reps - Supine Quad Set  - 1 x daily - 7 x weekly - 3 sets - 10 reps  ASSESSMENT:  CLINICAL IMPRESSION: The patient is progressing well.  Her hip flexion is approaching end range without reaching end feel or pain.  Her hip external rotation is progressing well.  We were able to progress her exercises today.  She had no significant increase in pain.  She was given updated HEP.  Therapy will continue to progress as tolerated.  OBJECTIVE IMPAIRMENTS: Abnormal gait, decreased activity tolerance, difficulty walking, decreased ROM, decreased strength, and pain.   ACTIVITY LIMITATIONS: carrying, lifting, sitting, standing, squatting, sleeping, stairs, transfers, bed mobility, bathing, and dressing  PARTICIPATION LIMITATIONS: meal prep, cleaning, laundry, driving, shopping, community activity, and occupation  PERSONAL FACTORS: None   REHAB POTENTIAL: Excellent  CLINICAL DECISION MAKING: Stable/uncomplicated  EVALUATION COMPLEXITY: Low   GOALS: Goals reviewed with patient? Yes  SHORT TERM GOALS: Target date: 03/25/2024   Patient will increase passive right hip flexion 90 degrees Baseline: Goal status: INITIAL  2.  Patient will wean off assistive device as tolerated Baseline:  Goal status: INITIAL  3.  Patient will be independent with basic HEP Baseline:  Goal status: INITIAL   LONG TERM GOALS: Target date:  05/20/2024    Patient will go up and down 8 steps with reciprocal gait pattern Baseline:  Goal status: INITIAL  2.  Patient will ambulate community distances without increased pain Baseline:  Goal status: INITIAL  3.  Patient will stand for greater than 1 hour without increased pain in order to perform ADLs Baseline:  Goal status: INITIAL  4.  Patient will return to full exercise routine Baseline:  Goal status: INITIAL    PLAN:  PT FREQUENCY: 2x/week  PT DURATION: 12 weeks  PLANNED INTERVENTIONS: 97110-Therapeutic exercises, 97530- Therapeutic activity, [id number]- Neuromuscular re-education, 97535- Self Care, 02859- Manual therapy, [phone]- Gait training, [phone]- Aquatic Therapy, 97014- Electrical stimulation (unattended), 97035- Ultrasound, Patient/Family education, Stair training, Taping, Dry Needling, DME instructions, Cryotherapy, and Moist heat   PLAN FOR NEXT SESSION:  Begin per hip labral protocol   Alm JINNY Don, PT 03/16/2024, 9:56 AM

## 2024-03-23 ENCOUNTER — Encounter (HOSPITAL_BASED_OUTPATIENT_CLINIC_OR_DEPARTMENT_OTHER): Admitting: Physical Therapy

## 2024-03-23 ENCOUNTER — Ambulatory Visit (HOSPITAL_BASED_OUTPATIENT_CLINIC_OR_DEPARTMENT_OTHER): Admitting: Physical Therapy

## 2024-03-23 ENCOUNTER — Encounter (HOSPITAL_BASED_OUTPATIENT_CLINIC_OR_DEPARTMENT_OTHER): Payer: Self-pay | Admitting: Physical Therapy

## 2024-03-23 DIAGNOSIS — R2689 Other abnormalities of gait and mobility: Secondary | ICD-10-CM | POA: Diagnosis not present

## 2024-03-23 DIAGNOSIS — M25552 Pain in left hip: Secondary | ICD-10-CM | POA: Diagnosis not present

## 2024-03-23 DIAGNOSIS — R29898 Other symptoms and signs involving the musculoskeletal system: Secondary | ICD-10-CM | POA: Diagnosis not present

## 2024-03-23 NOTE — Therapy (Signed)
 OUTPATIENT PHYSICAL THERAPY LOWER EXTREMITY EVALUATION   Patient Name: Jasmine Conrad MRN: [id number] DOB:March 29, 1986, 38 y.o., female Today's Date: 03/23/2024  END OF SESSION:  PT End of Session - 03/23/24 0945     Visit Number 5    Number of Visits 24    Date for PT Re-Evaluation 05/20/24    Authorization Type [employer]    PT Start Time 0930    PT Stop Time 1012    PT Time Calculation (min) 42 min    Activity Tolerance Patient tolerated treatment well;No increased pain    Behavior During Therapy WFL for tasks assessed/performed             Past Medical History:  Diagnosis Date   Depression    Fibroid    Medical history non-contributory    Past Surgical History:  Procedure Laterality Date   CESAREAN SECTION N/A 09/10/2017   Procedure: CESAREAN SECTION;  Surgeon: Dannielle Bouchard, DO;  Location: [facility];  Service: Obstetrics;  Laterality: N/A;  Primary edc 09/16/17 NKDA [name] RNFA   CESAREAN SECTION N/A 08/29/2019   Procedure: CESAREAN SECTION;  Surgeon: Dannielle Bouchard, DO;  Location: [facility];  Service: Obstetrics;  Laterality: N/A;  REPEAT EDC 09/08/19 NKDA [name],  RNFA   WISDOM TOOTH EXTRACTION     Patient Active Problem List   Diagnosis Date Noted   Tear of left acetabular labrum 02/23/2024   Previous cesarean section 08/29/2019   S/P cesarean section 09/10/2017   Trauma during pregnancy 07/03/2017   Left shoulder pain 04/01/2016    PCP: Arnett Repress MD   REFERRING PROVIDER:  Elspeth Parker MD  REFERRING DIAG:  Diagnosis  [phone] (ICD-10-CM) - Tear of left acetabular labrum, initial encounter    THERAPY DIAG:  Left hip pain  Weakness of left hip  Other abnormalities of gait and mobility  Rationale for Evaluation and Treatment: Rehabilitation  ONSET DATE: 02/23/2024 Days since surgery: 29    SUBJECTIVE:   SUBJECTIVE STATEMENT:  Patient has had minor groin soreness. Overall she is doing well. She feels like she is walking  better.   Eval: She has a long history of left hip pain.  He trialed conservative therapy with no significant improvement in pain.  On 02/23/2024 she had a left hip labral repair.  Her pain is well-controlled at this time.  She is not using any assistive device.  PERTINENT HISTORY: Nothing pertinent PAIN:  Are you having pain? Yes: NPRS scale: 3 out of 10 consistently Pain location: Anterior hip Pain description: aching  Aggravating factors: prolonged positioning  Relieving factors: medication and ice   PRECAUTIONS: None  RED FLAGS: None   WEIGHT BEARING RESTRICTIONS: No  FALLS:  Has patient fallen in last 6 months? No  LIVING ENVIRONMENT: Has steps at her house   OCCUPATION:  Works for [employer].  Off this week but remote next week.   PLOF: Independent  PATIENT GOALS:    NEXT MD VISIT:  Next week   OBJECTIVE:  Note: Objective measures were completed at Evaluation unless otherwise noted.  DIAGNOSTIC FINDINGS:  Nothing post op   PATIENT SURVEYS:  LEFS  Extreme difficulty/unable (0), Quite a bit of difficulty (1), Moderate difficulty (2), Little difficulty (3), No difficulty (4) Survey date:    Any of your usual work, housework or school activities   2. Usual hobbies, recreational or sporting activities   3. Getting into/out of the bath   4. Walking between rooms   5. Putting on socks/shoes  6. Squatting    7. Lifting an object, like a bag of groceries from the floor   8. Performing light activities around your home   9. Performing heavy activities around your home   10. Getting into/out of a car   11. Walking 2 blocks   12. Walking 1 mile   13. Going up/down 10 stairs (1 flight)   14. Standing for 1 hour   15.  sitting for 1 hour   16. Running on even ground   17. Running on uneven ground   18. Making sharp turns while running fast   19. Hopping    20. Rolling over in bed   Score total:  14/80     COGNITION: Overall cognitive status: Within functional  limits for tasks assessed     SENSATION: WFL   POSTURE: No Significant postural limitations  PALPATION: No unexpected TTP  LOWER EXTREMITY ROM:  Passive ROM Right eval Left eval  Hip flexion  90  Hip extension    Hip abduction    Hip adduction    Hip internal rotation    Hip external rotation  18  Knee flexion    Knee extension    Ankle dorsiflexion    Ankle plantarflexion    Ankle inversion    Ankle eversion     (Blank rows = not tested)  LOWER EXTREMITY MMT:  MMT Right eval Left eval  Hip flexion    Hip extension    Hip abduction    Hip adduction    Hip internal rotation    Hip external rotation    Knee flexion    Knee extension    Ankle dorsiflexion    Ankle plantarflexion    Ankle inversion    Ankle eversion     (Blank rows = not tested) not tested 2nd to recent surgery    GAIT: Mild lateral movement with gait Decreased left single leg stance time                                                                                                                               TREATMENT DATE:  7/23 Manual:  Roller to anterior quad   There-ex:  Upright bike 5 min   Bent knee fallouts 2x15   PROM into all planes   Prone hip extension 3x10   LAQ x12 no weight  2x12 1.5 lbs   Neuro-re-ed:  Side lying clams 3x10  Bridge 3x12   Standing slow march 2x10  Standing heel/toe 2x15      7/16 Manual:  Roller to anterior quad     There-ex:  SAQ 1x12 no weight  2x12 1.5 lbs  Upright bike 5 min   Bent knee fallouts 2x15   PROM into all planes   Prone hip extension 3x10   Neuro-re-ed:  Quadruped alt ue 2x10  Quadruped alt LE 2x10     7/3  Manual:  Roller to anterior quad  There-ex:  SAQ 3x10  Bent knee fallouts 2x15  SL clam 2x10  PROM into all planes    Neuro re-ed:  Bridging 2x10 Quadruped rock 2x10   7/2  Review of precautions Bandage change- no s/s of infection STM L quad PROM to tolerance and protocol  limits (flexion 90, IR 10, ER 10, ABD 30)  S/L clam 2x10 PPT 20x Standing TKE GTB 2x10  Prone quad stretch 30s 3x    Eval:  Manual:  Reviewed self soft tissue mobilization   There-ex:  Quad set 2x10  Bent knee fallouts x12  Prone lying  Prone knee flexion x12        PATIENT EDUCATION:  Education details: HEP, symptom management  Person educated: Patient Education method: Explanation, Demonstration, Tactile cues, Verbal cues, and Handouts Education comprehension: verbalized understanding, returned demonstration, verbal cues required, tactile cues required, and needs further education  HOME EXERCISE PROGRAM: Access Code: [email or web address] URL: [email or web address] Date: 02/26/2024 Prepared by: Alm Don  Exercises - Bent Knee Fallouts  - 1 x daily - 7 x weekly - 3 sets - 10 reps - Prone Knee Flexion  - 1 x daily - 7 x weekly - 3 sets - 10 reps - Supine Quad Set  - 1 x daily - 7 x weekly - 3 sets - 10 reps  ASSESSMENT:  CLINICAL IMPRESSION: The patient had mild pain in her anterior hip coming in today. She had tightness in the front of her hip which improved significantly with manual therapy. She is making good progress. If she tolerates weight bearing well we will progress into functional exercises next week.  OBJECTIVE IMPAIRMENTS: Abnormal gait, decreased activity tolerance, difficulty walking, decreased ROM, decreased strength, and pain.   ACTIVITY LIMITATIONS: carrying, lifting, sitting, standing, squatting, sleeping, stairs, transfers, bed mobility, bathing, and dressing  PARTICIPATION LIMITATIONS: meal prep, cleaning, laundry, driving, shopping, community activity, and occupation  PERSONAL FACTORS: None   REHAB POTENTIAL: Excellent  CLINICAL DECISION MAKING: Stable/uncomplicated  EVALUATION COMPLEXITY: Low   GOALS: Goals reviewed with patient? Yes  SHORT TERM GOALS: Target date: 03/25/2024   Patient will increase passive right hip flexion  90 degrees Baseline: Goal status: achieved 7/23  2.  Patient will wean off assistive device as tolerated Baseline:  Goal status: achieved 7/23  3.  Patient will be independent with basic HEP Baseline:  Goal status: INITIAL   LONG TERM GOALS: Target date: 05/20/2024    Patient will go up and down 8 steps with reciprocal gait pattern Baseline:  Goal status: INITIAL  2.  Patient will ambulate community distances without increased pain Baseline:  Goal status: INITIAL  3.  Patient will stand for greater than 1 hour without increased pain in order to perform ADLs Baseline:  Goal status: INITIAL  4.  Patient will return to full exercise routine Baseline:  Goal status: INITIAL    PLAN:  PT FREQUENCY: 2x/week  PT DURATION: 12 weeks  PLANNED INTERVENTIONS: 97110-Therapeutic exercises, 97530- Therapeutic activity, [id number]- Neuromuscular re-education, 97535- Self Care, [id number]- Manual therapy, [phone]- Gait training, [phone]- Aquatic Therapy, 97014- Electrical stimulation (unattended), [phone]- Ultrasound, Patient/Family education, Stair training, Taping, Dry Needling, DME instructions, Cryotherapy, and Moist heat   PLAN FOR NEXT SESSION:  Begin per hip labral protocol   Alm JINNY Don, PT 03/23/2024, 2:55 PM

## 2024-03-24 ENCOUNTER — Other Ambulatory Visit: Payer: Self-pay | Admitting: Medical Genetics

## 2024-03-25 ENCOUNTER — Ambulatory Visit (HOSPITAL_BASED_OUTPATIENT_CLINIC_OR_DEPARTMENT_OTHER): Admitting: Physical Therapy

## 2024-03-25 ENCOUNTER — Encounter (HOSPITAL_BASED_OUTPATIENT_CLINIC_OR_DEPARTMENT_OTHER): Admitting: Physical Therapy

## 2024-03-25 DIAGNOSIS — R29898 Other symptoms and signs involving the musculoskeletal system: Secondary | ICD-10-CM | POA: Diagnosis not present

## 2024-03-25 DIAGNOSIS — R2689 Other abnormalities of gait and mobility: Secondary | ICD-10-CM

## 2024-03-25 DIAGNOSIS — M25552 Pain in left hip: Secondary | ICD-10-CM

## 2024-03-25 NOTE — Therapy (Signed)
 OUTPATIENT PHYSICAL THERAPY LOWER EXTREMITY EVALUATION   Patient Name: Jasmine Conrad MRN: [id number] DOB:12/30/1985, 38 y.o., female Today's Date: 03/25/2024  END OF SESSION:  PT End of Session - 03/25/24 1053     Visit Number 6    Number of Visits 24    Date for PT Re-Evaluation 05/20/24    Authorization Type [employer]    PT Start Time 0930    PT Stop Time 1013    PT Time Calculation (min) 43 min    Activity Tolerance Patient tolerated treatment well;No increased pain    Behavior During Therapy WFL for tasks assessed/performed              Past Medical History:  Diagnosis Date   Depression    Fibroid    Medical history non-contributory    Past Surgical History:  Procedure Laterality Date   CESAREAN SECTION N/A 09/10/2017   Procedure: CESAREAN SECTION;  Surgeon: Dannielle Bouchard, DO;  Location: [facility];  Service: Obstetrics;  Laterality: N/A;  Primary edc 09/16/17 NKDA [name] RNFA   CESAREAN SECTION N/A 08/29/2019   Procedure: CESAREAN SECTION;  Surgeon: Dannielle Bouchard, DO;  Location: [facility];  Service: Obstetrics;  Laterality: N/A;  REPEAT EDC 09/08/19 NKDA [name],  RNFA   WISDOM TOOTH EXTRACTION     Patient Active Problem List   Diagnosis Date Noted   Tear of left acetabular labrum 02/23/2024   Previous cesarean section 08/29/2019   S/P cesarean section 09/10/2017   Trauma during pregnancy 07/03/2017   Left shoulder pain 04/01/2016    PCP: Arnett Repress MD   REFERRING PROVIDER:  Elspeth Parker MD  REFERRING DIAG:  Diagnosis  [phone] (ICD-10-CM) - Tear of left acetabular labrum, initial encounter    THERAPY DIAG:  Left hip pain  Weakness of left hip  Other abnormalities of gait and mobility  Rationale for Evaluation and Treatment: Rehabilitation  ONSET DATE: 02/23/2024 Days since surgery: 31    SUBJECTIVE:   SUBJECTIVE STATEMENT:  The patient became very sore yesterday. She had to leave work to ice it. She feels better today  but it is still sharp and crampy in the front and deep. She doesn't remember doing anything in particular. She sat on al ow chair for a while, and also woke up in pain> She may have slept funny.    Eval: She has a long history of left hip pain.  He trialed conservative therapy with no significant improvement in pain.  On 02/23/2024 she had a left hip labral repair.  Her pain is well-controlled at this time.  She is not using any assistive device.  PERTINENT HISTORY: Nothing pertinent PAIN:  Are you having pain? Yes: NPRS scale: 4 out of 10 consistently Pain location: Anterior hip Pain description: aching  Aggravating factors: prolonged positioning  Relieving factors: medication and ice   PRECAUTIONS: None  RED FLAGS: None   WEIGHT BEARING RESTRICTIONS: No  FALLS:  Has patient fallen in last 6 months? No  LIVING ENVIRONMENT: Has steps at her house   OCCUPATION:  Works for [employer].  Off this week but remote next week.   PLOF: Independent  PATIENT GOALS:    NEXT MD VISIT:  Next week   OBJECTIVE:  Note: Objective measures were completed at Evaluation unless otherwise noted.  DIAGNOSTIC FINDINGS:  Nothing post op   PATIENT SURVEYS:  LEFS  Extreme difficulty/unable (0), Quite a bit of difficulty (1), Moderate difficulty (2), Little difficulty (3), No difficulty (4) Survey date:  Any of your usual work, housework or school activities   2. Usual hobbies, recreational or sporting activities   3. Getting into/out of the bath   4. Walking between rooms   5. Putting on socks/shoes   6. Squatting    7. Lifting an object, like a bag of groceries from the floor   8. Performing light activities around your home   9. Performing heavy activities around your home   10. Getting into/out of a car   11. Walking 2 blocks   12. Walking 1 mile   13. Going up/down 10 stairs (1 flight)   14. Standing for 1 hour   15.  sitting for 1 hour   16. Running on even ground   17. Running  on uneven ground   18. Making sharp turns while running fast   19. Hopping    20. Rolling over in bed   Score total:  14/80     COGNITION: Overall cognitive status: Within functional limits for tasks assessed     SENSATION: WFL   POSTURE: No Significant postural limitations  PALPATION: No unexpected TTP  LOWER EXTREMITY ROM:  Passive ROM Right eval Left eval  Hip flexion  90  Hip extension    Hip abduction    Hip adduction    Hip internal rotation    Hip external rotation  18  Knee flexion    Knee extension    Ankle dorsiflexion    Ankle plantarflexion    Ankle inversion    Ankle eversion     (Blank rows = not tested)  LOWER EXTREMITY MMT:  MMT Right eval Left eval  Hip flexion    Hip extension    Hip abduction    Hip adduction    Hip internal rotation    Hip external rotation    Knee flexion    Knee extension    Ankle dorsiflexion    Ankle plantarflexion    Ankle inversion    Ankle eversion     (Blank rows = not tested) not tested 2nd to recent surgery    GAIT: Mild lateral movement with gait Decreased left single leg stance time                                                                                                                               TREATMENT DATE:  7/25 Manual: Grade I inferior glides  Trigger point release to QL, gluteal, and anterior Review of self soft tissue mobilization using thera-cane  There-ex:  PPT 2x15  Prone knee flexion 2x12  Prone ER/IR 2x12      7/23 Manual:  Roller to anterior quad   There-ex:  Upright bike 5 min   Bent knee fallouts 2x15   PROM into all planes   Prone hip extension 3x10   LAQ x12 no weight  2x12 1.5 lbs   Neuro-re-ed:  Side lying clams 3x10  Bridge 3x12  Standing slow march 2x10  Standing heel/toe 2x15      7/16 Manual:  Roller to anterior quad     There-ex:  SAQ 1x12 no weight  2x12 1.5 lbs  Upright bike 5 min   Bent knee fallouts 2x15   PROM into  all planes   Prone hip extension 3x10   Neuro-re-ed:  Quadruped alt ue 2x10  Quadruped alt LE 2x10     7/3  Manual:  Roller to anterior quad   There-ex:  SAQ 3x10  Bent knee fallouts 2x15  SL clam 2x10  PROM into all planes    Neuro re-ed:  Bridging 2x10 Quadruped rock 2x10   7/2  Review of precautions Bandage change- no s/s of infection STM L quad PROM to tolerance and protocol limits (flexion 90, IR 10, ER 10, ABD 30)  S/L clam 2x10 PPT 20x Standing TKE GTB 2x10  Prone quad stretch 30s 3x    Eval:  Manual:  Reviewed self soft tissue mobilization   There-ex:  Quad set 2x10  Bent knee fallouts x12  Prone lying  Prone knee flexion x12        PATIENT EDUCATION:  Education details: HEP, symptom management  Person educated: Patient Education method: Explanation, Demonstration, Tactile cues, Verbal cues, and Handouts Education comprehension: verbalized understanding, returned demonstration, verbal cues required, tactile cues required, and needs further education  HOME EXERCISE PROGRAM: Access Code: [email or web address] URL: [email or web address] Date: 02/26/2024 Prepared by: Alm Don  Exercises - Bent Knee Fallouts  - 1 x daily - 7 x weekly - 3 sets - 10 reps - Prone Knee Flexion  - 1 x daily - 7 x weekly - 3 sets - 10 reps - Supine Quad Set  - 1 x daily - 7 x weekly - 3 sets - 10 reps  ASSESSMENT:  CLINICAL IMPRESSION:  The patient was limited today. Sh ehad pain wth flexion and ER pre treatment. She had a significant improvement with annual therapy. She had a large spasm in her QL and anterior hip. We reviewed self soft tissue  mobilization for home. She was shown how to use a thera-cane. We worked on base exercises.     OBJECTIVE IMPAIRMENTS: Abnormal gait, decreased activity tolerance, difficulty walking, decreased ROM, decreased strength, and pain.   ACTIVITY LIMITATIONS: carrying, lifting, sitting, standing, squatting, sleeping,  stairs, transfers, bed mobility, bathing, and dressing  PARTICIPATION LIMITATIONS: meal prep, cleaning, laundry, driving, shopping, community activity, and occupation  PERSONAL FACTORS: None   REHAB POTENTIAL: Excellent  CLINICAL DECISION MAKING: Stable/uncomplicated  EVALUATION COMPLEXITY: Low   GOALS: Goals reviewed with patient? Yes  SHORT TERM GOALS: Target date: 03/25/2024   Patient will increase passive right hip flexion 90 degrees Baseline: Goal status: achieved 7/23  2.  Patient will wean off assistive device as tolerated Baseline:  Goal status: achieved 7/23  3.  Patient will be independent with basic HEP Baseline:  Goal status: INITIAL   LONG TERM GOALS: Target date: 05/20/2024    Patient will go up and down 8 steps with reciprocal gait pattern Baseline:  Goal status: INITIAL  2.  Patient will ambulate community distances without increased pain Baseline:  Goal status: INITIAL  3.  Patient will stand for greater than 1 hour without increased pain in order to perform ADLs Baseline:  Goal status: INITIAL  4.  Patient will return to full exercise routine Baseline:  Goal status: INITIAL    PLAN:  PT FREQUENCY: 2x/week  PT DURATION: 12 weeks  PLANNED INTERVENTIONS: 97110-Therapeutic exercises, 97530- Therapeutic activity, [id number]- Neuromuscular re-education, [phone]- Self Care, 02859- Manual therapy, [phone]- Gait training, [phone]- Aquatic Therapy, 97014- Electrical stimulation (unattended), [phone]- Ultrasound, Patient/Family education, Stair training, Taping, Dry Needling, DME instructions, Cryotherapy, and Moist heat   PLAN FOR NEXT SESSION:  Begin per hip labral protocol   Alm JINNY Don, PT 03/25/2024, 10:53 AM

## 2024-03-30 ENCOUNTER — Encounter (HOSPITAL_BASED_OUTPATIENT_CLINIC_OR_DEPARTMENT_OTHER): Payer: Self-pay | Admitting: Physical Therapy

## 2024-03-30 ENCOUNTER — Encounter (HOSPITAL_BASED_OUTPATIENT_CLINIC_OR_DEPARTMENT_OTHER): Admitting: Physical Therapy

## 2024-03-30 ENCOUNTER — Ambulatory Visit (HOSPITAL_BASED_OUTPATIENT_CLINIC_OR_DEPARTMENT_OTHER): Admitting: Physical Therapy

## 2024-03-30 DIAGNOSIS — M25552 Pain in left hip: Secondary | ICD-10-CM

## 2024-03-30 DIAGNOSIS — R29898 Other symptoms and signs involving the musculoskeletal system: Secondary | ICD-10-CM

## 2024-03-30 DIAGNOSIS — R2689 Other abnormalities of gait and mobility: Secondary | ICD-10-CM | POA: Diagnosis not present

## 2024-03-30 NOTE — Therapy (Signed)
 OUTPATIENT PHYSICAL THERAPY LOWER EXTREMITY EVALUATION   Patient Name: Jasmine Conrad MRN: [id number] DOB:04-11-1986, 38 y.o., female Today's Date: 03/30/2024  END OF SESSION:  PT End of Session - 03/30/24 0928     Visit Number 7    Number of Visits 24    Date for PT Re-Evaluation 05/20/24    Authorization Type [employer]    PT Start Time 0930    PT Stop Time 1010    PT Time Calculation (min) 40 min    Activity Tolerance Patient tolerated treatment well;No increased pain    Behavior During Therapy WFL for tasks assessed/performed              Past Medical History:  Diagnosis Date   Depression    Fibroid    Medical history non-contributory    Past Surgical History:  Procedure Laterality Date   CESAREAN SECTION N/A 09/10/2017   Procedure: CESAREAN SECTION;  Surgeon: Dannielle Bouchard, DO;  Location: [facility];  Service: Obstetrics;  Laterality: N/A;  Primary edc 09/16/17 NKDA [name] RNFA   CESAREAN SECTION N/A 08/29/2019   Procedure: CESAREAN SECTION;  Surgeon: Dannielle Bouchard, DO;  Location: [facility];  Service: Obstetrics;  Laterality: N/A;  REPEAT EDC 09/08/19 NKDA [name],  RNFA   WISDOM TOOTH EXTRACTION     Patient Active Problem List   Diagnosis Date Noted   Tear of left acetabular labrum 02/23/2024   Previous cesarean section 08/29/2019   S/P cesarean section 09/10/2017   Trauma during pregnancy 07/03/2017   Left shoulder pain 04/01/2016    PCP: Arnett Repress MD   REFERRING PROVIDER:  Elspeth Parker MD  REFERRING DIAG:  Diagnosis  [phone] (ICD-10-CM) - Tear of left acetabular labrum, initial encounter    THERAPY DIAG:  Left hip pain  Weakness of left hip  Other abnormalities of gait and mobility  Rationale for Evaluation and Treatment: Rehabilitation  ONSET DATE: 02/23/2024 Days since surgery: 36    SUBJECTIVE:   SUBJECTIVE STATEMENT:  Pt reports hip feels better after doing self STM. Can go down stair normally now.     Eval: She has a long history of left hip pain.  He trialed conservative therapy with no significant improvement in pain.  On 02/23/2024 she had a left hip labral repair.  Her pain is well-controlled at this time.  She is not using any assistive device.  PERTINENT HISTORY: Nothing pertinent PAIN:  Are you having pain? Yes: NPRS scale: 1 out of 10 consistently Pain location: Anterior hip Pain description: aching  Aggravating factors: prolonged positioning  Relieving factors: medication and ice   PRECAUTIONS: None  RED FLAGS: None   WEIGHT BEARING RESTRICTIONS: No  FALLS:  Has patient fallen in last 6 months? No  LIVING ENVIRONMENT: Has steps at her house   OCCUPATION:  Works for [employer].  Off this week but remote next week.   PLOF: Independent  PATIENT GOALS:    NEXT MD VISIT:  Next week   OBJECTIVE:  Note: Objective measures were completed at Evaluation unless otherwise noted.  DIAGNOSTIC FINDINGS:  Nothing post op   PATIENT SURVEYS:  LEFS  Extreme difficulty/unable (0), Quite a bit of difficulty (1), Moderate difficulty (2), Little difficulty (3), No difficulty (4) Survey date:    Any of your usual work, housework or school activities   2. Usual hobbies, recreational or sporting activities   3. Getting into/out of the bath   4. Walking between rooms   5. Putting on socks/shoes  6. Squatting    7. Lifting an object, like a bag of groceries from the floor   8. Performing light activities around your home   9. Performing heavy activities around your home   10. Getting into/out of a car   11. Walking 2 blocks   12. Walking 1 mile   13. Going up/down 10 stairs (1 flight)   14. Standing for 1 hour   15.  sitting for 1 hour   16. Running on even ground   17. Running on uneven ground   18. Making sharp turns while running fast   19. Hopping    20. Rolling over in bed   Score total:  14/80     COGNITION: Overall cognitive status: Within functional  limits for tasks assessed     SENSATION: WFL   POSTURE: No Significant postural limitations  PALPATION: No unexpected TTP  LOWER EXTREMITY ROM:  Passive ROM Right eval Left eval  Hip flexion  90  Hip extension    Hip abduction    Hip adduction    Hip internal rotation    Hip external rotation  18  Knee flexion    Knee extension    Ankle dorsiflexion    Ankle plantarflexion    Ankle inversion    Ankle eversion     (Blank rows = not tested)  LOWER EXTREMITY MMT:  MMT Right eval Left eval  Hip flexion    Hip extension    Hip abduction    Hip adduction    Hip internal rotation    Hip external rotation    Knee flexion    Knee extension    Ankle dorsiflexion    Ankle plantarflexion    Ankle inversion    Ankle eversion     (Blank rows = not tested) not tested 2nd to recent surgery    GAIT: Mild lateral movement with gait Decreased left single leg stance time                                                                                                                               TREATMENT DATE:   7/30 Manual traction/LAD Mulligan lateral grade III and inf grade III  Thomas stretch EOB 30s 3x  Supine groin butterfly 30s 3x  Staggered bridge 3x10 Weighted SB 5lbs 3x10  STS 3x10 (mid thigh height, focus on hip ext/hinge)  Quadruped hip ext 3x10  Bike for cardio, increasing daily step count (avg 7k, can go up to 8k)    7/25 Manual: Grade I inferior glides  Trigger point release to QL, gluteal, and anterior Review of self soft tissue mobilization using thera-cane  There-ex:  PPT 2x15  Prone knee flexion 2x12  Prone ER/IR 2x12      7/23 Manual:  Roller to anterior quad   There-ex:  Upright bike 5 min   Bent knee fallouts 2x15   PROM into all planes   Prone hip  extension 3x10   LAQ x12 no weight  2x12 1.5 lbs   Neuro-re-ed:  Side lying clams 3x10  Bridge 3x12   Standing slow march 2x10  Standing heel/toe 2x15       7/16 Manual:  Roller to anterior quad     There-ex:  SAQ 1x12 no weight  2x12 1.5 lbs  Upright bike 5 min   Bent knee fallouts 2x15   PROM into all planes   Prone hip extension 3x10   Neuro-re-ed:  Quadruped alt ue 2x10  Quadruped alt LE 2x10     7/3  Manual:  Roller to anterior quad   There-ex:  SAQ 3x10  Bent knee fallouts 2x15  SL clam 2x10  PROM into all planes    Neuro re-ed:  Bridging 2x10 Quadruped rock 2x10   7/2  Review of precautions Bandage change- no s/s of infection STM L quad PROM to tolerance and protocol limits (flexion 90, IR 10, ER 10, ABD 30)  S/L clam 2x10 PPT 20x Standing TKE GTB 2x10  Prone quad stretch 30s 3x    Eval:  Manual:  Reviewed self soft tissue mobilization   There-ex:  Quad set 2x10  Bent knee fallouts x12  Prone lying  Prone knee flexion x12        PATIENT EDUCATION:  Education details: HEP, symptom management  Person educated: Patient Education method: Explanation, Demonstration, Tactile cues, Verbal cues, and Handouts Education comprehension: verbalized understanding, returned demonstration, verbal cues required, tactile cues required, and needs further education  HOME EXERCISE PROGRAM: Access Code: [email or web address] URL: [email or web address] Date: 02/26/2024 Prepared by: Alm Don  Exercises - Bent Knee Fallouts  - 1 x daily - 7 x weekly - 3 sets - 10 reps - Prone Knee Flexion  - 1 x daily - 7 x weekly - 3 sets - 10 reps - Supine Quad Set  - 1 x daily - 7 x weekly - 3 sets - 10 reps  ASSESSMENT:  CLINICAL IMPRESSION:     Pt 5 wks at this time and progressing well per protocol. Pt able to start CKC exercise and progress hip ROM exercise. No pain noted during session. HEP fully updated with new exercise. Pt advised on progressive stretching within her tolerance and to avoid excessive guarding. Pt may start progressive walking for exercise as well. Plan to continue with ROM,  manual therapy PRN, and progressive hip activation in all planes. Pt would benefit from continued skilled therapy in order to reach goals and maximize functional L LE strength and ROM for return to PLOF and exercise.    OBJECTIVE IMPAIRMENTS: Abnormal gait, decreased activity tolerance, difficulty walking, decreased ROM, decreased strength, and pain.   ACTIVITY LIMITATIONS: carrying, lifting, sitting, standing, squatting, sleeping, stairs, transfers, bed mobility, bathing, and dressing  PARTICIPATION LIMITATIONS: meal prep, cleaning, laundry, driving, shopping, community activity, and occupation  PERSONAL FACTORS: None   REHAB POTENTIAL: Excellent  CLINICAL DECISION MAKING: Stable/uncomplicated  EVALUATION COMPLEXITY: Low   GOALS: Goals reviewed with patient? Yes  SHORT TERM GOALS: Target date: 03/25/2024   Patient will increase passive right hip flexion 90 degrees Baseline: Goal status: achieved 7/23  2.  Patient will wean off assistive device as tolerated Baseline:  Goal status: achieved 7/23  3.  Patient will be independent with basic HEP Baseline:  Goal status: INITIAL   LONG TERM GOALS: Target date: 05/20/2024    Patient will go up and down 8 steps with reciprocal gait pattern Baseline:  Goal status: INITIAL  2.  Patient will ambulate community distances without increased pain Baseline:  Goal status: INITIAL  3.  Patient will stand for greater than 1 hour without increased pain in order to perform ADLs Baseline:  Goal status: INITIAL  4.  Patient will return to full exercise routine Baseline:  Goal status: INITIAL    PLAN:  PT FREQUENCY: 2x/week  PT DURATION: 12 weeks  PLANNED INTERVENTIONS: 97110-Therapeutic exercises, 97530- Therapeutic activity, [id number]- Neuromuscular re-education, 97535- Self Care, [id number]- Manual therapy, [id number]- Gait training, [phone]- Aquatic Therapy, [id number]- Electrical stimulation (unattended), 97035- Ultrasound, Patient/Family  education, Stair training, Taping, Dry Needling, DME instructions, Cryotherapy, and Moist heat   PLAN FOR NEXT SESSION:  Begin per hip labral protocol   Dale Call, PT 03/30/2024, 10:13 AM

## 2024-04-01 ENCOUNTER — Ambulatory Visit (HOSPITAL_BASED_OUTPATIENT_CLINIC_OR_DEPARTMENT_OTHER): Attending: Family Medicine | Admitting: Physical Therapy

## 2024-04-01 ENCOUNTER — Encounter (HOSPITAL_BASED_OUTPATIENT_CLINIC_OR_DEPARTMENT_OTHER): Payer: Self-pay | Admitting: Physical Therapy

## 2024-04-01 ENCOUNTER — Encounter (HOSPITAL_BASED_OUTPATIENT_CLINIC_OR_DEPARTMENT_OTHER): Admitting: Physical Therapy

## 2024-04-01 DIAGNOSIS — R2689 Other abnormalities of gait and mobility: Secondary | ICD-10-CM | POA: Diagnosis not present

## 2024-04-01 DIAGNOSIS — R29898 Other symptoms and signs involving the musculoskeletal system: Secondary | ICD-10-CM | POA: Diagnosis not present

## 2024-04-01 DIAGNOSIS — M25552 Pain in left hip: Secondary | ICD-10-CM | POA: Insufficient documentation

## 2024-04-01 NOTE — Therapy (Signed)
 OUTPATIENT PHYSICAL THERAPY LOWER EXTREMITY EVALUATION   Patient Name: Jasmine Conrad MRN: [id number] DOB:[birth year], 38 y.o., female Today's Date: 04/01/2024  END OF SESSION:  PT End of Session - 04/01/24 1308     Visit Number 8    Number of Visits 24    Date for PT Re-Evaluation 05/20/24    Authorization Type [employer]    PT Start Time 0930    PT Stop Time 1012    PT Time Calculation (min) 42 min    Activity Tolerance Patient tolerated treatment well;No increased pain    Behavior During Therapy WFL for tasks assessed/performed               Past Medical History:  Diagnosis Date   Depression    Fibroid    Medical history non-contributory    Past Surgical History:  Procedure Laterality Date   CESAREAN SECTION N/A 09/10/2017   Procedure: CESAREAN SECTION;  Surgeon: Dannielle Bouchard, DO;  Location: [facility];  Service: Obstetrics;  Laterality: N/A;  Primary edc 09/16/17 NKDA [name] RNFA   CESAREAN SECTION N/A 08/29/2019   Procedure: CESAREAN SECTION;  Surgeon: Dannielle Bouchard, DO;  Location: [facility];  Service: Obstetrics;  Laterality: N/A;  REPEAT EDC 09/08/19 NKDA [name],  RNFA   WISDOM TOOTH EXTRACTION     Patient Active Problem List   Diagnosis Date Noted   Tear of left acetabular labrum 02/23/2024   Previous cesarean section 08/29/2019   S/P cesarean section 09/10/2017   Trauma during pregnancy 07/03/2017   Left shoulder pain 04/01/2016    PCP: Arnett Repress MD   REFERRING PROVIDER:  Elspeth Parker MD  REFERRING DIAG:  Diagnosis  [phone] (ICD-10-CM) - Tear of left acetabular labrum, initial encounter    THERAPY DIAG:  Left hip pain  Weakness of left hip  Other abnormalities of gait and mobility  Rationale for Evaluation and Treatment: Rehabilitation  ONSET DATE: 02/23/2024 Days since surgery: 38    SUBJECTIVE:   SUBJECTIVE STATEMENT: The patient reports her hip is a little tight today. She reports overall it feels  good   Eval: She has a long history of left hip pain.  He trialed conservative therapy with no significant improvement in pain.  On 02/23/2024 she had a left hip labral repair.  Her pain is well-controlled at this time.  She is not using any assistive device.  PERTINENT HISTORY: Nothing pertinent PAIN:  Are you having pain? Yes: NPRS scale: 1 out of 10 consistently Pain location: Anterior hip Pain description: aching  Aggravating factors: prolonged positioning  Relieving factors: medication and ice   PRECAUTIONS: None  RED FLAGS: None   WEIGHT BEARING RESTRICTIONS: No  FALLS:  Has patient fallen in last 6 months? No  LIVING ENVIRONMENT: Has steps at her house   OCCUPATION:  Works for [employer].  Off this week but remote next week.   PLOF: Independent  PATIENT GOALS:    NEXT MD VISIT:  Next week   OBJECTIVE:  Note: Objective measures were completed at Evaluation unless otherwise noted.  DIAGNOSTIC FINDINGS:  Nothing post op   PATIENT SURVEYS:  LEFS  Extreme difficulty/unable (0), Quite a bit of difficulty (1), Moderate difficulty (2), Little difficulty (3), No difficulty (4) Survey date:    Any of your usual work, housework or school activities   2. Usual hobbies, recreational or sporting activities   3. Getting into/out of the bath   4. Walking between rooms   5. Putting on socks/shoes  6. Squatting    7. Lifting an object, like a bag of groceries from the floor   8. Performing light activities around your home   9. Performing heavy activities around your home   10. Getting into/out of a car   11. Walking 2 blocks   12. Walking 1 mile   13. Going up/down 10 stairs (1 flight)   14. Standing for 1 hour   15.  sitting for 1 hour   16. Running on even ground   17. Running on uneven ground   18. Making sharp turns while running fast   19. Hopping    20. Rolling over in bed   Score total:  14/80     COGNITION: Overall cognitive status: Within functional  limits for tasks assessed     SENSATION: WFL   POSTURE: No Significant postural limitations  PALPATION: No unexpected TTP  LOWER EXTREMITY ROM:  Passive ROM Right eval Left eval  Hip flexion  90  Hip extension    Hip abduction    Hip adduction    Hip internal rotation    Hip external rotation  18  Knee flexion    Knee extension    Ankle dorsiflexion    Ankle plantarflexion    Ankle inversion    Ankle eversion     (Blank rows = not tested)  LOWER EXTREMITY MMT:  MMT Right eval Left eval  Hip flexion    Hip extension    Hip abduction    Hip adduction    Hip internal rotation    Hip external rotation    Knee flexion    Knee extension    Ankle dorsiflexion    Ankle plantarflexion    Ankle inversion    Ankle eversion     (Blank rows = not tested) not tested 2nd to recent surgery    GAIT: Mild lateral movement with gait Decreased left single leg stance time                                                                                                                               TREATMENT DATE:  8/1 Manual traction/LAD Posterior  grade III and inf grade III  Neuro-re-ed:  Side lying clams 3x10  Bridge 3x12   There-ex:  Prone hip extension 3x10  Ex bike L4    There-act:  Step up 3x10 2 inch  Lateral step up 2inch 3x10      7/30 Manual traction/LAD Mulligan lateral grade III and inf grade III  Thomas stretch EOB 30s 3x  Supine groin butterfly 30s 3x  Staggered bridge 3x10 Weighted SB 5lbs 3x10  STS 3x10 (mid thigh height, focus on hip ext/hinge)  Quadruped hip ext 3x10  Bike for cardio, increasing daily step count (avg 7k, can go up to 8k)    7/25 Manual: Grade I inferior glides  Trigger point release to QL, gluteal, and anterior Review of self soft tissue  mobilization using thera-cane  There-ex:  PPT 2x15  Prone knee flexion 2x12  Prone ER/IR 2x12      7/23 Manual:  Roller to anterior quad   There-ex:  Upright bike  5 min   Bent knee fallouts 2x15   PROM into all planes   Prone hip extension 3x10   LAQ x12 no weight  2x12 1.5 lbs   Neuro-re-ed:  Side lying clams 3x10  Bridge 3x12   Standing slow march 2x10  Standing heel/toe 2x15      7/16 Manual:  Roller to anterior quad     There-ex:  SAQ 1x12 no weight  2x12 1.5 lbs  Upright bike 5 min   Bent knee fallouts 2x15   PROM into all planes   Prone hip extension 3x10   Neuro-re-ed:  Quadruped alt ue 2x10  Quadruped alt LE 2x10     7/3  Manual:  Roller to anterior quad   There-ex:  SAQ 3x10  Bent knee fallouts 2x15  SL clam 2x10  PROM into all planes    Neuro re-ed:  Bridging 2x10 Quadruped rock 2x10   7/2  Review of precautions Bandage change- no s/s of infection STM L quad PROM to tolerance and protocol limits (flexion 90, IR 10, ER 10, ABD 30)  S/L clam 2x10 PPT 20x Standing TKE GTB 2x10  Prone quad stretch 30s 3x    Eval:  Manual:  Reviewed self soft tissue mobilization   There-ex:  Quad set 2x10  Bent knee fallouts x12  Prone lying  Prone knee flexion x12        PATIENT EDUCATION:  Education details: HEP, symptom management  Person educated: Patient Education method: Explanation, Demonstration, Tactile cues, Verbal cues, and Handouts Education comprehension: verbalized understanding, returned demonstration, verbal cues required, tactile cues required, and needs further education  HOME EXERCISE PROGRAM: Access Code: [email or web address] URL: [email or web address] Date: 02/26/2024 Prepared by: Alm Don  Exercises - Bent Knee Fallouts  - 1 x daily - 7 x weekly - 3 sets - 10 reps - Prone Knee Flexion  - 1 x daily - 7 x weekly - 3 sets - 10 reps - Supine Quad Set  - 1 x daily - 7 x weekly - 3 sets - 10 reps  ASSESSMENT:  CLINICAL IMPRESSION:    The patient cam in with tightness in her groin. Therapy performed manual therapy and the tightness in her groin improved. We  began stair training today. She tolerated well. She felt like a 2 inch was low on the RPE scale. We will increase if she responds well. She did well with her squats today. Therapy will continue to progress as tolerated.    OBJECTIVE IMPAIRMENTS: Abnormal gait, decreased activity tolerance, difficulty walking, decreased ROM, decreased strength, and pain.   ACTIVITY LIMITATIONS: carrying, lifting, sitting, standing, squatting, sleeping, stairs, transfers, bed mobility, bathing, and dressing  PARTICIPATION LIMITATIONS: meal prep, cleaning, laundry, driving, shopping, community activity, and occupation  PERSONAL FACTORS: None   REHAB POTENTIAL: Excellent  CLINICAL DECISION MAKING: Stable/uncomplicated  EVALUATION COMPLEXITY: Low   GOALS: Goals reviewed with patient? Yes  SHORT TERM GOALS: Target date: 03/25/2024   Patient will increase passive right hip flexion 90 degrees Baseline: Goal status: achieved 7/23  2.  Patient will wean off assistive device as tolerated Baseline:  Goal status: achieved 7/23  3.  Patient will be independent with basic HEP Baseline:  Goal status: INITIAL   LONG TERM GOALS: Target date: 05/20/2024    Patient  will go up and down 8 steps with reciprocal gait pattern Baseline:  Goal status: INITIAL  2.  Patient will ambulate community distances without increased pain Baseline:  Goal status: INITIAL  3.  Patient will stand for greater than 1 hour without increased pain in order to perform ADLs Baseline:  Goal status: INITIAL  4.  Patient will return to full exercise routine Baseline:  Goal status: INITIAL    PLAN:  PT FREQUENCY: 2x/week  PT DURATION: 12 weeks  PLANNED INTERVENTIONS: 97110-Therapeutic exercises, 97530- Therapeutic activity, [id number]- Neuromuscular re-education, 97535- Self Care, [id number]- Manual therapy, [id number]- Gait training, [phone]- Aquatic Therapy, 97014- Electrical stimulation (unattended), 97035- Ultrasound, Patient/Family  education, Stair training, Taping, Dry Needling, DME instructions, Cryotherapy, and Moist heat   PLAN FOR NEXT SESSION:  Begin per hip labral protocol   Alm JINNY Don, PT 04/01/2024, 1:12 PM

## 2024-04-04 ENCOUNTER — Encounter (HOSPITAL_BASED_OUTPATIENT_CLINIC_OR_DEPARTMENT_OTHER): Payer: Self-pay | Admitting: Physical Therapy

## 2024-04-04 ENCOUNTER — Ambulatory Visit (HOSPITAL_BASED_OUTPATIENT_CLINIC_OR_DEPARTMENT_OTHER): Admitting: Physical Therapy

## 2024-04-04 DIAGNOSIS — M25552 Pain in left hip: Secondary | ICD-10-CM | POA: Diagnosis not present

## 2024-04-04 DIAGNOSIS — R29898 Other symptoms and signs involving the musculoskeletal system: Secondary | ICD-10-CM

## 2024-04-04 DIAGNOSIS — R2689 Other abnormalities of gait and mobility: Secondary | ICD-10-CM

## 2024-04-04 NOTE — Therapy (Signed)
 OUTPATIENT PHYSICAL THERAPY LOWER EXTREMITY EVALUATION   Patient Name: Jasmine Conrad MRN: [id number] DOB:10-12-1985, 38 y.o., female Today's Date: 04/04/2024  END OF SESSION:  PT End of Session - 04/04/24 1216     Visit Number 9    Number of Visits 24    Date for PT Re-Evaluation 05/20/24    Authorization Type [employer]    PT Start Time 1149    PT Stop Time 1231    PT Time Calculation (min) 42 min    Activity Tolerance Patient tolerated treatment well;No increased pain    Behavior During Therapy WFL for tasks assessed/performed                Past Medical History:  Diagnosis Date   Depression    Fibroid    Medical history non-contributory    Past Surgical History:  Procedure Laterality Date   CESAREAN SECTION N/A 09/10/2017   Procedure: CESAREAN SECTION;  Surgeon: Dannielle Bouchard, DO;  Location: [facility];  Service: Obstetrics;  Laterality: N/A;  Primary edc 09/16/17 NKDA [name] RNFA   CESAREAN SECTION N/A 08/29/2019   Procedure: CESAREAN SECTION;  Surgeon: Dannielle Bouchard, DO;  Location: [facility];  Service: Obstetrics;  Laterality: N/A;  REPEAT EDC 09/08/19 NKDA [name],  RNFA   WISDOM TOOTH EXTRACTION     Patient Active Problem List   Diagnosis Date Noted   Tear of left acetabular labrum 02/23/2024   Previous cesarean section 08/29/2019   S/P cesarean section 09/10/2017   Trauma during pregnancy 07/03/2017   Left shoulder pain 04/01/2016    PCP: Arnett Repress MD   REFERRING PROVIDER:  Elspeth Parker MD  REFERRING DIAG:  Diagnosis  [phone] (ICD-10-CM) - Tear of left acetabular labrum, initial encounter    THERAPY DIAG:  Left hip pain  Weakness of left hip  Other abnormalities of gait and mobility  Rationale for Evaluation and Treatment: Rehabilitation  ONSET DATE: 02/23/2024 Days since surgery: 41    SUBJECTIVE:   SUBJECTIVE STATEMENT: Pt reports going on a longer walk with the dog that was roughly a mile with more hills. Hip is  sore but not painful.    Eval: She has a long history of left hip pain.  He trialed conservative therapy with no significant improvement in pain.  On 02/23/2024 she had a left hip labral repair.  Her pain is well-controlled at this time.  She is not using any assistive device.  PERTINENT HISTORY: Nothing pertinent PAIN:  Are you having pain? Yes: NPRS scale: 1 out of 10 consistently Pain location: Anterior hip Pain description: aching  Aggravating factors: prolonged positioning  Relieving factors: medication and ice   PRECAUTIONS: None  RED FLAGS: None   WEIGHT BEARING RESTRICTIONS: No  FALLS:  Has patient fallen in last 6 months? No  LIVING ENVIRONMENT: Has steps at her house   OCCUPATION:  Works for [employer].  Off this week but remote next week.   PLOF: Independent  PATIENT GOALS:    NEXT MD VISIT:  Next week   OBJECTIVE:  Note: Objective measures were completed at Evaluation unless otherwise noted.  DIAGNOSTIC FINDINGS:  Nothing post op   PATIENT SURVEYS:  LEFS  Extreme difficulty/unable (0), Quite a bit of difficulty (1), Moderate difficulty (2), Little difficulty (3), No difficulty (4) Survey date:    Any of your usual work, housework or school activities   2. Usual hobbies, recreational or sporting activities   3. Getting into/out of the bath   4.  Walking between rooms   5. Putting on socks/shoes   6. Squatting    7. Lifting an object, like a bag of groceries from the floor   8. Performing light activities around your home   9. Performing heavy activities around your home   10. Getting into/out of a car   11. Walking 2 blocks   12. Walking 1 mile   13. Going up/down 10 stairs (1 flight)   14. Standing for 1 hour   15.  sitting for 1 hour   16. Running on even ground   17. Running on uneven ground   18. Making sharp turns while running fast   19. Hopping    20. Rolling over in bed   Score total:  14/80     COGNITION: Overall cognitive status:  Within functional limits for tasks assessed     SENSATION: WFL   POSTURE: No Significant postural limitations  PALPATION: No unexpected TTP  LOWER EXTREMITY ROM:  Passive ROM Right eval Left eval  Hip flexion  90  Hip extension    Hip abduction    Hip adduction    Hip internal rotation    Hip external rotation  18  Knee flexion    Knee extension    Ankle dorsiflexion    Ankle plantarflexion    Ankle inversion    Ankle eversion     (Blank rows = not tested)  LOWER EXTREMITY MMT:  MMT Right eval Left eval  Hip flexion    Hip extension    Hip abduction    Hip adduction    Hip internal rotation    Hip external rotation    Knee flexion    Knee extension    Ankle dorsiflexion    Ankle plantarflexion    Ankle inversion    Ankle eversion     (Blank rows = not tested) not tested 2nd to recent surgery    GAIT: Mild lateral movement with gait Decreased left single leg stance time                                                                                                                               TREATMENT DATE:  8/4  Recumbent bike warm up 5 min 2.5   Manual traction/LAD Mulligan lateral grade III and inf grade III Prone L hip PA grade III PROM prone hip IR/ER   Supine figure 4 slide 15x Supine 90/90 hip rotation 2x10 6 box step up 2x10 SLS 30s 3x    8/1 Manual traction/LAD Posterior  grade III and inf grade III  Neuro-re-ed:  Side lying clams 3x10  Bridge 3x12   There-ex:  Prone hip extension 3x10  Ex bike L4    There-act:  Step up 3x10 2 inch  Lateral step up 2inch 3x10      7/30 Manual traction/LAD Mulligan lateral grade III and inf grade III  Thomas stretch EOB 30s 3x  Supine  groin butterfly 30s 3x  Staggered bridge 3x10 Weighted SB 5lbs 3x10  STS 3x10 (mid thigh height, focus on hip ext/hinge)  Quadruped hip ext 3x10  Bike for cardio, increasing daily step count (avg 7k, can go up to 8k)    7/25 Manual: Grade  I inferior glides  Trigger point release to QL, gluteal, and anterior Review of self soft tissue mobilization using thera-cane  There-ex:  PPT 2x15  Prone knee flexion 2x12  Prone ER/IR 2x12      7/23 Manual:  Roller to anterior quad   There-ex:  Upright bike 5 min   Bent knee fallouts 2x15   PROM into all planes   Prone hip extension 3x10   LAQ x12 no weight  2x12 1.5 lbs   Neuro-re-ed:  Side lying clams 3x10  Bridge 3x12   Standing slow march 2x10  Standing heel/toe 2x15       PATIENT EDUCATION:  Education details: HEP, symptom management  Person educated: Patient Education method: Explanation, Demonstration, Tactile cues, Verbal cues, and Handouts Education comprehension: verbalized understanding, returned demonstration, verbal cues required, tactile cues required, and needs further education  HOME EXERCISE PROGRAM: Access Code: [email or web address] URL: [email or web address] Date: 02/26/2024 Prepared by: Alm Don  Exercises - Bent Knee Fallouts  - 1 x daily - 7 x weekly - 3 sets - 10 reps - Prone Knee Flexion  - 1 x daily - 7 x weekly - 3 sets - 10 reps - Supine Quad Set  - 1 x daily - 7 x weekly - 3 sets - 10 reps  ASSESSMENT:  CLINICAL IMPRESSION:    Pt nearly 6 wks post op at this time. Pt's L hip is very stiff in to extension and rotation but pain free with PROM and manual. Pt responded well to joint mobs post extended walk and was able to progress ROM for HEP. ER in hip neutral very stiff into medial hip and groin. Pt step up progressed but not given for home just yet. SLS stance progressed with good stability. Consider progression of SLS on pliant surface, step ups for HEP, and addition of sidestepping. Plan to progress as per protocol to address continued impairment and deficits post-op.    OBJECTIVE IMPAIRMENTS: Abnormal gait, decreased activity tolerance, difficulty walking, decreased ROM, decreased strength, and pain.   ACTIVITY  LIMITATIONS: carrying, lifting, sitting, standing, squatting, sleeping, stairs, transfers, bed mobility, bathing, and dressing  PARTICIPATION LIMITATIONS: meal prep, cleaning, laundry, driving, shopping, community activity, and occupation  PERSONAL FACTORS: None   REHAB POTENTIAL: Excellent  CLINICAL DECISION MAKING: Stable/uncomplicated  EVALUATION COMPLEXITY: Low   GOALS: Goals reviewed with patient? Yes  SHORT TERM GOALS: Target date: 03/25/2024   Patient will increase passive right hip flexion 90 degrees Baseline: Goal status: achieved 7/23  2.  Patient will wean off assistive device as tolerated Baseline:  Goal status: achieved 7/23  3.  Patient will be independent with basic HEP Baseline:  Goal status: INITIAL   LONG TERM GOALS: Target date: 05/20/2024    Patient will go up and down 8 steps with reciprocal gait pattern Baseline:  Goal status: INITIAL  2.  Patient will ambulate community distances without increased pain Baseline:  Goal status: INITIAL  3.  Patient will stand for greater than 1 hour without increased pain in order to perform ADLs Baseline:  Goal status: INITIAL  4.  Patient will return to full exercise routine Baseline:  Goal status: INITIAL    PLAN:  PT FREQUENCY: 2x/week  PT DURATION: 12 weeks  PLANNED INTERVENTIONS: 97110-Therapeutic exercises, 97530- Therapeutic activity, [id number]- Neuromuscular re-education, 97535- Self Care, [id number]- Manual therapy, [id number]- Gait training, [phone]- Aquatic Therapy, 97014- Electrical stimulation (unattended), 97035- Ultrasound, Patient/Family education, Stair training, Taping, Dry Needling, DME instructions, Cryotherapy, and Moist heat   PLAN FOR NEXT SESSION:  Begin per hip labral protocol   Dale Call, PT 04/04/2024, 12:42 PM

## 2024-04-06 ENCOUNTER — Other Ambulatory Visit (HOSPITAL_BASED_OUTPATIENT_CLINIC_OR_DEPARTMENT_OTHER): Payer: Self-pay

## 2024-04-06 ENCOUNTER — Ambulatory Visit (HOSPITAL_BASED_OUTPATIENT_CLINIC_OR_DEPARTMENT_OTHER): Admitting: Orthopaedic Surgery

## 2024-04-06 DIAGNOSIS — S73192A Other sprain of left hip, initial encounter: Secondary | ICD-10-CM

## 2024-04-06 MED ORDER — LIDOCAINE HCL 1 % IJ SOLN
4.0000 mL | INTRAMUSCULAR | Status: AC | PRN
  Administered 2024-04-06: 4 mL

## 2024-04-06 MED ORDER — TRIAMCINOLONE ACETONIDE 40 MG/ML IJ SUSP
80.0000 mg | INTRAMUSCULAR | Status: AC | PRN
  Administered 2024-04-06: 80 mg via INTRA_ARTICULAR

## 2024-04-06 NOTE — Progress Notes (Signed)
 Post Operative Evaluation    Procedure/Date of Surgery: Left hip arthroscopy with labral repair 6/24  Interval History:   Presents 6 weeks today.  Today's visit she is having some symptoms in the front particular over the psoas tendon.[name]   PMH/PSH/Family History/Social History/Meds/Allergies:    Past Medical History:  Diagnosis Date  . Depression   . Fibroid   . Medical history non-contributory    Past Surgical History:  Procedure Laterality Date  . CESAREAN SECTION N/A 09/10/2017   Procedure: CESAREAN SECTION;  Surgeon: Dannielle Bouchard, DO;  Location: [facility];  Service: Obstetrics;  Laterality: N/A;  Primary edc 09/16/17 NKDA [name] RNFA  . CESAREAN SECTION N/A 08/29/2019   Procedure: CESAREAN SECTION;  Surgeon: Dannielle Bouchard, DO;  Location: [facility];  Service: Obstetrics;  Laterality: N/A;  REPEAT EDC 09/08/19 NKDA [name],  RNFA  . WISDOM TOOTH EXTRACTION     Social History   Socioeconomic History  . Marital status: Married    Spouse name: Not on file  . Number of children: Not on file  . Years of education: Not on file  . Highest education level: Not on file  Occupational History  . Not on file  Tobacco Use  . Smoking status: Never  . Smokeless tobacco: Never  Vaping Use  . Vaping status: Never Used  Substance and Sexual Activity  . Alcohol use: No    Comment: not since pregnancy  . Drug use: No  . Sexual activity: Not Currently    Comment: due to pain asssociated with intercourse   Other Topics Concern  . Not on file  Social History Narrative  . Not on file   Social Drivers of Health   Financial Resource Strain: Not on file  Food Insecurity: Not on file  Transportation Needs: Not on file  Physical Activity: Not on file  Stress: Not on file  Social Connections: Not on file   Family History  Problem Relation Age of Onset  . Hypertension Paternal Grandmother   . Cancer Paternal Grandmother   . Cancer  Paternal Grandfather   . Cancer Maternal Grandmother   . Kidney Stones Mother   . Diabetes Paternal Uncle    Allergies  Allergen Reactions  . Sulfamethoxazole -Trimethoprim  Rash   Current Outpatient Medications  Medication Sig Dispense Refill  . aspirin  EC 325 MG tablet Take 1 tablet (325 mg total) by mouth daily. 14 tablet 0  . LORazepam  (ATIVAN ) 0.5 MG tablet Take 1 tablet by mouth 30-60 minutes prior to procedure, can repeat dose once at time of procedure if needed. 2 tablet 0  . metroNIDAZOLE  (METROGEL ) 0.75 % gel Apply 1 Application topically daily. 45 g 0  . oxyCODONE  (ROXICODONE ) 5 MG immediate release tablet Take 1 tablet (5 mg total) by mouth every 4 (four) hours as needed for severe pain (pain score 7-10) or breakthrough pain. 10 tablet 0   No current facility-administered medications for this visit.   No results found.  Review of Systems:   A ROS was performed including pertinent positives and negatives as documented in the HPI.   Musculoskeletal Exam:      Left hip incisions are well-appearing without erythema or drainage.  Walks with a normal gait.  30 degrees internal/external rotation without pain.  Good abduction strength  Imaging:  I personally reviewed and interpreted the radiographs.   Assessment:   6 weeks status post left hip arthroscopic labral repair doing well.  I did recommend an ultrasound-guided injection of the psoas tendon today to hopefully get her some relief as well as showed her specific stretching routine that she can work on twice daily.  I will plan to see her back in 6 weeks for reassessment  Plan :    - Return to clinic 6 weeks for reassessment  Left hip psoas injection provided verbal consent obtained    Procedure Note  Patient: Jasmine Conrad             Date of Birth: August 27, 1986           MRN: [id number]             Visit Date: 04/06/2024  Procedures: Visit Diagnoses:  1. Tear of left acetabular labrum, initial  encounter     Large Joint Inj: L hip joint on 04/06/2024 12:12 PM Indications: pain Details: 22 G 3.5 in needle, ultrasound-guided anterolateral approach  Arthrogram: No  Medications: 4 mL lidocaine  1 %; 80 mg triamcinolone  acetonide 40 MG/ML Outcome: tolerated well, no immediate complications Procedure, treatment alternatives, risks and benefits explained, specific risks discussed. Consent was given by the patient. Immediately prior to procedure a time out was called to verify the correct patient, procedure, equipment, support staff and site/side marked as required. Patient was prepped and draped in the usual sterile fashion.          I personally saw and evaluated the patient, and participated in the management and treatment plan.  Elspeth Parker, MD Attending Physician, Orthopedic Surgery  This document was dictated using Dragon voice recognition software. A reasonable attempt at proof reading has been made to minimize errors.

## 2024-04-07 ENCOUNTER — Ambulatory Visit (HOSPITAL_BASED_OUTPATIENT_CLINIC_OR_DEPARTMENT_OTHER): Admitting: Physical Therapy

## 2024-04-07 ENCOUNTER — Encounter (HOSPITAL_BASED_OUTPATIENT_CLINIC_OR_DEPARTMENT_OTHER): Payer: Self-pay | Admitting: Physical Therapy

## 2024-04-07 DIAGNOSIS — M25552 Pain in left hip: Secondary | ICD-10-CM

## 2024-04-07 DIAGNOSIS — R29898 Other symptoms and signs involving the musculoskeletal system: Secondary | ICD-10-CM

## 2024-04-07 DIAGNOSIS — R2689 Other abnormalities of gait and mobility: Secondary | ICD-10-CM | POA: Diagnosis not present

## 2024-04-07 NOTE — Therapy (Signed)
 OUTPATIENT PHYSICAL THERAPY LOWER EXTREMITY EVALUATION   Patient Name: Jasmine Conrad MRN: [id number] DOB:1986/06/14, 38 y.o., female Today's Date: 04/07/2024  END OF SESSION:  PT End of Session - 04/07/24 1359     Visit Number 10    Number of Visits 24    Date for PT Re-Evaluation 05/20/24    Authorization Type [employer]    PT Start Time 1400    PT Stop Time 1445    PT Time Calculation (min) 45 min    Activity Tolerance Patient tolerated treatment well;No increased pain    Behavior During Therapy WFL for tasks assessed/performed                Past Medical History:  Diagnosis Date   Depression    Fibroid    Medical history non-contributory    Past Surgical History:  Procedure Laterality Date   CESAREAN SECTION N/A 09/10/2017   Procedure: CESAREAN SECTION;  Surgeon: Dannielle Bouchard, DO;  Location: [facility];  Service: Obstetrics;  Laterality: N/A;  Primary edc 09/16/17 NKDA [name] RNFA   CESAREAN SECTION N/A 08/29/2019   Procedure: CESAREAN SECTION;  Surgeon: Dannielle Bouchard, DO;  Location: [facility];  Service: Obstetrics;  Laterality: N/A;  REPEAT EDC 09/08/19 NKDA [name],  RNFA   WISDOM TOOTH EXTRACTION     Patient Active Problem List   Diagnosis Date Noted   Tear of left acetabular labrum 02/23/2024   Previous cesarean section 08/29/2019   S/P cesarean section 09/10/2017   Trauma during pregnancy 07/03/2017   Left shoulder pain 04/01/2016    PCP: Arnett Repress MD   REFERRING PROVIDER:  Elspeth Parker MD  REFERRING DIAG:  Diagnosis  [phone] (ICD-10-CM) - Tear of left acetabular labrum, initial encounter    THERAPY DIAG:  Left hip pain  Weakness of left hip  Other abnormalities of gait and mobility  Rationale for Evaluation and Treatment: Rehabilitation  ONSET DATE: 02/23/2024 Days since surgery: 44    SUBJECTIVE:   SUBJECTIVE STATEMENT: Pt reports increase in anterior hip pain about 2 days ago. Pt saw MD and got CSI into the  anterior hip.    Eval: She has a long history of left hip pain.  He trialed conservative therapy with no significant improvement in pain.  On 02/23/2024 she had a left hip labral repair.  Her pain is well-controlled at this time.  She is not using any assistive device.  PERTINENT HISTORY: Nothing pertinent PAIN:  Are you having pain? Yes: NPRS scale: 3 out of 10 consistently Pain location: Anterior hip Pain description: aching  Aggravating factors: prolonged positioning  Relieving factors: medication and ice   PRECAUTIONS: None  RED FLAGS: None   WEIGHT BEARING RESTRICTIONS: No  FALLS:  Has patient fallen in last 6 months? No  LIVING ENVIRONMENT: Has steps at her house   OCCUPATION:  Works for [employer].  Off this week but remote next week.   PLOF: Independent  PATIENT GOALS:    NEXT MD VISIT:  Next week   OBJECTIVE:  Note: Objective measures were completed at Evaluation unless otherwise noted.  DIAGNOSTIC FINDINGS:  Nothing post op   PATIENT SURVEYS:  LEFS  Extreme difficulty/unable (0), Quite a bit of difficulty (1), Moderate difficulty (2), Little difficulty (3), No difficulty (4) Survey date:    Any of your usual work, housework or school activities   2. Usual hobbies, recreational or sporting activities   3. Getting into/out of the bath   4. Walking between rooms  5. Putting on socks/shoes   6. Squatting    7. Lifting an object, like a bag of groceries from the floor   8. Performing light activities around your home   9. Performing heavy activities around your home   10. Getting into/out of a car   11. Walking 2 blocks   12. Walking 1 mile   13. Going up/down 10 stairs (1 flight)   14. Standing for 1 hour   15.  sitting for 1 hour   16. Running on even ground   17. Running on uneven ground   18. Making sharp turns while running fast   19. Hopping    20. Rolling over in bed   Score total:  14/80     COGNITION: Overall cognitive status: Within  functional limits for tasks assessed     SENSATION: WFL   POSTURE: No Significant postural limitations  PALPATION: No unexpected TTP  LOWER EXTREMITY ROM:  Passive ROM Right eval Left eval  Hip flexion  90  Hip extension    Hip abduction    Hip adduction    Hip internal rotation    Hip external rotation  18  Knee flexion    Knee extension    Ankle dorsiflexion    Ankle plantarflexion    Ankle inversion    Ankle eversion     (Blank rows = not tested)  LOWER EXTREMITY MMT:  MMT Right eval Left eval  Hip flexion    Hip extension    Hip abduction    Hip adduction    Hip internal rotation    Hip external rotation    Knee flexion    Knee extension    Ankle dorsiflexion    Ankle plantarflexion    Ankle inversion    Ankle eversion     (Blank rows = not tested) not tested 2nd to recent surgery    GAIT: Mild lateral movement with gait Decreased left single leg stance time                                                                                                                               TREATMENT DATE:  8/7  Recumbent bike warm up 5 min 2.5  Mulligan lateral grade III and inf grade III STM L hip flexor, quads, adductor, glutes  Half kneeling hip flexor stretch 30s 3x with SB Adductor stretch upright and adductor magnus position 30s 3x Seated piriformis stretch 30s 3x  Exercise/volume management, safety with beach activity, walking volume and symptom irritability  8/4  Recumbent bike warm up 5 min 2.5   Manual traction/LAD Mulligan lateral grade III and inf grade III Prone L hip PA grade III PROM prone hip IR/ER   Supine figure 4 slide 15x Supine 90/90 hip rotation 2x10 6 box step up 2x10 SLS 30s 3x    8/1 Manual traction/LAD Posterior  grade III and inf grade III  Neuro-re-ed:  Side lying  clams 3x10  Bridge 3x12   There-ex:  Prone hip extension 3x10  Ex bike L4    There-act:  Step up 3x10 2 inch  Lateral step up 2inch  3x10      7/30 Manual traction/LAD Mulligan lateral grade III and inf grade III  Thomas stretch EOB 30s 3x  Supine groin butterfly 30s 3x  Staggered bridge 3x10 Weighted SB 5lbs 3x10  STS 3x10 (mid thigh height, focus on hip ext/hinge)  Quadruped hip ext 3x10  Bike for cardio, increasing daily step count (avg 7k, can go up to 8k)    7/25 Manual: Grade I inferior glides  Trigger point release to QL, gluteal, and anterior Review of self soft tissue mobilization using thera-cane  There-ex:  PPT 2x15  Prone knee flexion 2x12  Prone ER/IR 2x12      7/23 Manual:  Roller to anterior quad   There-ex:  Upright bike 5 min   Bent knee fallouts 2x15   PROM into all planes   Prone hip extension 3x10   LAQ x12 no weight  2x12 1.5 lbs   Neuro-re-ed:  Side lying clams 3x10  Bridge 3x12   Standing slow march 2x10  Standing heel/toe 2x15       PATIENT EDUCATION:  Education details: HEP, symptom management  Person educated: Patient Education method: Explanation, Demonstration, Tactile cues, Verbal cues, and Handouts Education comprehension: verbalized understanding, returned demonstration, verbal cues required, tactile cues required, and needs further education  HOME EXERCISE PROGRAM: Access Code: [email or web address] URL: [email or web address] Date: 02/26/2024 Prepared by: Alm Don   ASSESSMENT:  CLINICAL IMPRESSION:    Pt nearly 6 wks post op at this time. Pt returns from MD visit post CSI. Pt does continue to feel anterior, medial, and posterior hip pain and tightness as she has begun to increase activity. Pt does report improvement in pain following manual therapy and progressive self stretching. Plan to continue with joint mobilizations and STM for pain management- plan for LAD, lumbar UPA, and prone figure 4 Pas. Consider progression of SLS on pliant surface, step ups for HEP, and addition of sidestepping. Plan to progress as per protocol to address  continued impairment and deficits post-op.    OBJECTIVE IMPAIRMENTS: Abnormal gait, decreased activity tolerance, difficulty walking, decreased ROM, decreased strength, and pain.   ACTIVITY LIMITATIONS: carrying, lifting, sitting, standing, squatting, sleeping, stairs, transfers, bed mobility, bathing, and dressing  PARTICIPATION LIMITATIONS: meal prep, cleaning, laundry, driving, shopping, community activity, and occupation  PERSONAL FACTORS: None   REHAB POTENTIAL: Excellent  CLINICAL DECISION MAKING: Stable/uncomplicated  EVALUATION COMPLEXITY: Low   GOALS: Goals reviewed with patient? Yes  SHORT TERM GOALS: Target date: 03/25/2024   Patient will increase passive right hip flexion 90 degrees Baseline: Goal status: achieved 7/23  2.  Patient will wean off assistive device as tolerated Baseline:  Goal status: achieved 7/23  3.  Patient will be independent with basic HEP Baseline:  Goal status: INITIAL   LONG TERM GOALS: Target date: 05/20/2024    Patient will go up and down 8 steps with reciprocal gait pattern Baseline:  Goal status: INITIAL  2.  Patient will ambulate community distances without increased pain Baseline:  Goal status: INITIAL  3.  Patient will stand for greater than 1 hour without increased pain in order to perform ADLs Baseline:  Goal status: INITIAL  4.  Patient will return to full exercise routine Baseline:  Goal status: INITIAL    PLAN:  PT FREQUENCY: 2x/week  PT DURATION: 12  weeks  PLANNED INTERVENTIONS: 97110-Therapeutic exercises, 97530- Therapeutic activity, [id number]- Neuromuscular re-education, 97535- Self Care, 02859- Manual therapy, [id number]- Gait training, [phone]- Aquatic Therapy, 97014- Electrical stimulation (unattended), [phone]- Ultrasound, Patient/Family education, Stair training, Taping, Dry Needling, DME instructions, Cryotherapy, and Moist heat   PLAN FOR NEXT SESSION:  Begin per hip labral protocol   Dale Call,  PT 04/07/2024, 3:01 PM

## 2024-04-14 ENCOUNTER — Encounter (HOSPITAL_BASED_OUTPATIENT_CLINIC_OR_DEPARTMENT_OTHER): Payer: Self-pay | Admitting: Physical Therapy

## 2024-04-14 ENCOUNTER — Ambulatory Visit (HOSPITAL_BASED_OUTPATIENT_CLINIC_OR_DEPARTMENT_OTHER): Admitting: Physical Therapy

## 2024-04-14 DIAGNOSIS — R2689 Other abnormalities of gait and mobility: Secondary | ICD-10-CM

## 2024-04-14 DIAGNOSIS — R29898 Other symptoms and signs involving the musculoskeletal system: Secondary | ICD-10-CM | POA: Diagnosis not present

## 2024-04-14 DIAGNOSIS — M25552 Pain in left hip: Secondary | ICD-10-CM | POA: Diagnosis not present

## 2024-04-14 NOTE — Therapy (Signed)
 OUTPATIENT PHYSICAL THERAPY LOWER EXTREMITY EVALUATION   Patient Name: Jasmine Conrad MRN: [id number] DOB:1985-09-11, 38 y.o., female Today's Date: 04/14/2024  END OF SESSION:          Past Medical History:  Diagnosis Date   Depression    Fibroid    Medical history non-contributory    Past Surgical History:  Procedure Laterality Date   CESAREAN SECTION N/A 09/10/2017   Procedure: CESAREAN SECTION;  Surgeon: Dannielle Bouchard, DO;  Location: [facility];  Service: Obstetrics;  Laterality: N/A;  Primary edc 09/16/17 NKDA [name] RNFA   CESAREAN SECTION N/A 08/29/2019   Procedure: CESAREAN SECTION;  Surgeon: Dannielle Bouchard, DO;  Location: [facility];  Service: Obstetrics;  Laterality: N/A;  REPEAT EDC 09/08/19 NKDA [name],  RNFA   WISDOM TOOTH EXTRACTION     Patient Active Problem List   Diagnosis Date Noted   Tear of left acetabular labrum 02/23/2024   Previous cesarean section 08/29/2019   S/P cesarean section 09/10/2017   Trauma during pregnancy 07/03/2017   Left shoulder pain 04/01/2016    PCP: Arnett Repress MD   REFERRING PROVIDER:  Elspeth Parker MD  REFERRING DIAG:  Diagnosis  [phone] (ICD-10-CM) - Tear of left acetabular labrum, initial encounter    THERAPY DIAG:  No diagnosis found.  Rationale for Evaluation and Treatment: Rehabilitation  ONSET DATE: 02/23/2024 Days since surgery: 51    SUBJECTIVE:   SUBJECTIVE STATEMENT: The patient reports it has felt pretty good over the past few days.    Eval: She has a long history of left hip pain.  He trialed conservative therapy with no significant improvement in pain.  On 02/23/2024 she had a left hip labral repair.  Her pain is well-controlled at this time.  She is not using any assistive device.  PERTINENT HISTORY: Nothing pertinent PAIN:  Are you having pain? Yes: NPRS scale: 3 out of 10 consistently Pain location: Anterior hip Pain description: aching  Aggravating factors: prolonged  positioning  Relieving factors: medication and ice   PRECAUTIONS: None  RED FLAGS: None   WEIGHT BEARING RESTRICTIONS: No  FALLS:  Has patient fallen in last 6 months? No  LIVING ENVIRONMENT: Has steps at her house   OCCUPATION:  Works for [employer].  Off this week but remote next week.   PLOF: Independent  PATIENT GOALS:    NEXT MD VISIT:  Next week   OBJECTIVE:  Note: Objective measures were completed at Evaluation unless otherwise noted.  DIAGNOSTIC FINDINGS:  Nothing post op   PATIENT SURVEYS:  LEFS  Extreme difficulty/unable (0), Quite a bit of difficulty (1), Moderate difficulty (2), Little difficulty (3), No difficulty (4) Survey date:    Any of your usual work, housework or school activities   2. Usual hobbies, recreational or sporting activities   3. Getting into/out of the bath   4. Walking between rooms   5. Putting on socks/shoes   6. Squatting    7. Lifting an object, like a bag of groceries from the floor   8. Performing light activities around your home   9. Performing heavy activities around your home   10. Getting into/out of a car   11. Walking 2 blocks   12. Walking 1 mile   13. Going up/down 10 stairs (1 flight)   14. Standing for 1 hour   15.  sitting for 1 hour   16. Running on even ground   17. Running on uneven ground   18. Making sharp  turns while running fast   19. Hopping    20. Rolling over in bed   Score total:  14/80     COGNITION: Overall cognitive status: Within functional limits for tasks assessed     SENSATION: WFL   POSTURE: No Significant postural limitations  PALPATION: No unexpected TTP  LOWER EXTREMITY ROM:  Passive ROM Right eval Left eval  Hip flexion  90  Hip extension    Hip abduction    Hip adduction    Hip internal rotation    Hip external rotation  18  Knee flexion    Knee extension    Ankle dorsiflexion    Ankle plantarflexion    Ankle inversion    Ankle eversion     (Blank rows = not  tested)  LOWER EXTREMITY MMT:  MMT Right eval Left eval  Hip flexion    Hip extension    Hip abduction    Hip adduction    Hip internal rotation    Hip external rotation    Knee flexion    Knee extension    Ankle dorsiflexion    Ankle plantarflexion    Ankle inversion    Ankle eversion     (Blank rows = not tested) not tested 2nd to recent surgery    GAIT: Mild lateral movement with gait Decreased left single leg stance time                                                                                                                               TREATMENT DATE:  8/14 Manual: Manual traction/LAD PROM prone hip IR/ER  Roller to anterior hip  Nuero-re-ed Bridge 3x10  Side lying clamshell 3x10   There-ex: Prone hip extension 3x10   There-act:  Squat 3x10     8/7  Recumbent bike warm up 5 min 2.5  Mulligan lateral grade III and inf grade III STM L hip flexor, quads, adductor, glutes  Half kneeling hip flexor stretch 30s 3x with SB Adductor stretch upright and adductor magnus position 30s 3x Seated piriformis stretch 30s 3x  Exercise/volume management, safety with beach activity, walking volume and symptom irritability  8/4  Recumbent bike warm up 5 min 2.5   Manual traction/LAD Mulligan lateral grade III and inf grade III Prone L hip PA grade III PROM prone hip IR/ER   Supine figure 4 slide 15x Supine 90/90 hip rotation 2x10 6 box step up 2x10 SLS 30s 3x    8/1 Manual traction/LAD Posterior  grade III and inf grade III  Neuro-re-ed:  Side lying clams 3x10  Bridge 3x12   There-ex:  Prone hip extension 3x10  Ex bike L4    There-act:  Step up 3x10 2 inch  Lateral step up 2inch 3x10      7/30 Manual traction/LAD Mulligan lateral grade III and inf grade III  Thomas stretch EOB 30s 3x  Supine groin butterfly 30s 3x  Staggered bridge 3x10  Weighted SB 5lbs 3x10  STS 3x10 (mid thigh height, focus on hip ext/hinge)  Quadruped  hip ext 3x10  Bike for cardio, increasing daily step count (avg 7k, can go up to 8k)    7/25 Manual: Grade I inferior glides  Trigger point release to QL, gluteal, and anterior Review of self soft tissue mobilization using thera-cane  There-ex:  PPT 2x15  Prone knee flexion 2x12  Prone ER/IR 2x12      7/23 Manual:  Roller to anterior quad   There-ex:  Upright bike 5 min   Bent knee fallouts 2x15   PROM into all planes   Prone hip extension 3x10   LAQ x12 no weight  2x12 1.5 lbs   Neuro-re-ed:  Side lying clams 3x10  Bridge 3x12   Standing slow march 2x10  Standing heel/toe 2x15       PATIENT EDUCATION:  Education details: HEP, symptom management  Person educated: Patient Education method: Explanation, Demonstration, Tactile cues, Verbal cues, and Handouts Education comprehension: verbalized understanding, returned demonstration, verbal cues required, tactile cues required, and needs further education  HOME EXERCISE PROGRAM: Access Code: [email or web address] URL: [email or web address] Date: 02/26/2024 Prepared by: Alm Don   ASSESSMENT:  CLINICAL IMPRESSION:    Patient continues to tolerate treatment well.  She has had less pain over the past few weeks.  We continue to advance functional strengthening.  We added squats in today.  She was able to go through the full squat position without any significant pain.  Her range of motion is full.  She continues to have mild anterior tightness in her hip.  She is advised to continue with self soft tissue mobilization.  Will continue to progress into functional mobility as tolerated.  OBJECTIVE IMPAIRMENTS: Abnormal gait, decreased activity tolerance, difficulty walking, decreased ROM, decreased strength, and pain.   ACTIVITY LIMITATIONS: carrying, lifting, sitting, standing, squatting, sleeping, stairs, transfers, bed mobility, bathing, and dressing  PARTICIPATION LIMITATIONS: meal prep, cleaning,  laundry, driving, shopping, community activity, and occupation  PERSONAL FACTORS: None   REHAB POTENTIAL: Excellent  CLINICAL DECISION MAKING: Stable/uncomplicated  EVALUATION COMPLEXITY: Low   GOALS: Goals reviewed with patient? Yes  SHORT TERM GOALS: Target date: 03/25/2024   Patient will increase passive right hip flexion 90 degrees Baseline: Goal status: achieved 7/23  2.  Patient will wean off assistive device as tolerated Baseline:  Goal status: achieved 7/23  3.  Patient will be independent with basic HEP Baseline:  Goal status: INITIAL   LONG TERM GOALS: Target date: 05/20/2024    Patient will go up and down 8 steps with reciprocal gait pattern Baseline:  Goal status: INITIAL  2.  Patient will ambulate community distances without increased pain Baseline:  Goal status: INITIAL  3.  Patient will stand for greater than 1 hour without increased pain in order to perform ADLs Baseline:  Goal status: INITIAL  4.  Patient will return to full exercise routine Baseline:  Goal status: INITIAL    PLAN:  PT FREQUENCY: 2x/week  PT DURATION: 12 weeks  PLANNED INTERVENTIONS: 97110-Therapeutic exercises, 97530- Therapeutic activity, [id number]- Neuromuscular re-education, 97535- Self Care, [id number]- Manual therapy, [phone]- Gait training, [phone]- Aquatic Therapy, 97014- Electrical stimulation (unattended), 97035- Ultrasound, Patient/Family education, Stair training, Taping, Dry Needling, DME instructions, Cryotherapy, and Moist heat   PLAN FOR NEXT SESSION:  Begin per hip labral protocol   Alm JINNY Don, PT 04/14/2024, 12:06 PM

## 2024-04-15 ENCOUNTER — Encounter (HOSPITAL_BASED_OUTPATIENT_CLINIC_OR_DEPARTMENT_OTHER): Payer: Self-pay | Admitting: Physical Therapy

## 2024-04-18 ENCOUNTER — Encounter (HOSPITAL_BASED_OUTPATIENT_CLINIC_OR_DEPARTMENT_OTHER): Payer: Self-pay | Admitting: Physical Therapy

## 2024-04-18 ENCOUNTER — Ambulatory Visit (HOSPITAL_BASED_OUTPATIENT_CLINIC_OR_DEPARTMENT_OTHER): Admitting: Physical Therapy

## 2024-04-18 DIAGNOSIS — M25552 Pain in left hip: Secondary | ICD-10-CM

## 2024-04-18 DIAGNOSIS — R29898 Other symptoms and signs involving the musculoskeletal system: Secondary | ICD-10-CM | POA: Diagnosis not present

## 2024-04-18 DIAGNOSIS — R2689 Other abnormalities of gait and mobility: Secondary | ICD-10-CM | POA: Diagnosis not present

## 2024-04-18 NOTE — Therapy (Signed)
 OUTPATIENT PHYSICAL THERAPY LOWER EXTREMITY EVALUATION   Patient Name: Jasmine Conrad MRN: [id number] DOB:[birth year], 38 y.o., female Today's Date: 04/18/2024  END OF SESSION:  PT End of Session - 04/18/24 1453     Visit Number 12    Number of Visits 24    Date for PT Re-Evaluation 05/20/24    Authorization Type [employer]    PT Start Time 1450    PT Stop Time 1532    PT Time Calculation (min) 42 min    Activity Tolerance Patient tolerated treatment well;No increased pain    Behavior During Therapy WFL for tasks assessed/performed                 Past Medical History:  Diagnosis Date   Depression    Fibroid    Medical history non-contributory    Past Surgical History:  Procedure Laterality Date   CESAREAN SECTION N/A 09/10/2017   Procedure: CESAREAN SECTION;  Surgeon: Dannielle Bouchard, DO;  Location: [facility];  Service: Obstetrics;  Laterality: N/A;  Primary edc 09/16/17 NKDA [name] RNFA   CESAREAN SECTION N/A 08/29/2019   Procedure: CESAREAN SECTION;  Surgeon: Dannielle Bouchard, DO;  Location: [facility];  Service: Obstetrics;  Laterality: N/A;  REPEAT EDC 09/08/19 NKDA [name],  RNFA   WISDOM TOOTH EXTRACTION     Patient Active Problem List   Diagnosis Date Noted   Tear of left acetabular labrum 02/23/2024   Previous cesarean section 08/29/2019   S/P cesarean section 09/10/2017   Trauma during pregnancy 07/03/2017   Left shoulder pain 04/01/2016    PCP: Arnett Repress MD   REFERRING PROVIDER:  Elspeth Parker MD  REFERRING DIAG:  Diagnosis  [phone] (ICD-10-CM) - Tear of left acetabular labrum, initial encounter    THERAPY DIAG:  Left hip pain  Weakness of left hip  Other abnormalities of gait and mobility  Rationale for Evaluation and Treatment: Rehabilitation  ONSET DATE: 02/23/2024 Days since surgery: 55    SUBJECTIVE:   SUBJECTIVE STATEMENT: Pt reports no pain after last session. Pt reports some L hip soreness and pain with doing  squatting/kneeling but no issues otherwise.    Eval: She has a long history of left hip pain.  He trialed conservative therapy with no significant improvement in pain.  On 02/23/2024 she had a left hip labral repair.  Her pain is well-controlled at this time.  She is not using any assistive device.  PERTINENT HISTORY: Nothing pertinent PAIN:  Are you having pain? Yes: NPRS scale: 3 out of 10 consistently Pain location: Anterior hip Pain description: aching  Aggravating factors: prolonged positioning  Relieving factors: medication and ice   PRECAUTIONS: None  RED FLAGS: None   WEIGHT BEARING RESTRICTIONS: No  FALLS:  Has patient fallen in last 6 months? No  LIVING ENVIRONMENT: Has steps at her house   OCCUPATION:  Works for [employer].  Off this week but remote next week.   PLOF: Independent  PATIENT GOALS:    NEXT MD VISIT:  Next week   OBJECTIVE:  Note: Objective measures were completed at Evaluation unless otherwise noted.  DIAGNOSTIC FINDINGS:  Nothing post op   PATIENT SURVEYS:  LEFS  Extreme difficulty/unable (0), Quite a bit of difficulty (1), Moderate difficulty (2), Little difficulty (3), No difficulty (4) Survey date:    Any of your usual work, housework or school activities   2. Usual hobbies, recreational or sporting activities   3. Getting into/out of the bath   4. Walking  between rooms   5. Putting on socks/shoes   6. Squatting    7. Lifting an object, like a bag of groceries from the floor   8. Performing light activities around your home   9. Performing heavy activities around your home   10. Getting into/out of a car   11. Walking 2 blocks   12. Walking 1 mile   13. Going up/down 10 stairs (1 flight)   14. Standing for 1 hour   15.  sitting for 1 hour   16. Running on even ground   17. Running on uneven ground   18. Making sharp turns while running fast   19. Hopping    20. Rolling over in bed   Score total:  14/80      COGNITION: Overall cognitive status: Within functional limits for tasks assessed     SENSATION: WFL   POSTURE: No Significant postural limitations  PALPATION: No unexpected TTP  LOWER EXTREMITY ROM:  Passive ROM Right eval Left eval  Hip flexion  90  Hip extension    Hip abduction    Hip adduction    Hip internal rotation    Hip external rotation  18  Knee flexion    Knee extension    Ankle dorsiflexion    Ankle plantarflexion    Ankle inversion    Ankle eversion     (Blank rows = not tested)  LOWER EXTREMITY MMT:  MMT Right eval Left eval  Hip flexion    Hip extension    Hip abduction    Hip adduction    Hip internal rotation    Hip external rotation    Knee flexion    Knee extension    Ankle dorsiflexion    Ankle plantarflexion    Ankle inversion    Ankle eversion     (Blank rows = not tested) not tested 2nd to recent surgery    GAIT: Mild lateral movement with gait Decreased left single leg stance time                                                                                                                               TREATMENT DATE:   8/18  Recumbent bike warm up 5 min 2.5  Mulligan lateral grade III and inf grade III Prone figure 4 PA grade III PROM to tolerance  Deep squat progression with UE support 4x5  Rotational RDL/ kickstand RDL 3x8 Runner's step up 8 3x8  Return to gym safety: control for intensity, volume, and no impact/plyometric  8/14 Manual: Manual traction/LAD PROM prone hip IR/ER  Roller to anterior hip   Nuero-re-ed Bridge 3x10  Side lying clamshell 3x10   There-ex: Prone hip extension 3x10   There-act:  Squat 3x10     8/7  Recumbent bike warm up 5 min 2.5  Mulligan lateral grade III and inf grade III STM L hip flexor, quads, adductor, glutes  Half kneeling  hip flexor stretch 30s 3x with SB Adductor stretch upright and adductor magnus position 30s 3x Seated piriformis stretch 30s  3x  Exercise/volume management, safety with beach activity, walking volume and symptom irritability  8/4  Recumbent bike warm up 5 min 2.5   Manual traction/LAD Mulligan lateral grade III and inf grade III Prone L hip PA grade III PROM prone hip IR/ER   Supine figure 4 slide 15x Supine 90/90 hip rotation 2x10 6 box step up 2x10 SLS 30s 3x    8/1 Manual traction/LAD Posterior  grade III and inf grade III  Neuro-re-ed:  Side lying clams 3x10  Bridge 3x12   There-ex:  Prone hip extension 3x10  Ex bike L4    There-act:  Step up 3x10 2 inch  Lateral step up 2inch 3x10      7/30 Manual traction/LAD Mulligan lateral grade III and inf grade III  Thomas stretch EOB 30s 3x  Supine groin butterfly 30s 3x  Staggered bridge 3x10 Weighted SB 5lbs 3x10  STS 3x10 (mid thigh height, focus on hip ext/hinge)  Quadruped hip ext 3x10  Bike for cardio, increasing daily step count (avg 7k, can go up to 8k)    7/25 Manual: Grade I inferior glides  Trigger point release to QL, gluteal, and anterior Review of self soft tissue mobilization using thera-cane  There-ex:  PPT 2x15  Prone knee flexion 2x12  Prone ER/IR 2x12      7/23 Manual:  Roller to anterior quad   There-ex:  Upright bike 5 min   Bent knee fallouts 2x15   PROM into all planes   Prone hip extension 3x10   LAQ x12 no weight  2x12 1.5 lbs   Neuro-re-ed:  Side lying clams 3x10  Bridge 3x12   Standing slow march 2x10  Standing heel/toe 2x15       PATIENT EDUCATION:  Education details: HEP, symptom management  Person educated: Patient Education method: Explanation, Demonstration, Tactile cues, Verbal cues, and Handouts Education comprehension: verbalized understanding, returned demonstration, verbal cues required, tactile cues required, and needs further education  HOME EXERCISE PROGRAM: Access Code: [email or web address] URL: [email or web address] Date: 02/26/2024 Prepared by:  Alm Don   ASSESSMENT:  CLINICAL IMPRESSION:    Pt nearly 8 wks at this time. ROM exercise and SL biased LE strength today without pain. Pt is still tight into hip ER but improved with manual therapy and progressive self stretching. Deep squat progression with fatigue only. Plan to continue with progressive strength and ROM as tolerated. Consider more SL biased exercise at next. Progress mobility as tolerated with loaded exercise.   OBJECTIVE IMPAIRMENTS: Abnormal gait, decreased activity tolerance, difficulty walking, decreased ROM, decreased strength, and pain.   ACTIVITY LIMITATIONS: carrying, lifting, sitting, standing, squatting, sleeping, stairs, transfers, bed mobility, bathing, and dressing  PARTICIPATION LIMITATIONS: meal prep, cleaning, laundry, driving, shopping, community activity, and occupation  PERSONAL FACTORS: None   REHAB POTENTIAL: Excellent  CLINICAL DECISION MAKING: Stable/uncomplicated  EVALUATION COMPLEXITY: Low   GOALS: Goals reviewed with patient? Yes  SHORT TERM GOALS: Target date: 03/25/2024   Patient will increase passive right hip flexion 90 degrees Baseline: Goal status: achieved 7/23  2.  Patient will wean off assistive device as tolerated Baseline:  Goal status: achieved 7/23  3.  Patient will be independent with basic HEP Baseline:  Goal status: INITIAL   LONG TERM GOALS: Target date: 05/20/2024    Patient will go up and down 8 steps with reciprocal gait pattern Baseline:  Goal status:  INITIAL  2.  Patient will ambulate community distances without increased pain Baseline:  Goal status: INITIAL  3.  Patient will stand for greater than 1 hour without increased pain in order to perform ADLs Baseline:  Goal status: INITIAL  4.  Patient will return to full exercise routine Baseline:  Goal status: INITIAL    PLAN:  PT FREQUENCY: 2x/week  PT DURATION: 12 weeks  PLANNED INTERVENTIONS: 97110-Therapeutic exercises,  97530- Therapeutic activity, [id number]- Neuromuscular re-education, 97535- Self Care, 02859- Manual therapy, [id number]- Gait training, [phone]- Aquatic Therapy, 97014- Electrical stimulation (unattended), 97035- Ultrasound, Patient/Family education, Stair training, Taping, Dry Needling, DME instructions, Cryotherapy, and Moist heat   PLAN FOR NEXT SESSION:  Begin per hip labral protocol   Dale Call, PT 04/18/2024, 3:45 PM

## 2024-04-21 ENCOUNTER — Ambulatory Visit (HOSPITAL_BASED_OUTPATIENT_CLINIC_OR_DEPARTMENT_OTHER): Admitting: Physical Therapy

## 2024-04-21 DIAGNOSIS — M25552 Pain in left hip: Secondary | ICD-10-CM

## 2024-04-21 DIAGNOSIS — R2689 Other abnormalities of gait and mobility: Secondary | ICD-10-CM | POA: Diagnosis not present

## 2024-04-21 DIAGNOSIS — R29898 Other symptoms and signs involving the musculoskeletal system: Secondary | ICD-10-CM

## 2024-04-22 NOTE — Therapy (Signed)
 OUTPATIENT PHYSICAL THERAPY LOWER EXTREMITY EVALUATION   Patient Name: Jasmine Conrad MRN: [id number] DOB:[birth year], 38 y.o., female Today's Date: 04/22/2024  END OF SESSION:  PT End of Session - 04/22/24 1355     Visit Number 13    Number of Visits 24    Date for PT Re-Evaluation 05/20/24    Authorization Type [employer]    PT Start Time 1145    PT Stop Time 1228    PT Time Calculation (min) 43 min    Activity Tolerance Patient tolerated treatment well;No increased pain    Behavior During Therapy WFL for tasks assessed/performed                  Past Medical History:  Diagnosis Date   Depression    Fibroid    Medical history non-contributory    Past Surgical History:  Procedure Laterality Date   CESAREAN SECTION N/A 09/10/2017   Procedure: CESAREAN SECTION;  Surgeon: Dannielle Bouchard, DO;  Location: [facility];  Service: Obstetrics;  Laterality: N/A;  Primary edc 09/16/17 NKDA [name] RNFA   CESAREAN SECTION N/A 08/29/2019   Procedure: CESAREAN SECTION;  Surgeon: Dannielle Bouchard, DO;  Location: [facility];  Service: Obstetrics;  Laterality: N/A;  REPEAT EDC 09/08/19 NKDA [name],  RNFA   WISDOM TOOTH EXTRACTION     Patient Active Problem List   Diagnosis Date Noted   Tear of left acetabular labrum 02/23/2024   Previous cesarean section 08/29/2019   S/P cesarean section 09/10/2017   Trauma during pregnancy 07/03/2017   Left shoulder pain 04/01/2016    PCP: Arnett Repress MD   REFERRING PROVIDER:  Elspeth Parker MD  REFERRING DIAG:  Diagnosis  [phone] (ICD-10-CM) - Tear of left acetabular labrum, initial encounter    THERAPY DIAG:  Left hip pain  Weakness of left hip  Other abnormalities of gait and mobility  Rationale for Evaluation and Treatment: Rehabilitation  ONSET DATE: 02/23/2024 Days since surgery: 58    SUBJECTIVE:   SUBJECTIVE STATEMENT: The patient reports it continues to feel pretty good. She wasn't too sore after the last  visit.   Eval: She has a long history of left hip pain.  He trialed conservative therapy with no significant improvement in pain.  On 02/23/2024 she had a left hip labral repair.  Her pain is well-controlled at this time.  She is not using any assistive device.  PERTINENT HISTORY: Nothing pertinent PAIN:  Are you having pain? Yes: NPRS scale: 3 out of 10 consistently Pain location: Anterior hip Pain description: aching  Aggravating factors: prolonged positioning  Relieving factors: medication and ice   PRECAUTIONS: None  RED FLAGS: None   WEIGHT BEARING RESTRICTIONS: No  FALLS:  Has patient fallen in last 6 months? No  LIVING ENVIRONMENT: Has steps at her house   OCCUPATION:  Works for [employer].  Off this week but remote next week.   PLOF: Independent  PATIENT GOALS:    NEXT MD VISIT:  Next week   OBJECTIVE:  Note: Objective measures were completed at Evaluation unless otherwise noted.  DIAGNOSTIC FINDINGS:  Nothing post op   PATIENT SURVEYS:  LEFS  Extreme difficulty/unable (0), Quite a bit of difficulty (1), Moderate difficulty (2), Little difficulty (3), No difficulty (4) Survey date:    Any of your usual work, housework or school activities   2. Usual hobbies, recreational or sporting activities   3. Getting into/out of the bath   4. Walking between rooms   5.  Putting on socks/shoes   6. Squatting    7. Lifting an object, like a bag of groceries from the floor   8. Performing light activities around your home   9. Performing heavy activities around your home   10. Getting into/out of a car   11. Walking 2 blocks   12. Walking 1 mile   13. Going up/down 10 stairs (1 flight)   14. Standing for 1 hour   15.  sitting for 1 hour   16. Running on even ground   17. Running on uneven ground   18. Making sharp turns while running fast   19. Hopping    20. Rolling over in bed   Score total:  14/80     COGNITION: Overall cognitive status: Within functional  limits for tasks assessed     SENSATION: WFL   POSTURE: No Significant postural limitations  PALPATION: No unexpected TTP  LOWER EXTREMITY ROM:  Passive ROM Right eval Left eval  Hip flexion  90  Hip extension    Hip abduction    Hip adduction    Hip internal rotation    Hip external rotation  18  Knee flexion    Knee extension    Ankle dorsiflexion    Ankle plantarflexion    Ankle inversion    Ankle eversion     (Blank rows = not tested)  LOWER EXTREMITY MMT:  MMT Right eval Left eval  Hip flexion    Hip extension    Hip abduction    Hip adduction    Hip internal rotation    Hip external rotation    Knee flexion    Knee extension    Ankle dorsiflexion    Ankle plantarflexion    Ankle inversion    Ankle eversion     (Blank rows = not tested) not tested 2nd to recent surgery    GAIT: Mild lateral movement with gait Decreased left single leg stance time                                                                                                                               TREATMENT DATE:  8/22  There-ex:  Ex bike 5 min L5  Knee extension machine 10 lbs LF 3x12  Cybex leg press 50 lbs 3x12     Neuro-re-ed:  Low amplitude hopping 30 sec  Lateral hop 2x12 each direction  Hop onto air-ex and hold fwd and lateral 2x10   8/18  Recumbent bike warm up 5 min 2.5  Mulligan lateral grade III and inf grade III Prone figure 4 PA grade III PROM to tolerance  Deep squat progression with UE support 4x5  Rotational RDL/ kickstand RDL 3x8 Runner's step up 8 3x8  Return to gym safety: control for intensity, volume, and no impact/plyometric  8/14 Manual: Manual traction/LAD PROM prone hip IR/ER  Roller to anterior hip   Nuero-re-ed Bridge 3x10  Side lying  clamshell 3x10   There-ex: Prone hip extension 3x10   There-act:  Squat 3x10     8/7  Recumbent bike warm up 5 min 2.5  Mulligan lateral grade III and inf grade III STM L hip  flexor, quads, adductor, glutes  Half kneeling hip flexor stretch 30s 3x with SB Adductor stretch upright and adductor magnus position 30s 3x Seated piriformis stretch 30s 3x  Exercise/volume management, safety with beach activity, walking volume and symptom irritability  8/4  Recumbent bike warm up 5 min 2.5   Manual traction/LAD Mulligan lateral grade III and inf grade III Prone L hip PA grade III PROM prone hip IR/ER   Supine figure 4 slide 15x Supine 90/90 hip rotation 2x10 6 box step up 2x10 SLS 30s 3x    8/1 Manual traction/LAD Posterior  grade III and inf grade III  Neuro-re-ed:  Side lying clams 3x10  Bridge 3x12   There-ex:  Prone hip extension 3x10  Ex bike L4    There-act:  Step up 3x10 2 inch  Lateral step up 2inch 3x10      7/30 Manual traction/LAD Mulligan lateral grade III and inf grade III  Thomas stretch EOB 30s 3x  Supine groin butterfly 30s 3x  Staggered bridge 3x10 Weighted SB 5lbs 3x10  STS 3x10 (mid thigh height, focus on hip ext/hinge)  Quadruped hip ext 3x10  Bike for cardio, increasing daily step count (avg 7k, can go up to 8k)    7/25 Manual: Grade I inferior glides  Trigger point release to QL, gluteal, and anterior Review of self soft tissue mobilization using thera-cane  There-ex:  PPT 2x15  Prone knee flexion 2x12  Prone ER/IR 2x12      PATIENT EDUCATION:  Education details: HEP, symptom management  Person educated: Patient Education method: Explanation, Demonstration, Tactile cues, Verbal cues, and Handouts Education comprehension: verbalized understanding, returned demonstration, verbal cues required, tactile cues required, and needs further education  HOME EXERCISE PROGRAM: Access Code: [email or web address] URL: [email or web address] Date: 02/26/2024 Prepared by: Alm Don   ASSESSMENT:  CLINICAL IMPRESSION:   The patient hopes to return to the gym sometime soon. She needs to be able to do  jumping jacks. We initiated light jump training She was advise to see how she does over the next few days. If she does well we will continue to advance her towards returning to the gym. We also reviewed gym equipment. She tolerated well.   OBJECTIVE IMPAIRMENTS: Abnormal gait, decreased activity tolerance, difficulty walking, decreased ROM, decreased strength, and pain.   ACTIVITY LIMITATIONS: carrying, lifting, sitting, standing, squatting, sleeping, stairs, transfers, bed mobility, bathing, and dressing  PARTICIPATION LIMITATIONS: meal prep, cleaning, laundry, driving, shopping, community activity, and occupation  PERSONAL FACTORS: None   REHAB POTENTIAL: Excellent  CLINICAL DECISION MAKING: Stable/uncomplicated  EVALUATION COMPLEXITY: Low   GOALS: Goals reviewed with patient? Yes  SHORT TERM GOALS: Target date: 03/25/2024   Patient will increase passive right hip flexion 90 degrees Baseline: Goal status: achieved 7/23  2.  Patient will wean off assistive device as tolerated Baseline:  Goal status: achieved 7/23  3.  Patient will be independent with basic HEP Baseline:  Goal status: INITIAL   LONG TERM GOALS: Target date: 05/20/2024    Patient will go up and down 8 steps with reciprocal gait pattern Baseline:  Goal status: INITIAL  2.  Patient will ambulate community distances without increased pain Baseline:  Goal status: INITIAL  3.  Patient will stand for greater than 1  hour without increased pain in order to perform ADLs Baseline:  Goal status: INITIAL  4.  Patient will return to full exercise routine Baseline:  Goal status: INITIAL    PLAN:  PT FREQUENCY: 2x/week  PT DURATION: 12 weeks  PLANNED INTERVENTIONS: 97110-Therapeutic exercises, 97530- Therapeutic activity, [id number]- Neuromuscular re-education, 97535- Self Care, [id number]- Manual therapy, [phone]- Gait training, [phone]- Aquatic Therapy, 97014- Electrical stimulation (unattended), [phone]- Ultrasound,  Patient/Family education, Stair training, Taping, Dry Needling, DME instructions, Cryotherapy, and Moist heat   PLAN FOR NEXT SESSION:  Begin per hip labral protocol   Alm JINNY Don, PT 04/22/2024, 1:56 PM

## 2024-04-25 ENCOUNTER — Ambulatory Visit (HOSPITAL_BASED_OUTPATIENT_CLINIC_OR_DEPARTMENT_OTHER): Admitting: Physical Therapy

## 2024-04-25 ENCOUNTER — Encounter (HOSPITAL_BASED_OUTPATIENT_CLINIC_OR_DEPARTMENT_OTHER): Payer: Self-pay | Admitting: Physical Therapy

## 2024-04-25 DIAGNOSIS — R2689 Other abnormalities of gait and mobility: Secondary | ICD-10-CM

## 2024-04-25 DIAGNOSIS — R29898 Other symptoms and signs involving the musculoskeletal system: Secondary | ICD-10-CM | POA: Diagnosis not present

## 2024-04-25 DIAGNOSIS — M25552 Pain in left hip: Secondary | ICD-10-CM

## 2024-04-25 NOTE — Therapy (Signed)
 OUTPATIENT PHYSICAL THERAPY LOWER EXTREMITY EVALUATION   Patient Name: Jasmine Conrad MRN: [id number] DOB:08-May-1986, 38 y.o., female Today's Date: 04/25/2024  END OF SESSION:  PT End of Session - 04/25/24 1440     Visit Number 14    Number of Visits 24    Date for PT Re-Evaluation 05/20/24    Authorization Type [employer]    PT Start Time 1404    PT Stop Time 1445    PT Time Calculation (min) 41 min    Activity Tolerance Patient tolerated treatment well;No increased pain    Behavior During Therapy WFL for tasks assessed/performed                   Past Medical History:  Diagnosis Date   Depression    Fibroid    Medical history non-contributory    Past Surgical History:  Procedure Laterality Date   CESAREAN SECTION N/A 09/10/2017   Procedure: CESAREAN SECTION;  Surgeon: Dannielle Bouchard, DO;  Location: [facility];  Service: Obstetrics;  Laterality: N/A;  Primary edc 09/16/17 NKDA [name] RNFA   CESAREAN SECTION N/A 08/29/2019   Procedure: CESAREAN SECTION;  Surgeon: Dannielle Bouchard, DO;  Location: [facility];  Service: Obstetrics;  Laterality: N/A;  REPEAT EDC 09/08/19 NKDA [name],  RNFA   WISDOM TOOTH EXTRACTION     Patient Active Problem List   Diagnosis Date Noted   Tear of left acetabular labrum 02/23/2024   Previous cesarean section 08/29/2019   S/P cesarean section 09/10/2017   Trauma during pregnancy 07/03/2017   Left shoulder pain 04/01/2016    PCP: Arnett Repress MD   REFERRING PROVIDER:  Elspeth Parker MD  REFERRING DIAG:  Diagnosis  [id number] (ICD-10-CM) - Tear of left acetabular labrum, initial encounter    THERAPY DIAG:  Left hip pain  Weakness of left hip  Other abnormalities of gait and mobility  Rationale for Evaluation and Treatment: Rehabilitation  ONSET DATE: 02/23/2024 Days since surgery: 62    SUBJECTIVE:   SUBJECTIVE STATEMENT: Pt reports the hip was sore after last sess   ion but no increase in discomfort. Still  hip joint tightness and mild pain with IR.   Eval: She has a long history of left hip pain.  He trialed conservative therapy with no significant improvement in pain.  On 02/23/2024 she had a left hip labral repair.  Her pain is well-controlled at this time.  She is not using any assistive device.  PERTINENT HISTORY: Nothing pertinent PAIN:  Are you having pain? Yes: NPRS scale: 2 out of 10 consistently Pain location: Anterior hip Pain description: aching  Aggravating factors: prolonged positioning  Relieving factors: medication and ice   PRECAUTIONS: None  RED FLAGS: None   WEIGHT BEARING RESTRICTIONS: No  FALLS:  Has patient fallen in last 6 months? No  LIVING ENVIRONMENT: Has steps at her house   OCCUPATION:  Works for [employer].  Off this week but remote next week.   PLOF: Independent  PATIENT GOALS:    NEXT MD VISIT:  Next week   OBJECTIVE:  Note: Objective measures were completed at Evaluation unless otherwise noted.  DIAGNOSTIC FINDINGS:  Nothing post op   PATIENT SURVEYS:  LEFS  Extreme difficulty/unable (0), Quite a bit of difficulty (1), Moderate difficulty (2), Little difficulty (3), No difficulty (4) Survey date:    Any of your usual work, housework or school activities   2. Usual hobbies, recreational or sporting activities   3. Getting into/out of the  bath   4. Walking between rooms   5. Putting on socks/shoes   6. Squatting    7. Lifting an object, like a bag of groceries from the floor   8. Performing light activities around your home   9. Performing heavy activities around your home   10. Getting into/out of a car   11. Walking 2 blocks   12. Walking 1 mile   13. Going up/down 10 stairs (1 flight)   14. Standing for 1 hour   15.  sitting for 1 hour   16. Running on even ground   17. Running on uneven ground   18. Making sharp turns while running fast   19. Hopping    20. Rolling over in bed   Score total:  14/80     COGNITION: Overall  cognitive status: Within functional limits for tasks assessed     SENSATION: WFL   POSTURE: No Significant postural limitations  PALPATION: No unexpected TTP  LOWER EXTREMITY ROM:  Passive ROM Right eval Left eval  Hip flexion  90  Hip extension    Hip abduction    Hip adduction    Hip internal rotation    Hip external rotation  18  Knee flexion    Knee extension    Ankle dorsiflexion    Ankle plantarflexion    Ankle inversion    Ankle eversion     (Blank rows = not tested)  LOWER EXTREMITY MMT:  MMT Right eval Left eval  Hip flexion    Hip extension    Hip abduction    Hip adduction    Hip internal rotation    Hip external rotation    Knee flexion    Knee extension    Ankle dorsiflexion    Ankle plantarflexion    Ankle inversion    Ankle eversion     (Blank rows = not tested) not tested 2nd to recent surgery    GAIT: Mild lateral movement with gait Decreased left single leg stance time                                                                                                                               TREATMENT DATE:  8/25 Mulligan lateral grade III and inf grade III Posterior L hip mob grade IV  Quadruped hip IR/ER self mob 10x 10lb goblet 3x8 (hip ER torque and sitting between hips)   TRX lateral lunge 2x10 TRX reverse lunge 2x10 Standing banded hip ABD 2x10    8/22  There-ex:  Ex bike 5 min L5  Knee extension machine 10 lbs LF 3x12  Cybex leg press 50 lbs 3x12     Neuro-re-ed:  Low amplitude hopping 30 sec  Lateral hop 2x12 each direction  Hop onto air-ex and hold fwd and lateral 2x10   8/18  Recumbent bike warm up 5 min 2.5  Mulligan lateral grade III and inf grade III Prone figure  4 PA grade III PROM to tolerance  Deep squat progression with UE support 4x5  Rotational RDL/ kickstand RDL 3x8 Runner's step up 8 3x8  Return to gym safety: control for intensity, volume, and no  impact/plyometric  8/14 Manual: Manual traction/LAD PROM prone hip IR/ER  Roller to anterior hip   Nuero-re-ed Bridge 3x10  Side lying clamshell 3x10   There-ex: Prone hip extension 3x10   There-act:  Squat 3x10     8/7  Recumbent bike warm up 5 min 2.5  Mulligan lateral grade III and inf grade III STM L hip flexor, quads, adductor, glutes  Half kneeling hip flexor stretch 30s 3x with SB Adductor stretch upright and adductor magnus position 30s 3x Seated piriformis stretch 30s 3x  Exercise/volume management, safety with beach activity, walking volume and symptom irritability  8/4  Recumbent bike warm up 5 min 2.5   Manual traction/LAD Mulligan lateral grade III and inf grade III Prone L hip PA grade III PROM prone hip IR/ER   Supine figure 4 slide 15x Supine 90/90 hip rotation 2x10 6 box step up 2x10 SLS 30s 3x    8/1 Manual traction/LAD Posterior  grade III and inf grade III  Neuro-re-ed:  Side lying clams 3x10  Bridge 3x12   There-ex:  Prone hip extension 3x10  Ex bike L4    There-act:  Step up 3x10 2 inch  Lateral step up 2inch 3x10      7/30 Manual traction/LAD Mulligan lateral grade III and inf grade III  Thomas stretch EOB 30s 3x  Supine groin butterfly 30s 3x  Staggered bridge 3x10 Weighted SB 5lbs 3x10  STS 3x10 (mid thigh height, focus on hip ext/hinge)  Quadruped hip ext 3x10  Bike for cardio, increasing daily step count (avg 7k, can go up to 8k)    7/25 Manual: Grade I inferior glides  Trigger point release to QL, gluteal, and anterior Review of self soft tissue mobilization using thera-cane  There-ex:  PPT 2x15  Prone knee flexion 2x12  Prone ER/IR 2x12      PATIENT EDUCATION:  Education details: HEP, symptom management  Person educated: Patient Education method: Explanation, Demonstration, Tactile cues, Verbal cues, and Handouts Education comprehension: verbalized understanding, returned demonstration,  verbal cues required, tactile cues required, and needs further education  HOME EXERCISE PROGRAM: Access Code: [email or web address] URL: [email or web address] Date: 02/26/2024 Prepared by: Alm Don   ASSESSMENT:  CLINICAL IMPRESSION:   Pt nearly 9 wks at this time. Pt able to continue with progression of SL exercise as well as progressive CKC movements into deeper degrees of hip flexion, ER, and IR. Pt is stiff with movements but no pain with exercise today once corrected for form and position. Consider HHD at next session or session after to assess for limb symmetry deficit. HEP updated today with frequency updated. Pt would benefit from continued skilled therapy in order to reach goals and maximize functional L LE strength and ROM for full return to PLOF.   OBJECTIVE IMPAIRMENTS: Abnormal gait, decreased activity tolerance, difficulty walking, decreased ROM, decreased strength, and pain.   ACTIVITY LIMITATIONS: carrying, lifting, sitting, standing, squatting, sleeping, stairs, transfers, bed mobility, bathing, and dressing  PARTICIPATION LIMITATIONS: meal prep, cleaning, laundry, driving, shopping, community activity, and occupation  PERSONAL FACTORS: None   REHAB POTENTIAL: Excellent  CLINICAL DECISION MAKING: Stable/uncomplicated  EVALUATION COMPLEXITY: Low   GOALS: Goals reviewed with patient? Yes  SHORT TERM GOALS: Target date: 03/25/2024   Patient will increase passive right hip flexion  90 degrees Baseline: Goal status: achieved 7/23  2.  Patient will wean off assistive device as tolerated Baseline:  Goal status: achieved 7/23  3.  Patient will be independent with basic HEP Baseline:  Goal status: INITIAL   LONG TERM GOALS: Target date: 05/20/2024    Patient will go up and down 8 steps with reciprocal gait pattern Baseline:  Goal status: INITIAL  2.  Patient will ambulate community distances without increased pain Baseline:  Goal status:  INITIAL  3.  Patient will stand for greater than 1 hour without increased pain in order to perform ADLs Baseline:  Goal status: INITIAL  4.  Patient will return to full exercise routine Baseline:  Goal status: INITIAL    PLAN:  PT FREQUENCY: 2x/week  PT DURATION: 12 weeks  PLANNED INTERVENTIONS: 97110-Therapeutic exercises, 97530- Therapeutic activity, [id number]- Neuromuscular re-education, 97535- Self Care, 02859- Manual therapy, [id number]- Gait training, [phone]- Aquatic Therapy, 97014- Electrical stimulation (unattended), 97035- Ultrasound, Patient/Family education, Stair training, Taping, Dry Needling, DME instructions, Cryotherapy, and Moist heat   PLAN FOR NEXT SESSION:  Begin per hip labral protocol   Dale Call, PT 04/25/2024, 2:47 PM

## 2024-04-28 ENCOUNTER — Encounter (HOSPITAL_BASED_OUTPATIENT_CLINIC_OR_DEPARTMENT_OTHER): Payer: Self-pay | Admitting: Physical Therapy

## 2024-04-28 ENCOUNTER — Ambulatory Visit (HOSPITAL_BASED_OUTPATIENT_CLINIC_OR_DEPARTMENT_OTHER): Admitting: Physical Therapy

## 2024-04-28 DIAGNOSIS — L989 Disorder of the skin and subcutaneous tissue, unspecified: Secondary | ICD-10-CM | POA: Diagnosis not present

## 2024-04-28 DIAGNOSIS — M25552 Pain in left hip: Secondary | ICD-10-CM

## 2024-04-28 DIAGNOSIS — R2689 Other abnormalities of gait and mobility: Secondary | ICD-10-CM

## 2024-04-28 DIAGNOSIS — R29898 Other symptoms and signs involving the musculoskeletal system: Secondary | ICD-10-CM | POA: Diagnosis not present

## 2024-04-29 ENCOUNTER — Encounter (HOSPITAL_BASED_OUTPATIENT_CLINIC_OR_DEPARTMENT_OTHER): Payer: Self-pay | Admitting: Physical Therapy

## 2024-04-29 NOTE — Therapy (Signed)
 OUTPATIENT PHYSICAL THERAPY LOWER EXTREMITY EVALUATION   Patient Name: Jasmine Conrad MRN: [id number] DOB:09-14-1985, 38 y.o., female Today's Date: 04/29/2024  END OF SESSION:  PT End of Session - 04/28/24 1151     Visit Number 15    Number of Visits 24    Date for PT Re-Evaluation 05/20/24    Authorization Type [employer]    PT Start Time 1145    PT Stop Time 1228    PT Time Calculation (min) 43 min    Activity Tolerance Patient tolerated treatment well;No increased pain    Behavior During Therapy WFL for tasks assessed/performed                   Past Medical History:  Diagnosis Date   Depression    Fibroid    Medical history non-contributory    Past Surgical History:  Procedure Laterality Date   CESAREAN SECTION N/A 09/10/2017   Procedure: CESAREAN SECTION;  Surgeon: Dannielle Bouchard, DO;  Location: [facility];  Service: Obstetrics;  Laterality: N/A;  Primary edc 09/16/17 NKDA [name] RNFA   CESAREAN SECTION N/A 08/29/2019   Procedure: CESAREAN SECTION;  Surgeon: Dannielle Bouchard, DO;  Location: [facility];  Service: Obstetrics;  Laterality: N/A;  REPEAT EDC 09/08/19 NKDA [name],  RNFA   WISDOM TOOTH EXTRACTION     Patient Active Problem List   Diagnosis Date Noted   Tear of left acetabular labrum 02/23/2024   Previous cesarean section 08/29/2019   S/P cesarean section 09/10/2017   Trauma during pregnancy 07/03/2017   Left shoulder pain 04/01/2016    PCP: Arnett Repress MD   REFERRING PROVIDER:  Elspeth Parker MD  REFERRING DIAG:  Diagnosis  [id number] (ICD-10-CM) - Tear of left acetabular labrum, initial encounter    THERAPY DIAG:  Left hip pain  Weakness of left hip  Other abnormalities of gait and mobility  Rationale for Evaluation and Treatment: Rehabilitation  ONSET DATE: 02/23/2024 Days since surgery: 65    SUBJECTIVE:   SUBJECTIVE STATEMENT: The patient feels like her ankle is turning out which is effecting her hip. She has a  history of ankle sprains. She has returned to her gym classes for upper body exercises.   Eval: She has a long history of left hip pain.  He trialed conservative therapy with no significant improvement in pain.  On 02/23/2024 she had a left hip labral repair.  Her pain is well-controlled at this time.  She is not using any assistive device.  PERTINENT HISTORY: Nothing pertinent PAIN:  Are you having pain? Yes: NPRS scale: 2 out of 10 consistently Pain location: Anterior hip Pain description: aching  Aggravating factors: prolonged positioning  Relieving factors: medication and ice   PRECAUTIONS: None  RED FLAGS: None   WEIGHT BEARING RESTRICTIONS: No  FALLS:  Has patient fallen in last 6 months? No  LIVING ENVIRONMENT: Has steps at her house   OCCUPATION:  Works for [employer].  Off this week but remote next week.   PLOF: Independent  PATIENT GOALS:    NEXT MD VISIT:  Next week   OBJECTIVE:  Note: Objective measures were completed at Evaluation unless otherwise noted.  DIAGNOSTIC FINDINGS:  Nothing post op   PATIENT SURVEYS:  LEFS  Extreme difficulty/unable (0), Quite a bit of difficulty (1), Moderate difficulty (2), Little difficulty (3), No difficulty (4) Survey date:    Any of your usual work, housework or school activities   2. Usual hobbies, recreational or sporting activities  3. Getting into/out of the bath   4. Walking between rooms   5. Putting on socks/shoes   6. Squatting    7. Lifting an object, like a bag of groceries from the floor   8. Performing light activities around your home   9. Performing heavy activities around your home   10. Getting into/out of a car   11. Walking 2 blocks   12. Walking 1 mile   13. Going up/down 10 stairs (1 flight)   14. Standing for 1 hour   15.  sitting for 1 hour   16. Running on even ground   17. Running on uneven ground   18. Making sharp turns while running fast   19. Hopping    20. Rolling over in bed    Score total:  14/80     COGNITION: Overall cognitive status: Within functional limits for tasks assessed     SENSATION: WFL   POSTURE: No Significant postural limitations  PALPATION: No unexpected TTP  LOWER EXTREMITY ROM:  Passive ROM Right eval Left eval  Hip flexion  90  Hip extension    Hip abduction    Hip adduction    Hip internal rotation    Hip external rotation  18  Knee flexion    Knee extension    Ankle dorsiflexion    Ankle plantarflexion    Ankle inversion    Ankle eversion     (Blank rows = not tested)  LOWER EXTREMITY MMT:  MMT Right eval Left eval  Hip flexion    Hip extension    Hip abduction    Hip adduction    Hip internal rotation    Hip external rotation    Knee flexion    Knee extension    Ankle dorsiflexion    Ankle plantarflexion    Ankle inversion    Ankle eversion     (Blank rows = not tested) not tested 2nd to recent surgery    GAIT: Mild lateral movement with gait Decreased left single leg stance time                                                                                                                               TREATMENT DATE:  8/29 There-ex:  Ankle PF green 2x20  Ankle Eversion green 3x15  Single leg heel raise 3x10  Leg press 3x12 70 lbs  Knee extension 3x12 10 lbs   Neuro-re-ed:  Hop and hold on air-ex fwd and lateral 2x15  Reach drill 2x10 with education on progression    8/25 Mulligan lateral grade III and inf grade III Posterior L hip mob grade IV  Quadruped hip IR/ER self mob 10x 10lb goblet 3x8 (hip ER torque and sitting between hips)   TRX lateral lunge 2x10 TRX reverse lunge 2x10 Standing banded hip ABD 2x10    8/22  There-ex:  Ex bike 5 min L5  Knee extension machine 10  lbs LF 3x12  Cybex leg press 50 lbs 3x12     Neuro-re-ed:  Low amplitude hopping 30 sec  Lateral hop 2x12 each direction  Hop onto air-ex and hold fwd and lateral 2x10   8/18  Recumbent bike warm  up 5 min 2.5  Mulligan lateral grade III and inf grade III Prone figure 4 PA grade III PROM to tolerance  Deep squat progression with UE support 4x5  Rotational RDL/ kickstand RDL 3x8 Runner's step up 8 3x8  Return to gym safety: control for intensity, volume, and no impact/plyometric  8/14 Manual: Manual traction/LAD PROM prone hip IR/ER  Roller to anterior hip   Nuero-re-ed Bridge 3x10  Side lying clamshell 3x10   There-ex: Prone hip extension 3x10   There-act:  Squat 3x10     8/7  Recumbent bike warm up 5 min 2.5  Mulligan lateral grade III and inf grade III STM L hip flexor, quads, adductor, glutes  Half kneeling hip flexor stretch 30s 3x with SB Adductor stretch upright and adductor magnus position 30s 3x Seated piriformis stretch 30s 3x  Exercise/volume management, safety with beach activity, walking volume and symptom irritability  8/4  Recumbent bike warm up 5 min 2.5   Manual traction/LAD Mulligan lateral grade III and inf grade III Prone L hip PA grade III PROM prone hip IR/ER   Supine figure 4 slide 15x Supine 90/90 hip rotation 2x10 6 box step up 2x10 SLS 30s 3x    8/1 Manual traction/LAD Posterior  grade III and inf grade III  Neuro-re-ed:  Side lying clams 3x10  Bridge 3x12   There-ex:  Prone hip extension 3x10  Ex bike L4    There-act:  Step up 3x10 2 inch  Lateral step up 2inch 3x10      7/30 Manual traction/LAD Mulligan lateral grade III and inf grade III  Thomas stretch EOB 30s 3x  Supine groin butterfly 30s 3x  Staggered bridge 3x10 Weighted SB 5lbs 3x10  STS 3x10 (mid thigh height, focus on hip ext/hinge)  Quadruped hip ext 3x10  Bike for cardio, increasing daily step count (avg 7k, can go up to 8k)    7/25 Manual: Grade I inferior glides  Trigger point release to QL, gluteal, and anterior Review of self soft tissue mobilization using thera-cane  There-ex:  PPT 2x15  Prone knee flexion 2x12  Prone  ER/IR 2x12      PATIENT EDUCATION:  Education details: HEP, symptom management  Person educated: Patient Education method: Explanation, Demonstration, Tactile cues, Verbal cues, and Handouts Education comprehension: verbalized understanding, returned demonstration, verbal cues required, tactile cues required, and needs further education  HOME EXERCISE PROGRAM: Access Code: [email or web address] URL: [email or web address] Date: 02/26/2024 Prepared by: Alm Don   ASSESSMENT:  CLINICAL IMPRESSION:   Therapy focused more on ankle stability exercises. She tolerated well. She reported improved ability to do her gym exercises following work on her ankle.   OBJECTIVE IMPAIRMENTS: Abnormal gait, decreased activity tolerance, difficulty walking, decreased ROM, decreased strength, and pain.   ACTIVITY LIMITATIONS: carrying, lifting, sitting, standing, squatting, sleeping, stairs, transfers, bed mobility, bathing, and dressing  PARTICIPATION LIMITATIONS: meal prep, cleaning, laundry, driving, shopping, community activity, and occupation  PERSONAL FACTORS: None   REHAB POTENTIAL: Excellent  CLINICAL DECISION MAKING: Stable/uncomplicated  EVALUATION COMPLEXITY: Low   GOALS: Goals reviewed with patient? Yes  SHORT TERM GOALS: Target date: 03/25/2024   Patient will increase passive right hip flexion 90 degrees Baseline: Goal status: achieved 7/23  2.  Patient will wean off assistive device as tolerated Baseline:  Goal status: achieved 7/23  3.  Patient will be independent with basic HEP Baseline:  Goal status: INITIAL   LONG TERM GOALS: Target date: 05/20/2024    Patient will go up and down 8 steps with reciprocal gait pattern Baseline:  Goal status: INITIAL  2.  Patient will ambulate community distances without increased pain Baseline:  Goal status: INITIAL  3.  Patient will stand for greater than 1 hour without increased pain in order to perform  ADLs Baseline:  Goal status: INITIAL  4.  Patient will return to full exercise routine Baseline:  Goal status: INITIAL    PLAN:  PT FREQUENCY: 2x/week  PT DURATION: 12 weeks  PLANNED INTERVENTIONS: 97110-Therapeutic exercises, 97530- Therapeutic activity, [id number]- Neuromuscular re-education, 97535- Self Care, [id number]- Manual therapy, [phone]- Gait training, [phone]- Aquatic Therapy, 97014- Electrical stimulation (unattended), 97035- Ultrasound, Patient/Family education, Stair training, Taping, Dry Needling, DME instructions, Cryotherapy, and Moist heat   PLAN FOR NEXT SESSION:  Begin per hip labral protocol   Alm JINNY Don, PT 04/29/2024, 10:47 AM

## 2024-05-09 ENCOUNTER — Ambulatory Visit (HOSPITAL_BASED_OUTPATIENT_CLINIC_OR_DEPARTMENT_OTHER): Attending: Family Medicine | Admitting: Physical Therapy

## 2024-05-09 ENCOUNTER — Encounter (HOSPITAL_BASED_OUTPATIENT_CLINIC_OR_DEPARTMENT_OTHER): Payer: Self-pay | Admitting: Physical Therapy

## 2024-05-09 DIAGNOSIS — R29898 Other symptoms and signs involving the musculoskeletal system: Secondary | ICD-10-CM | POA: Insufficient documentation

## 2024-05-09 DIAGNOSIS — M25552 Pain in left hip: Secondary | ICD-10-CM | POA: Insufficient documentation

## 2024-05-09 DIAGNOSIS — R2689 Other abnormalities of gait and mobility: Secondary | ICD-10-CM | POA: Insufficient documentation

## 2024-05-09 NOTE — Therapy (Signed)
 OUTPATIENT PHYSICAL THERAPY LOWER EXTREMITY EVALUATION   Patient Name: Jasmine Conrad MRN: [id number] DOB:February 22, 1986, 38 y.o., female Today's Date: 05/09/2024  END OF SESSION:  PT End of Session - 05/09/24 1452     Visit Number 16    Number of Visits 24    Date for PT Re-Evaluation 05/20/24    Authorization Type [employer]    PT Start Time 1447    PT Stop Time 1527    PT Time Calculation (min) 40 min    Activity Tolerance Patient tolerated treatment well;No increased pain    Behavior During Therapy WFL for tasks assessed/performed                    Past Medical History:  Diagnosis Date   Depression    Fibroid    Medical history non-contributory    Past Surgical History:  Procedure Laterality Date   CESAREAN SECTION N/A 09/10/2017   Procedure: CESAREAN SECTION;  Surgeon: Dannielle Bouchard, DO;  Location: [facility];  Service: Obstetrics;  Laterality: N/A;  Primary edc 09/16/17 NKDA [name] RNFA   CESAREAN SECTION N/A 08/29/2019   Procedure: CESAREAN SECTION;  Surgeon: Dannielle Bouchard, DO;  Location: [facility];  Service: Obstetrics;  Laterality: N/A;  REPEAT EDC 09/08/19 NKDA [name],  RNFA   WISDOM TOOTH EXTRACTION     Patient Active Problem List   Diagnosis Date Noted   Tear of left acetabular labrum 02/23/2024   Previous cesarean section 08/29/2019   S/P cesarean section 09/10/2017   Trauma during pregnancy 07/03/2017   Left shoulder pain 04/01/2016    PCP: Arnett Repress MD   REFERRING PROVIDER:  Elspeth Parker MD  REFERRING DIAG:  Diagnosis  [phone] (ICD-10-CM) - Tear of left acetabular labrum, initial encounter    THERAPY DIAG:  Left hip pain  Weakness of left hip  Other abnormalities of gait and mobility  Rationale for Evaluation and Treatment: Rehabilitation  ONSET DATE: 02/23/2024 Days since surgery: 76    SUBJECTIVE:   SUBJECTIVE STATEMENT:  Pt reports exercise without pain as long as she modified her exercise in classes. [facility].  Pt reports no pain into the hip until she tried a dance with her son at home. No lingering pain.  Eval: She has a long history of left hip pain.  He trialed conservative therapy with no significant improvement in pain.  On 02/23/2024 she had a left hip labral repair.  Her pain is well-controlled at this time.  She is not using any assistive device.  PERTINENT HISTORY: Nothing pertinent PAIN:  Are you having pain? No : NPRS scale: 0 out of 10 consistently Pain location: Anterior hip Pain description: aching  Aggravating factors: prolonged positioning  Relieving factors: medication and ice   PRECAUTIONS: None  RED FLAGS: None   WEIGHT BEARING RESTRICTIONS: No  FALLS:  Has patient fallen in last 6 months? No  LIVING ENVIRONMENT: Has steps at her house   OCCUPATION:  Works for [employer].  Off this week but remote next week.   PLOF: Independent  PATIENT GOALS:    NEXT MD VISIT:  Next week   OBJECTIVE:  Note: Objective measures were completed at Evaluation unless otherwise noted.  DIAGNOSTIC FINDINGS:  Nothing post op   PATIENT SURVEYS:  LEFS  Extreme difficulty/unable (0), Quite a bit of difficulty (1), Moderate difficulty (2), Little difficulty (3), No difficulty (4) Survey date:    Any of your  usual work, housework or school activities   2. Usual hobbies, recreational or sporting activities   3. Getting into/out of the bath   4. Walking between rooms   5. Putting on socks/shoes   6. Squatting    7. Lifting an object, like a bag of groceries from the floor   8. Performing light activities around your home   9. Performing heavy activities around your home   10. Getting into/out of a car   11. Walking 2 blocks   12. Walking 1 mile   13. Going up/down 10 stairs (1 flight)   14. Standing for 1 hour   15.  sitting for 1 hour   16. Running on even ground   17. Running on uneven ground   18. Making sharp turns while  running fast   19. Hopping    20. Rolling over in bed   Score total:  14/80     COGNITION: Overall cognitive status: Within functional limits for tasks assessed     SENSATION: WFL   POSTURE: No Significant postural limitations  PALPATION: No unexpected TTP  LOWER EXTREMITY ROM:  Passive ROM Right eval Left eval  Hip flexion  90  Hip extension    Hip abduction    Hip adduction    Hip internal rotation    Hip external rotation  18  Knee flexion    Knee extension    Ankle dorsiflexion    Ankle plantarflexion    Ankle inversion    Ankle eversion     (Blank rows = not tested)  LOWER EXTREMITY MMT:  MMT Right eval Left eval  Hip flexion    Hip extension    Hip abduction    Hip adduction    Hip internal rotation    Hip external rotation    Knee flexion    Knee extension    Ankle dorsiflexion    Ankle plantarflexion    Ankle inversion    Ankle eversion     (Blank rows = not tested) not tested 2nd to recent surgery    GAIT: Mild lateral movement with gait Decreased left single leg stance time                                                                                                                               TREATMENT DATE:  9/8  Upright bike lvl 5 5 min  25lb goblet 6x, 30lb 2x6 goblet SL RDL 3x8 SL bridge off box [id number] Hip flexor march in standing YTB 3x10 Side plank 2x5 each RTB at knees Standing adductor slider 2x10    8/29 There-ex:  Ankle PF green 2x20  Ankle Eversion green 3x15  Single leg heel raise 3x10  Leg press 3x12 70 lbs  Knee extension 3x12 10 lbs   Neuro-re-ed:  Hop and hold on air-ex fwd and lateral 2x15  Reach drill 2x10 with education on progression    8/25 Mulligan lateral  grade III and inf grade III Posterior L hip mob grade IV  Quadruped hip IR/ER self mob 10x 10lb goblet 3x8 (hip ER torque and sitting between hips)   TRX lateral lunge 2x10 TRX reverse lunge 2x10 Standing banded hip ABD  2x10    8/22  There-ex:  Ex bike 5 min L5  Knee extension machine 10 lbs LF 3x12  Cybex leg press 50 lbs 3x12     Neuro-re-ed:  Low amplitude hopping 30 sec  Lateral hop 2x12 each direction  Hop onto air-ex and hold fwd and lateral 2x10   8/18  Recumbent bike warm up 5 min 2.5  Mulligan lateral grade III and inf grade III Prone figure 4 PA grade III PROM to tolerance  Deep squat progression with UE support 4x5  Rotational RDL/ kickstand RDL 3x8 Runner's step up 8 3x8  Return to gym safety: control for intensity, volume, and no impact/plyometric  8/14 Manual: Manual traction/LAD PROM prone hip IR/ER  Roller to anterior hip   Nuero-re-ed Bridge 3x10  Side lying clamshell 3x10   There-ex: Prone hip extension 3x10   There-act:  Squat 3x10     8/7  Recumbent bike warm up 5 min 2.5  Mulligan lateral grade III and inf grade III STM L hip flexor, quads, adductor, glutes  Half kneeling hip flexor stretch 30s 3x with SB Adductor stretch upright and adductor magnus position 30s 3x Seated piriformis stretch 30s 3x  Exercise/volume management, safety with beach activity, walking volume and symptom irritability  8/4  Recumbent bike warm up 5 min 2.5   Manual traction/LAD Mulligan lateral grade III and inf grade III Prone L hip PA grade III PROM prone hip IR/ER   Supine figure 4 slide 15x Supine 90/90 hip rotation 2x10 6 box step up 2x10 SLS 30s 3x    8/1 Manual traction/LAD Posterior  grade III and inf grade III  Neuro-re-ed:  Side lying clams 3x10  Bridge 3x12   There-ex:  Prone hip extension 3x10  Ex bike L4    There-act:  Step up 3x10 2 inch  Lateral step up 2inch 3x10      7/30 Manual traction/LAD Mulligan lateral grade III and inf grade III  Thomas stretch EOB 30s 3x  Supine groin butterfly 30s 3x  Staggered bridge 3x10 Weighted SB 5lbs 3x10  STS 3x10 (mid thigh height, focus on hip ext/hinge)  Quadruped hip ext  3x10  Bike for cardio, increasing daily step count (avg 7k, can go up to 8k)    7/25 Manual: Grade I inferior glides  Trigger point release to QL, gluteal, and anterior Review of self soft tissue mobilization using thera-cane  There-ex:  PPT 2x15  Prone knee flexion 2x12  Prone ER/IR 2x12      PATIENT EDUCATION:  Education details: HEP, symptom management  Person educated: Patient Education method: Explanation, Demonstration, Tactile cues, Verbal cues, and Handouts Education comprehension: verbalized understanding, returned demonstration, verbal cues required, tactile cues required, and needs further education  HOME EXERCISE PROGRAM: Access Code: [email or web address] URL: [email or web address] Date: 02/26/2024 Prepared by: Alm Don   ASSESSMENT:  CLINICAL IMPRESSION:    Pt 11 wks post op at this time and able to progress exercise intensity in all planes and at deepened degrees of ROM. Pt fatigues quickly with SL exercise but no increase in pain or discomfort at the joint or hip flexor. Hip flexor strengthening added without spasming or pain. HEP updated for accessory lifts and pt advised to continue with  compound lifts in the gym setting or with fitness classes. Plan to continue towards plyos. Plan to strength test at next session to assess for current limb symmetry.   OBJECTIVE IMPAIRMENTS: Abnormal gait, decreased activity tolerance, difficulty walking, decreased ROM, decreased strength, and pain.   ACTIVITY LIMITATIONS: carrying, lifting, sitting, standing, squatting, sleeping, stairs, transfers, bed mobility, bathing, and dressing  PARTICIPATION LIMITATIONS: meal prep, cleaning, laundry, driving, shopping, community activity, and occupation  PERSONAL FACTORS: None   REHAB POTENTIAL: Excellent  CLINICAL DECISION MAKING: Stable/uncomplicated  EVALUATION COMPLEXITY: Low   GOALS: Goals reviewed with patient? Yes  SHORT TERM GOALS: Target date:  03/25/2024   Patient will increase passive right hip flexion 90 degrees Baseline: Goal status: achieved 7/23  2.  Patient will wean off assistive device as tolerated Baseline:  Goal status: achieved 7/23  3.  Patient will be independent with basic HEP Baseline:  Goal status: INITIAL   LONG TERM GOALS: Target date: 05/20/2024    Patient will go up and down 8 steps with reciprocal gait pattern Baseline:  Goal status: INITIAL  2.  Patient will ambulate community distances without increased pain Baseline:  Goal status: INITIAL  3.  Patient will stand for greater than 1 hour without increased pain in order to perform ADLs Baseline:  Goal status: INITIAL  4.  Patient will return to full exercise routine Baseline:  Goal status: INITIAL    PLAN:  PT FREQUENCY: 2x/week  PT DURATION: 12 weeks  PLANNED INTERVENTIONS: 97110-Therapeutic exercises, 97530- Therapeutic activity, [id number]- Neuromuscular re-education, 97535- Self Care, [id number]- Manual therapy, [id number]- Gait training, [phone]- Aquatic Therapy, [id number]- Electrical stimulation (unattended), 97035- Ultrasound, Patient/Family education, Stair training, Taping, Dry Needling, DME instructions, Cryotherapy, and Moist heat   PLAN FOR NEXT SESSION:  Begin per hip labral protocol   Dale Call, PT 05/09/2024, 3:30 PM

## 2024-05-10 DIAGNOSIS — H5213 Myopia, bilateral: Secondary | ICD-10-CM | POA: Diagnosis not present

## 2024-05-11 ENCOUNTER — Ambulatory Visit (HOSPITAL_BASED_OUTPATIENT_CLINIC_OR_DEPARTMENT_OTHER): Admitting: Physical Therapy

## 2024-05-11 ENCOUNTER — Encounter (HOSPITAL_BASED_OUTPATIENT_CLINIC_OR_DEPARTMENT_OTHER): Payer: Self-pay | Admitting: Physical Therapy

## 2024-05-11 DIAGNOSIS — R29898 Other symptoms and signs involving the musculoskeletal system: Secondary | ICD-10-CM

## 2024-05-11 DIAGNOSIS — M25552 Pain in left hip: Secondary | ICD-10-CM

## 2024-05-11 DIAGNOSIS — R2689 Other abnormalities of gait and mobility: Secondary | ICD-10-CM

## 2024-05-11 NOTE — Therapy (Signed)
 OUTPATIENT PHYSICAL THERAPY LOWER EXTREMITY EVALUATION   Patient Name: Jasmine Conrad MRN: [id number] DOB:15-Nov-1985, 38 y.o., female Today's Date: 05/11/2024  END OF SESSION:  PT End of Session - 05/11/24 1308     Visit Number 17    Number of Visits 24    Date for PT Re-Evaluation 05/20/24    Authorization Type [employer]    PT Start Time 1315    PT Stop Time 1358    PT Time Calculation (min) 43 min    Activity Tolerance Patient tolerated treatment well;No increased pain    Behavior During Therapy WFL for tasks assessed/performed                     Past Medical History:  Diagnosis Date   Depression    Fibroid    Medical history non-contributory    Past Surgical History:  Procedure Laterality Date   CESAREAN SECTION N/A 09/10/2017   Procedure: CESAREAN SECTION;  Surgeon: Dannielle Bouchard, DO;  Location: [facility];  Service: Obstetrics;  Laterality: N/A;  Primary edc 09/16/17 NKDA [name] RNFA   CESAREAN SECTION N/A 08/29/2019   Procedure: CESAREAN SECTION;  Surgeon: Dannielle Bouchard, DO;  Location: [facility];  Service: Obstetrics;  Laterality: N/A;  REPEAT EDC 09/08/19 NKDA [name],  RNFA   WISDOM TOOTH EXTRACTION     Patient Active Problem List   Diagnosis Date Noted   Tear of left acetabular labrum 02/23/2024   Previous cesarean section 08/29/2019   S/P cesarean section 09/10/2017   Trauma during pregnancy 07/03/2017   Left shoulder pain 04/01/2016    PCP: Arnett Repress MD   REFERRING PROVIDER:  Elspeth Parker MD  REFERRING DIAG:  Diagnosis  [phone] (ICD-10-CM) - Tear of left acetabular labrum, initial encounter    THERAPY DIAG:  Left hip pain  Weakness of left hip  Other abnormalities of gait and mobility  Rationale for Evaluation and Treatment: Rehabilitation  ONSET DATE: 02/23/2024 Days since surgery: 78    SUBJECTIVE:   SUBJECTIVE STATEMENT:   Pt reports hip stiffness and glutes soreness after last session but no increased  pain since last session.   Eval: She has a long history of left hip pain.  He trialed conservative therapy with no significant improvement in pain.  On 02/23/2024 she had a left hip labral repair.  Her pain is well-controlled at this time.  She is not using any assistive device.  PERTINENT HISTORY: Nothing pertinent PAIN:  Are you having pain? No : NPRS scale: 0 out of 10 consistently Pain location: Anterior hip Pain description: aching  Aggravating factors: prolonged positioning  Relieving factors: medication and ice   PRECAUTIONS: None  RED FLAGS: None   WEIGHT BEARING RESTRICTIONS: No  FALLS:  Has patient fallen in last 6 months? No  LIVING ENVIRONMENT: Has steps at her house   OCCUPATION:  Works for [employer].  Off this week but remote next week.   PLOF: Independent  PATIENT GOALS:    NEXT MD VISIT:  Next week   OBJECTIVE:  Note: Objective measures were completed at Evaluation unless otherwise noted.  DIAGNOSTIC FINDINGS:  Nothing post op   PATIENT SURVEYS:  LEFS  Extreme difficulty/unable (0), Quite a bit of difficulty (1), Moderate difficulty (2), Little difficulty (3), No difficulty (4) Survey date:   9/10  Any of your usual work, housework or school activities  4  2. Usual hobbies, recreational or sporting activities  3  3. Getting into/out of the bath  4  4. Walking between rooms  4  5. Putting on socks/shoes  4  6. Squatting   4  7. Lifting an object, like a bag of groceries from the floor  4  8. Performing light activities around your home  4  9. Performing heavy activities around your home  3  10. Getting into/out of a car  4  11. Walking 2 blocks  4  12. Walking 1 mile  4  13. Going up/down 10 stairs (1 flight)  4  14. Standing for 1 hour  4  15.  sitting for 1 hour  4  16. Running on even ground  3  17. Running on uneven ground  2  18. Making sharp turns while running fast  1  19. Hopping   3  20. Rolling over in bed  4  Score total:  14/80       COGNITION: Overall cognitive status: Within functional limits for tasks assessed     SENSATION: WFL   POSTURE: No Significant postural limitations  PALPATION: No unexpected TTP  LOWER EXTREMITY ROM:  Passive ROM Right eval Left eval L 9/10  Hip flexion  90   Hip extension     Hip abduction     Hip adduction     Hip internal rotation     Hip external rotation  18   Knee flexion     Knee extension     Ankle dorsiflexion     Ankle plantarflexion     Ankle inversion     Ankle eversion      (Blank rows = not tested)  LOWER EXTREMITY MMT:  MMT Right 9/10 Left 9/10  Hip flexion 50.1 49.1  Hip extension 40.6 33.6  Hip abduction 46.6 44.1  Hip adduction 37.9 36.7  Hip internal rotation 20.7 15.0  Hip external rotation 13.5 19.4  Knee flexion    Knee extension 40.3 61.8  Ankle dorsiflexion    Ankle plantarflexion    Ankle inversion    Ankle eversion     (Blank rows = not tested)   GAIT: WFL  Step down test: 20x L and R ( not passed on L)                                                                                                                                TREATMENT DATE:  9/10  Recumbent bike warm up level 3  Strength testing and edu of strength test results  DL jumping 69d ML and AP  HEP updates, gym safety  Mulligan lateral grade III and inf grade III Posterior L hip mob grade IV STM L glute    9/8  Upright bike lvl 5 5 min  25lb goblet 6x, 30lb 2x6 goblet SL RDL 3x8 SL bridge off box [id number] Hip flexor march in standing YTB 3x10 Side plank 2x5 each RTB at knees Standing adductor slider 2x10    8/29  There-ex:  Ankle PF green 2x20  Ankle Eversion green 3x15  Single leg heel raise 3x10  Leg press 3x12 70 lbs  Knee extension 3x12 10 lbs   Neuro-re-ed:  Hop and hold on air-ex fwd and lateral 2x15  Reach drill 2x10 with education on progression    8/25 Mulligan lateral grade III and inf grade III Posterior L hip mob  grade IV  Quadruped hip IR/ER self mob 10x 10lb goblet 3x8 (hip ER torque and sitting between hips)   TRX lateral lunge 2x10 TRX reverse lunge 2x10 Standing banded hip ABD 2x10    8/22  There-ex:  Ex bike 5 min L5  Knee extension machine 10 lbs LF 3x12  Cybex leg press 50 lbs 3x12     Neuro-re-ed:  Low amplitude hopping 30 sec  Lateral hop 2x12 each direction  Hop onto air-ex and hold fwd and lateral 2x10   8/18  Recumbent bike warm up 5 min 2.5  Mulligan lateral grade III and inf grade III Prone figure 4 PA grade III PROM to tolerance  Deep squat progression with UE support 4x5  Rotational RDL/ kickstand RDL 3x8 Runner's step up 8 3x8  Return to gym safety: control for intensity, volume, and no impact/plyometric  8/14 Manual: Manual traction/LAD PROM prone hip IR/ER  Roller to anterior hip   Nuero-re-ed Bridge 3x10  Side lying clamshell 3x10   There-ex: Prone hip extension 3x10   There-act:  Squat 3x10     8/7  Recumbent bike warm up 5 min 2.5  Mulligan lateral grade III and inf grade III STM L hip flexor, quads, adductor, glutes  Half kneeling hip flexor stretch 30s 3x with SB Adductor stretch upright and adductor magnus position 30s 3x Seated piriformis stretch 30s 3x  Exercise/volume management, safety with beach activity, walking volume and symptom irritability  8/4  Recumbent bike warm up 5 min 2.5   Manual traction/LAD Mulligan lateral grade III and inf grade III Prone L hip PA grade III PROM prone hip IR/ER   Supine figure 4 slide 15x Supine 90/90 hip rotation 2x10 6 box step up 2x10 SLS 30s 3x    8/1 Manual traction/LAD Posterior  grade III and inf grade III  Neuro-re-ed:  Side lying clams 3x10  Bridge 3x12   There-ex:  Prone hip extension 3x10  Ex bike L4    There-act:  Step up 3x10 2 inch  Lateral step up 2inch 3x10      7/30 Manual traction/LAD Mulligan lateral grade III and inf grade III  Thomas  stretch EOB 30s 3x  Supine groin butterfly 30s 3x  Staggered bridge 3x10 Weighted SB 5lbs 3x10  STS 3x10 (mid thigh height, focus on hip ext/hinge)  Quadruped hip ext 3x10  Bike for cardio, increasing daily step count (avg 7k, can go up to 8k)    7/25 Manual: Grade I inferior glides  Trigger point release to QL, gluteal, and anterior Review of self soft tissue mobilization using thera-cane  There-ex:  PPT 2x15  Prone knee flexion 2x12  Prone ER/IR 2x12      PATIENT EDUCATION:  Education details: HEP, symptom management  Person educated: Patient Education method: Explanation, Demonstration, Tactile cues, Verbal cues, and Handouts Education comprehension: verbalized understanding, returned demonstration, verbal cues required, tactile cues required, and needs further education  HOME EXERCISE PROGRAM: Access Code: [email or web address] URL: [email or web address] Date: 02/26/2024 Prepared by: Alm Don   ASSESSMENT:  CLINICAL IMPRESSION:    Pt 11 wks post op  at this time and objective and subjective measures show signficant improvement. Pt is still limited with hip IR as well as hip extension strength. Initial HHD measures do largely show 80% strength of the non-surgical limb. However, SL functional testing does show continued lack of stability, strength, and muscle endurance. Pt advised she may start low amplitude and intensity DL jumping but to avoid running or SL hopping at this time. Plan to continue with progressive strengthening with focus on more SL activity to aid in return to running.   OBJECTIVE IMPAIRMENTS: Abnormal gait, decreased activity tolerance, difficulty walking, decreased ROM, decreased strength, and pain.   ACTIVITY LIMITATIONS: carrying, lifting, sitting, standing, squatting, sleeping, stairs, transfers, bed mobility, bathing, and dressing  PARTICIPATION LIMITATIONS: meal prep, cleaning, laundry, driving, shopping, community activity, and  occupation  PERSONAL FACTORS: None   REHAB POTENTIAL: Excellent  CLINICAL DECISION MAKING: Stable/uncomplicated  EVALUATION COMPLEXITY: Low   GOALS: Goals reviewed with patient? Yes  SHORT TERM GOALS: Target date: 03/25/2024   Patient will increase passive right hip flexion 90 degrees Baseline: Goal status: achieved 7/23  2.  Patient will wean off assistive device as tolerated Baseline:  Goal status: achieved 7/23  3.  Patient will be independent with basic HEP Baseline:  Goal status: INITIAL   LONG TERM GOALS: Target date: 05/20/2024    Patient will go up and down 8 steps with reciprocal gait pattern Baseline:  Goal status: INITIAL  2.  Patient will ambulate community distances without increased pain Baseline:  Goal status: INITIAL  3.  Patient will stand for greater than 1 hour without increased pain in order to perform ADLs Baseline:  Goal status: INITIAL  4.  Patient will return to full exercise routine Baseline:  Goal status: INITIAL    PLAN:  PT FREQUENCY: 2x/week  PT DURATION: 12 weeks  PLANNED INTERVENTIONS: 97110-Therapeutic exercises, 97530- Therapeutic activity, [id number]- Neuromuscular re-education, 97535- Self Care, [id number]- Manual therapy, [id number]- Gait training, [phone]- Aquatic Therapy, 97014- Electrical stimulation (unattended), 97035- Ultrasound, Patient/Family education, Stair training, Taping, Dry Needling, DME instructions, Cryotherapy, and Moist heat   PLAN FOR NEXT SESSION:  Begin per hip labral protocol   Dale Call, PT 05/11/2024, 2:52 PM

## 2024-05-16 ENCOUNTER — Encounter (HOSPITAL_BASED_OUTPATIENT_CLINIC_OR_DEPARTMENT_OTHER): Payer: Self-pay | Admitting: Physical Therapy

## 2024-05-16 ENCOUNTER — Ambulatory Visit (HOSPITAL_BASED_OUTPATIENT_CLINIC_OR_DEPARTMENT_OTHER): Admitting: Physical Therapy

## 2024-05-16 DIAGNOSIS — R2689 Other abnormalities of gait and mobility: Secondary | ICD-10-CM | POA: Diagnosis not present

## 2024-05-16 DIAGNOSIS — M25552 Pain in left hip: Secondary | ICD-10-CM

## 2024-05-16 DIAGNOSIS — R29898 Other symptoms and signs involving the musculoskeletal system: Secondary | ICD-10-CM | POA: Diagnosis not present

## 2024-05-16 NOTE — Therapy (Signed)
 OUTPATIENT PHYSICAL THERAPY LOWER EXTREMITY EVALUATION   Patient Name: Jasmine Conrad MRN: [id number] DOB:[birth year], 38 y.o., female Today's Date: 05/16/2024  END OF SESSION:  PT End of Session - 05/16/24 1405     Visit Number 18    Number of Visits 24    Date for PT Re-Evaluation 05/20/24    Authorization Type [employer]    PT Start Time 1401    PT Stop Time 1443    PT Time Calculation (min) 42 min    Activity Tolerance Patient tolerated treatment well;No increased pain    Behavior During Therapy WFL for tasks assessed/performed                      Past Medical History:  Diagnosis Date   Depression    Fibroid    Medical history non-contributory    Past Surgical History:  Procedure Laterality Date   CESAREAN SECTION N/A 09/10/2017   Procedure: CESAREAN SECTION;  Surgeon: Dannielle Bouchard, DO;  Location: [facility];  Service: Obstetrics;  Laterality: N/A;  Primary edc 09/16/17 NKDA [name] RNFA   CESAREAN SECTION N/A 08/29/2019   Procedure: CESAREAN SECTION;  Surgeon: Dannielle Bouchard, DO;  Location: [facility];  Service: Obstetrics;  Laterality: N/A;  REPEAT EDC 09/08/19 NKDA [name],  RNFA   WISDOM TOOTH EXTRACTION     Patient Active Problem List   Diagnosis Date Noted   Tear of left acetabular labrum 02/23/2024   Previous cesarean section 08/29/2019   S/P cesarean section 09/10/2017   Trauma during pregnancy 07/03/2017   Left shoulder pain 04/01/2016    PCP: Arnett Repress MD   REFERRING PROVIDER:  Elspeth Parker MD  REFERRING DIAG:  Diagnosis  [phone] (ICD-10-CM) - Tear of left acetabular labrum, initial encounter    THERAPY DIAG:  Left hip pain  Weakness of left hip  Other abnormalities of gait and mobility  Rationale for Evaluation and Treatment: Rehabilitation  ONSET DATE: 02/23/2024 Days since surgery: 83    SUBJECTIVE:   SUBJECTIVE STATEMENT:   Pt reports no issues from last session. Pt did return to an ISI workout  without pain when focusing on even weight distribution.   Eval: She has a long history of left hip pain.  He trialed conservative therapy with no significant improvement in pain.  On 02/23/2024 she had a left hip labral repair.  Her pain is well-controlled at this time.  She is not using any assistive device.  PERTINENT HISTORY: Nothing pertinent PAIN:  Are you having pain? No : NPRS scale: 0 out of 10 consistently Pain location: Anterior hip Pain description: aching  Aggravating factors: prolonged positioning  Relieving factors: medication and ice   PRECAUTIONS: None  RED FLAGS: None   WEIGHT BEARING RESTRICTIONS: No  FALLS:  Has patient fallen in last 6 months? No  LIVING ENVIRONMENT: Has steps at her house   OCCUPATION:  Works for [employer].  Off this week but remote next week.   PLOF: Independent  PATIENT GOALS:    NEXT MD VISIT:  Next week   OBJECTIVE:  Note: Objective measures were completed at Evaluation unless otherwise noted.  DIAGNOSTIC FINDINGS:  Nothing post op   PATIENT SURVEYS:  LEFS  Extreme difficulty/unable (0), Quite a bit of difficulty (1), Moderate difficulty (2), Little difficulty (3), No difficulty (4) Survey date:   9/10  Any of your usual work, housework or school activities  4  2. Usual hobbies, recreational or sporting activities  3  3. Getting into/out of the bath  4  4. Walking between rooms  4  5. Putting on socks/shoes  4  6. Squatting   4  7. Lifting an object, like a bag of groceries from the floor  4  8. Performing light activities around your home  4  9. Performing heavy activities around your home  3  10. Getting into/out of a car  4  11. Walking 2 blocks  4  12. Walking 1 mile  4  13. Going up/down 10 stairs (1 flight)  4  14. Standing for 1 hour  4  15.  sitting for 1 hour  4  16. Running on even ground  3  17. Running on uneven ground  2  18. Making sharp turns while running fast  1  19. Hopping   3  20. Rolling over  in bed  4  Score total:  14/80      COGNITION: Overall cognitive status: Within functional limits for tasks assessed     SENSATION: WFL   POSTURE: No Significant postural limitations  PALPATION: No unexpected TTP  LOWER EXTREMITY ROM:  Passive ROM Right eval Left eval L 9/10  Hip flexion  90   Hip extension     Hip abduction     Hip adduction     Hip internal rotation     Hip external rotation  18   Knee flexion     Knee extension     Ankle dorsiflexion     Ankle plantarflexion     Ankle inversion     Ankle eversion      (Blank rows = not tested)  LOWER EXTREMITY MMT:  MMT Right 9/10 Left 9/10  Hip flexion 50.1 49.1  Hip extension 40.6 33.6  Hip abduction 46.6 44.1  Hip adduction 37.9 36.7  Hip internal rotation 20.7 15.0  Hip external rotation 13.5 19.4  Knee flexion    Knee extension 40.3 61.8  Ankle dorsiflexion    Ankle plantarflexion    Ankle inversion    Ankle eversion     (Blank rows = not tested)   GAIT: WFL  Step down test: 20x L and R ( not passed on L)                                                                                                                                TREATMENT DATE:  9/15 Recumbent bike warm up level 3  Pendulum lunge 3x10 Hip thrust 3x8 40lbs  Weighted bench step up 10lbs each hand 2x16 Captain morgan 5s 2x5 DL to SL light tier plyo progression: DL hop to scissor hop 2x3 ea Deep lateral step down 3x8  9/10  Recumbent bike warm up level 3  Strength testing and edu of strength test results  DL jumping 69d ML and AP  HEP updates, gym safety  Mulligan lateral grade III and inf grade III Posterior L  hip mob grade IV STM L glute    9/8  Upright bike lvl 5 5 min  25lb goblet 6x, 30lb 2x6 goblet SL RDL 3x8 SL bridge off box [id number] Hip flexor march in standing YTB 3x10 Side plank 2x5 each RTB at knees Standing adductor slider 2x10    8/29 There-ex:  Ankle PF green 2x20  Ankle Eversion  green 3x15  Single leg heel raise 3x10  Leg press 3x12 70 lbs  Knee extension 3x12 10 lbs   Neuro-re-ed:  Hop and hold on air-ex fwd and lateral 2x15  Reach drill 2x10 with education on progression    8/25 Mulligan lateral grade III and inf grade III Posterior L hip mob grade IV  Quadruped hip IR/ER self mob 10x 10lb goblet 3x8 (hip ER torque and sitting between hips)   TRX lateral lunge 2x10 TRX reverse lunge 2x10 Standing banded hip ABD 2x10    8/22  There-ex:  Ex bike 5 min L5  Knee extension machine 10 lbs LF 3x12  Cybex leg press 50 lbs 3x12     Neuro-re-ed:  Low amplitude hopping 30 sec  Lateral hop 2x12 each direction  Hop onto air-ex and hold fwd and lateral 2x10   8/18  Recumbent bike warm up 5 min 2.5  Mulligan lateral grade III and inf grade III Prone figure 4 PA grade III PROM to tolerance  Deep squat progression with UE support 4x5  Rotational RDL/ kickstand RDL 3x8 Runner's step up 8 3x8  Return to gym safety: control for intensity, volume, and no impact/plyometric  8/14 Manual: Manual traction/LAD PROM prone hip IR/ER  Roller to anterior hip   Nuero-re-ed Bridge 3x10  Side lying clamshell 3x10   There-ex: Prone hip extension 3x10   There-act:  Squat 3x10     8/7  Recumbent bike warm up 5 min 2.5  Mulligan lateral grade III and inf grade III STM L hip flexor, quads, adductor, glutes  Half kneeling hip flexor stretch 30s 3x with SB Adductor stretch upright and adductor magnus position 30s 3x Seated piriformis stretch 30s 3x  Exercise/volume management, safety with beach activity, walking volume and symptom irritability  8/4  Recumbent bike warm up 5 min 2.5   Manual traction/LAD Mulligan lateral grade III and inf grade III Prone L hip PA grade III PROM prone hip IR/ER   Supine figure 4 slide 15x Supine 90/90 hip rotation 2x10 6 box step up 2x10 SLS 30s 3x    8/1 Manual traction/LAD Posterior  grade III and  inf grade III  Neuro-re-ed:  Side lying clams 3x10  Bridge 3x12   There-ex:  Prone hip extension 3x10  Ex bike L4    There-act:  Step up 3x10 2 inch  Lateral step up 2inch 3x10      7/30 Manual traction/LAD Mulligan lateral grade III and inf grade III  Thomas stretch EOB 30s 3x  Supine groin butterfly 30s 3x  Staggered bridge 3x10 Weighted SB 5lbs 3x10  STS 3x10 (mid thigh height, focus on hip ext/hinge)  Quadruped hip ext 3x10  Bike for cardio, increasing daily step count (avg 7k, can go up to 8k)    7/25 Manual: Grade I inferior glides  Trigger point release to QL, gluteal, and anterior Review of self soft tissue mobilization using thera-cane  There-ex:  PPT 2x15  Prone knee flexion 2x12  Prone ER/IR 2x12      PATIENT EDUCATION:  Education details: HEP, symptom management  Person educated: Patient  Education method: Explanation, Demonstration, Tactile cues, Verbal cues, and Handouts Education comprehension: verbalized understanding, returned demonstration, verbal cues required, tactile cues required, and needs further education  HOME EXERCISE PROGRAM: Access Code: [email or web address] URL: [email or web address] Date: 02/26/2024 Prepared by: Alm Don   ASSESSMENT:  CLINICAL IMPRESSION:    Pt 11.5 wks post op at this time. Pt progression of hip extension strength, SL motor control, and unassisted CKC exercise today without discomfort or pain. Volume limited given increase in intensity but all exercise progressed for deeper degrees of hip and knee flexion. Pt adductor strength no progressed. Consider trial of short level copenhagens at next. Plan to progress SL strength for starting of return to running program. Pt progressing well with strength and functional SL activity. Pt would benefit from continued skilled therapy in order to reach goals and maximize functional L hip strength and ROM for full return to PLOF.    OBJECTIVE IMPAIRMENTS:  Abnormal gait, decreased activity tolerance, difficulty walking, decreased ROM, decreased strength, and pain.   ACTIVITY LIMITATIONS: carrying, lifting, sitting, standing, squatting, sleeping, stairs, transfers, bed mobility, bathing, and dressing  PARTICIPATION LIMITATIONS: meal prep, cleaning, laundry, driving, shopping, community activity, and occupation  PERSONAL FACTORS: None   REHAB POTENTIAL: Excellent  CLINICAL DECISION MAKING: Stable/uncomplicated  EVALUATION COMPLEXITY: Low   GOALS: Goals reviewed with patient? Yes  SHORT TERM GOALS: Target date: 03/25/2024   Patient will increase passive right hip flexion 90 degrees Baseline: Goal status: achieved 7/23  2.  Patient will wean off assistive device as tolerated Baseline:  Goal status: achieved 7/23  3.  Patient will be independent with basic HEP Baseline:  Goal status: INITIAL   LONG TERM GOALS: Target date: 05/20/2024    Patient will go up and down 8 steps with reciprocal gait pattern Baseline:  Goal status: INITIAL  2.  Patient will ambulate community distances without increased pain Baseline:  Goal status: INITIAL  3.  Patient will stand for greater than 1 hour without increased pain in order to perform ADLs Baseline:  Goal status: INITIAL  4.  Patient will return to full exercise routine Baseline:  Goal status: INITIAL    PLAN:  PT FREQUENCY: 2x/week  PT DURATION: 12 weeks  PLANNED INTERVENTIONS: 97110-Therapeutic exercises, 97530- Therapeutic activity, [id number]- Neuromuscular re-education, 97535- Self Care, [id number]- Manual therapy, [id number]- Gait training, [phone]- Aquatic Therapy, 97014- Electrical stimulation (unattended), 97035- Ultrasound, Patient/Family education, Stair training, Taping, Dry Needling, DME instructions, Cryotherapy, and Moist heat   PLAN FOR NEXT SESSION:  Begin per hip labral protocol   Dale Call, PT 05/16/2024, 2:45 PM

## 2024-05-18 ENCOUNTER — Ambulatory Visit (HOSPITAL_BASED_OUTPATIENT_CLINIC_OR_DEPARTMENT_OTHER): Admitting: Orthopaedic Surgery

## 2024-05-18 ENCOUNTER — Encounter (HOSPITAL_BASED_OUTPATIENT_CLINIC_OR_DEPARTMENT_OTHER): Payer: Self-pay | Admitting: Physical Therapy

## 2024-05-18 ENCOUNTER — Ambulatory Visit (HOSPITAL_BASED_OUTPATIENT_CLINIC_OR_DEPARTMENT_OTHER): Admitting: Physical Therapy

## 2024-05-18 DIAGNOSIS — R29898 Other symptoms and signs involving the musculoskeletal system: Secondary | ICD-10-CM

## 2024-05-18 DIAGNOSIS — M25552 Pain in left hip: Secondary | ICD-10-CM

## 2024-05-18 DIAGNOSIS — S73192A Other sprain of left hip, initial encounter: Secondary | ICD-10-CM

## 2024-05-18 DIAGNOSIS — R2689 Other abnormalities of gait and mobility: Secondary | ICD-10-CM

## 2024-05-18 NOTE — Progress Notes (Signed)
 Post Operative Evaluation    Procedure/Date of Surgery: Left hip arthroscopy with labral repair 6/24  Interval History:   Presents 12 weeks today.  Overall she is doing much better after her hip injection.  She is continuing to improve.   PMH/PSH/Family History/Social History/Meds/Allergies:    Past Medical History:  Diagnosis Date   Depression    Fibroid    Medical history non-contributory    Past Surgical History:  Procedure Laterality Date   CESAREAN SECTION N/A 09/10/2017   Procedure: CESAREAN SECTION;  Surgeon: Dannielle Bouchard, DO;  Location: [facility];  Service: Obstetrics;  Laterality: N/A;  Primary edc 09/16/17 NKDA [name] RNFA   CESAREAN SECTION N/A 08/29/2019   Procedure: CESAREAN SECTION;  Surgeon: Dannielle Bouchard, DO;  Location: [facility];  Service: Obstetrics;  Laterality: N/A;  REPEAT EDC 09/08/19 NKDA [name],  RNFA   WISDOM TOOTH EXTRACTION     Social History   Socioeconomic History   Marital status: Married    Spouse name: Not on file   Number of children: Not on file   Years of education: Not on file   Highest education level: Not on file  Occupational History   Not on file  Tobacco Use   Smoking status: Never   Smokeless tobacco: Never  Vaping Use   Vaping status: Never Used  Substance and Sexual Activity   Alcohol use: No    Comment: not since pregnancy   Drug use: No   Sexual activity: Not Currently    Comment: due to pain asssociated with intercourse   Other Topics Concern   Not on file  Social History Narrative   Not on file   Social Drivers of Health   Financial Resource Strain: Not on file  Food Insecurity: Not on file  Transportation Needs: Not on file  Physical Activity: Not on file  Stress: Not on file  Social Connections: Not on file   Family History  Problem Relation Age of Onset   Hypertension Paternal Grandmother    Cancer Paternal Grandmother    Cancer Paternal Grandfather     Cancer Maternal Grandmother    Kidney Stones Mother    Diabetes Paternal Uncle    Allergies  Allergen Reactions   Sulfamethoxazole -Trimethoprim  Rash   Current Outpatient Medications  Medication Sig Dispense Refill   aspirin  EC 325 MG tablet Take 1 tablet (325 mg total) by mouth daily. 14 tablet 0   LORazepam  (ATIVAN ) 0.5 MG tablet Take 1 tablet by mouth 30-60 minutes prior to procedure, can repeat dose once at time of procedure if needed. 2 tablet 0   metroNIDAZOLE  (METROGEL ) 0.75 % gel Apply 1 Application topically daily. 45 g 0   oxyCODONE  (ROXICODONE ) 5 MG immediate release tablet Take 1 tablet (5 mg total) by mouth every 4 (four) hours as needed for severe pain (pain score 7-10) or breakthrough pain. 10 tablet 0   No current facility-administered medications for this visit.   No results found.  Review of Systems:   A ROS was performed including pertinent positives and negatives as documented in the HPI.   Musculoskeletal Exam:      Left hip incisions are well-appearing without erythema or drainage.  Walks with a normal gait.  30 degrees internal/external rotation without pain.  Good abduction strength  Imaging:  I personally reviewed and interpreted the radiographs.   Assessment:   12 weeks status post left hip arthroscopic labral repair doing well.  Overall she is doing much better after injection.  At this time we will plan to see her back once more in 3 months Plan :    - Return to clinic 3 months   I personally saw and evaluated the patient, and participated in the management and treatment plan.  Elspeth Parker, MD Attending Physician, Orthopedic Surgery  This document was dictated using Dragon voice recognition software. A reasonable attempt at proof reading has been made to minimize errors.

## 2024-05-18 NOTE — Therapy (Signed)
 OUTPATIENT PHYSICAL THERAPY LOWER EXTREMITY EVALUATION   Patient Name: Jasmine Conrad MRN: [id number] DOB:17-Aug-1986, 38 y.o., female Today's Date: 05/18/2024  END OF SESSION:  PT End of Session - 05/18/24 1519     Visit Number 19    Number of Visits 24    Date for PT Re-Evaluation 05/20/24    Authorization Type [employer]    PT Start Time 1515    PT Stop Time 1558    PT Time Calculation (min) 43 min    Activity Tolerance Patient tolerated treatment well;No increased pain    Behavior During Therapy WFL for tasks assessed/performed                      Past Medical History:  Diagnosis Date   Depression    Fibroid    Medical history non-contributory    Past Surgical History:  Procedure Laterality Date   CESAREAN SECTION N/A 09/10/2017   Procedure: CESAREAN SECTION;  Surgeon: Dannielle Bouchard, DO;  Location: [facility];  Service: Obstetrics;  Laterality: N/A;  Primary edc 09/16/17 NKDA [name] RNFA   CESAREAN SECTION N/A 08/29/2019   Procedure: CESAREAN SECTION;  Surgeon: Dannielle Bouchard, DO;  Location: [facility];  Service: Obstetrics;  Laterality: N/A;  REPEAT EDC 09/08/19 NKDA [name],  RNFA   WISDOM TOOTH EXTRACTION     Patient Active Problem List   Diagnosis Date Noted   Tear of left acetabular labrum 02/23/2024   Previous cesarean section 08/29/2019   S/P cesarean section 09/10/2017   Trauma during pregnancy 07/03/2017   Left shoulder pain 04/01/2016    PCP: Arnett Repress MD   REFERRING PROVIDER:  Elspeth Parker MD  REFERRING DIAG:  Diagnosis  [phone] (ICD-10-CM) - Tear of left acetabular labrum, initial encounter    THERAPY DIAG:  Left hip pain  Weakness of left hip  Other abnormalities of gait and mobility  Rationale for Evaluation and Treatment: Rehabilitation  ONSET DATE: 02/23/2024 Days since surgery: 85    SUBJECTIVE:   SUBJECTIVE STATEMENT:   The patient reported soreness into her hamstrings after the last visit but  that is the area she worked.  Eval: She has a long history of left hip pain.  He trialed conservative therapy with no significant improvement in pain.  On 02/23/2024 she had a left hip labral repair.  Her pain is well-controlled at this time.  She is not using any assistive device.  PERTINENT HISTORY: Nothing pertinent PAIN:  Are you having pain? No : NPRS scale: 0 out of 10 consistently Pain location: Anterior hip Pain description: aching  Aggravating factors: prolonged positioning  Relieving factors: medication and ice   PRECAUTIONS: None  RED FLAGS: None   WEIGHT BEARING RESTRICTIONS: No  FALLS:  Has patient fallen in last 6 months? No  LIVING ENVIRONMENT: Has steps at her house   OCCUPATION:  Works for [employer].  Off this week but remote next week.   PLOF: Independent  PATIENT GOALS:    NEXT MD VISIT:  Next week   OBJECTIVE:  Note: Objective measures were completed at Evaluation unless otherwise noted.  DIAGNOSTIC FINDINGS:  Nothing post op   PATIENT SURVEYS:  LEFS  Extreme difficulty/unable (0), Quite a bit of difficulty (1), Moderate difficulty (2), Little difficulty (3), No difficulty (4) Survey date:   9/10  Any of your usual work, housework or school activities  4  2. Usual hobbies, recreational or sporting activities  3  3. Getting into/out of the  bath  4  4. Walking between rooms  4  5. Putting on socks/shoes  4  6. Squatting   4  7. Lifting an object, like a bag of groceries from the floor  4  8. Performing light activities around your home  4  9. Performing heavy activities around your home  3  10. Getting into/out of a car  4  11. Walking 2 blocks  4  12. Walking 1 mile  4  13. Going up/down 10 stairs (1 flight)  4  14. Standing for 1 hour  4  15.  sitting for 1 hour  4  16. Running on even ground  3  17. Running on uneven ground  2  18. Making sharp turns while running fast  1  19. Hopping   3  20. Rolling over in bed  4  Score total:   14/80      COGNITION: Overall cognitive status: Within functional limits for tasks assessed     SENSATION: WFL   POSTURE: No Significant postural limitations  PALPATION: No unexpected TTP  LOWER EXTREMITY ROM:  Passive ROM Right eval Left eval L 9/10  Hip flexion  90   Hip extension     Hip abduction     Hip adduction     Hip internal rotation     Hip external rotation  18   Knee flexion     Knee extension     Ankle dorsiflexion     Ankle plantarflexion     Ankle inversion     Ankle eversion      (Blank rows = not tested)  LOWER EXTREMITY MMT:  MMT Right 9/10 Left 9/10  Hip flexion 50.1 49.1  Hip extension 40.6 33.6  Hip abduction 46.6 44.1  Hip adduction 37.9 36.7  Hip internal rotation 20.7 15.0  Hip external rotation 13.5 19.4  Knee flexion    Knee extension 40.3 61.8  Ankle dorsiflexion    Ankle plantarflexion    Ankle inversion    Ankle eversion     (Blank rows = not tested)   GAIT: WFL  Step down test: 20x L and R ( not passed on L)                                                                                                                                TREATMENT DATE:  9/15 Recumbent bike warm up level 3  Pendulum lunge 3x10 Hip thrust 3x8 40lbs  Weighted bench step up 10lbs each hand 2x16 Captain morgan 5s 2x5 DL to SL light tier plyo progression: DL hop to scissor hop 2x3 ea Deep lateral step down 3x8  9/10  Recumbent bike warm up level 3  Strength testing and edu of strength test results  DL jumping 69d ML and AP  HEP updates, gym safety  Mulligan lateral grade III and inf grade III Posterior L hip mob grade IV STM  L glute    9/8  Upright bike lvl 5 5 min  25lb goblet 6x, 30lb 2x6 goblet SL RDL 3x8 SL bridge off box 7k89 Hip flexor march in standing YTB 3x10 Side plank 2x5 each RTB at knees Standing adductor slider 2x10    8/29 There-ex:  Ankle PF green 2x20  Ankle Eversion green 3x15  Single leg heel  raise 3x10  Leg press 3x12 70 lbs  Knee extension 3x12 10 lbs   Neuro-re-ed:  Hop and hold on air-ex fwd and lateral 2x15  Reach drill 2x10 with education on progression    8/25 Mulligan lateral grade III and inf grade III Posterior L hip mob grade IV  Quadruped hip IR/ER self mob 10x 10lb goblet 3x8 (hip ER torque and sitting between hips)   TRX lateral lunge 2x10 TRX reverse lunge 2x10 Standing banded hip ABD 2x10    8/22  There-ex:  Ex bike 5 min L5  Knee extension machine 10 lbs LF 3x12  Cybex leg press 50 lbs 3x12     Neuro-re-ed:  Low amplitude hopping 30 sec  Lateral hop 2x12 each direction  Hop onto air-ex and hold fwd and lateral 2x10   8/18  Recumbent bike warm up 5 min 2.5  Mulligan lateral grade III and inf grade III Prone figure 4 PA grade III PROM to tolerance  Deep squat progression with UE support 4x5  Rotational RDL/ kickstand RDL 3x8 Runner's step up 8 3x8  Return to gym safety: control for intensity, volume, and no impact/plyometric  8/14 Manual: Manual traction/LAD PROM prone hip IR/ER  Roller to anterior hip   Nuero-re-ed Bridge 3x10  Side lying clamshell 3x10   There-ex: Prone hip extension 3x10   There-act:  Squat 3x10     8/7  Recumbent bike warm up 5 min 2.5  Mulligan lateral grade III and inf grade III STM L hip flexor, quads, adductor, glutes  Half kneeling hip flexor stretch 30s 3x with SB Adductor stretch upright and adductor magnus position 30s 3x Seated piriformis stretch 30s 3x  Exercise/volume management, safety with beach activity, walking volume and symptom irritability  8/4  Recumbent bike warm up 5 min 2.5   Manual traction/LAD Mulligan lateral grade III and inf grade III Prone L hip PA grade III PROM prone hip IR/ER   Supine figure 4 slide 15x Supine 90/90 hip rotation 2x10 6 box step up 2x10 SLS 30s 3x    8/1 Manual traction/LAD Posterior  grade III and inf grade III  Neuro-re-ed:   Side lying clams 3x10  Bridge 3x12   There-ex:  Prone hip extension 3x10  Ex bike L4    There-act:  Step up 3x10 2 inch  Lateral step up 2inch 3x10      7/30 Manual traction/LAD Mulligan lateral grade III and inf grade III  Thomas stretch EOB 30s 3x  Supine groin butterfly 30s 3x  Staggered bridge 3x10 Weighted SB 5lbs 3x10  STS 3x10 (mid thigh height, focus on hip ext/hinge)  Quadruped hip ext 3x10  Bike for cardio, increasing daily step count (avg 7k, can go up to 8k)    7/25 Manual: Grade I inferior glides  Trigger point release to QL, gluteal, and anterior Review of self soft tissue mobilization using thera-cane  There-ex:  PPT 2x15  Prone knee flexion 2x12  Prone ER/IR 2x12      PATIENT EDUCATION:  Education details: HEP, symptom management  Person educated: Patient Education method: Explanation, Demonstration, Tactile  cues, Verbal cues, and Handouts Education comprehension: verbalized understanding, returned demonstration, verbal cues required, tactile cues required, and needs further education  HOME EXERCISE PROGRAM: Access Code: [email or web address] URL: [email or web address] Date: 02/26/2024 Prepared by: Alm Don   ASSESSMENT:  CLINICAL IMPRESSION:         OBJECTIVE IMPAIRMENTS: Abnormal gait, decreased activity tolerance, difficulty walking, decreased ROM, decreased strength, and pain.   ACTIVITY LIMITATIONS: carrying, lifting, sitting, standing, squatting, sleeping, stairs, transfers, bed mobility, bathing, and dressing  PARTICIPATION LIMITATIONS: meal prep, cleaning, laundry, driving, shopping, community activity, and occupation  PERSONAL FACTORS: None   REHAB POTENTIAL: Excellent  CLINICAL DECISION MAKING: Stable/uncomplicated  EVALUATION COMPLEXITY: Low   GOALS: Goals reviewed with patient? Yes  SHORT TERM GOALS: Target date: 03/25/2024   Patient will increase passive right hip flexion 90  degrees Baseline: Goal status: achieved 7/23  2.  Patient will wean off assistive device as tolerated Baseline:  Goal status: achieved 7/23  3.  Patient will be independent with basic HEP Baseline:  Goal status: INITIAL   LONG TERM GOALS: Target date: 05/20/2024    Patient will go up and down 8 steps with reciprocal gait pattern Baseline:  Goal status: INITIAL  2.  Patient will ambulate community distances without increased pain Baseline:  Goal status: INITIAL  3.  Patient will stand for greater than 1 hour without increased pain in order to perform ADLs Baseline:  Goal status: INITIAL  4.  Patient will return to full exercise routine Baseline:  Goal status: INITIAL    PLAN:  PT FREQUENCY: 2x/week  PT DURATION: 12 weeks  PLANNED INTERVENTIONS: 97110-Therapeutic exercises, 97530- Therapeutic activity, [id number]- Neuromuscular re-education, 97535- Self Care, [id number]- Manual therapy, [id number]- Gait training, [phone]- Aquatic Therapy, 97014- Electrical stimulation (unattended), [phone]- Ultrasound, Patient/Family education, Stair training, Taping, Dry Needling, DME instructions, Cryotherapy, and Moist heat   PLAN FOR NEXT SESSION:  Begin per hip labral protocol   Alm JINNY Don, PT 05/18/2024, 3:21 PM

## 2024-05-19 ENCOUNTER — Encounter (HOSPITAL_BASED_OUTPATIENT_CLINIC_OR_DEPARTMENT_OTHER): Payer: Self-pay | Admitting: Physical Therapy

## 2024-05-25 ENCOUNTER — Encounter (HOSPITAL_BASED_OUTPATIENT_CLINIC_OR_DEPARTMENT_OTHER): Payer: Self-pay | Admitting: Physical Therapy

## 2024-05-25 ENCOUNTER — Ambulatory Visit (HOSPITAL_BASED_OUTPATIENT_CLINIC_OR_DEPARTMENT_OTHER): Admitting: Physical Therapy

## 2024-05-25 DIAGNOSIS — R2689 Other abnormalities of gait and mobility: Secondary | ICD-10-CM

## 2024-05-25 DIAGNOSIS — R29898 Other symptoms and signs involving the musculoskeletal system: Secondary | ICD-10-CM | POA: Diagnosis not present

## 2024-05-25 DIAGNOSIS — M25552 Pain in left hip: Secondary | ICD-10-CM | POA: Diagnosis not present

## 2024-05-25 NOTE — Therapy (Cosign Needed Addendum)
 OUTPATIENT PHYSICAL THERAPY LOWER EXTREMITY TREATMENT/PROGRESS NOTE   Patient Name: Jasmine Conrad MRN: [id number] DOB:[birth year], 38 y.o., female Today's Date: 05/25/2024  END OF SESSION:  PT End of Session - 05/25/24 0852     Visit Number 20    Number of Visits 24    Date for Recertification  05/20/24    Authorization Type [employer]et[employer]    PT Start Time 0845    PT Stop Time 0921    PT Time Calculation (min) 36 min    Activity Tolerance Patient tolerated treatment well;No increased pain    Behavior During Therapy WFL for tasks assessed/performed          Past Medical History:  Diagnosis Date   Depression    Fibroid    Medical history non-contributory    Past Surgical History:  Procedure Laterality Date   CESAREAN SECTION N/A 09/10/2017   Procedure: CESAREAN SECTION;  Surgeon: Dannielle Bouchard, DO;  Location: [facility];  Service: Obstetrics;  Laterality: N/A;  Primary edc 09/16/17 NKDA [name] RNFA   CESAREAN SECTION N/A 08/29/2019   Procedure: CESAREAN SECTION;  Surgeon: Dannielle Bouchard, DO;  Location: [facility];  Service: Obstetrics;  Laterality: N/A;  REPEAT EDC 09/08/19 NKDA [name],  RNFA   WISDOM TOOTH EXTRACTION     Patient Active Problem List   Diagnosis Date Noted   Tear of left acetabular labrum 02/23/2024   Previous cesarean section 08/29/2019   S/P cesarean section 09/10/2017   Trauma during pregnancy 07/03/2017   Left shoulder pain 04/01/2016    PCP: Arnett Repress MD   REFERRING PROVIDER:  Elspeth Parker MD  REFERRING DIAG:  Diagnosis  [phone] (ICD-10-CM) - Tear of left acetabular labrum, initial encounter    THERAPY DIAG:  Left hip pain  Weakness of left hip  Other abnormalities of gait and mobility  Rationale for Evaluation and Treatment: Rehabilitation  ONSET DATE: 02/23/2024 Days since surgery: 92    SUBJECTIVE:   SUBJECTIVE STATEMENT: Patient reports she is doing well today. Some tightness after a tough workout this morning  before therapy.   Eval: She has a long history of left hip pain.  He trialed conservative therapy with no significant improvement in pain.  On 02/23/2024 she had a left hip labral repair.  Her pain is well-controlled at this time.  She is not using any assistive device.  PERTINENT HISTORY: Nothing pertinent PAIN:  Are you having pain? No : NPRS scale: 0 out of 10 consistently Pain location: Anterior hip Pain description: aching  Aggravating factors: prolonged positioning  Relieving factors: medication and ice   PRECAUTIONS: None  RED FLAGS: None   WEIGHT BEARING RESTRICTIONS: No  FALLS:  Has patient fallen in last 6 months? No  LIVING ENVIRONMENT: Has steps at her house   OCCUPATION:  Works for [employer].  Off this week but remote next week.   PLOF: Independent  PATIENT GOALS:    NEXT MD VISIT:  Next week   OBJECTIVE:  Note: Objective measures were completed at Evaluation unless otherwise noted.  DIAGNOSTIC FINDINGS:  Nothing post op   PATIENT SURVEYS:  LEFS  Extreme difficulty/unable (0), Quite a bit of difficulty (1), Moderate difficulty (2), Little difficulty (3), No difficulty (4) Survey date:   9/10  Any of your usual work, housework or school activities  4  2. Usual hobbies, recreational or sporting activities  3  3. Getting into/out of the bath  4  4. Walking between rooms  4  5. Putting  on socks/shoes  4  6. Squatting   4  7. Lifting an object, like a bag of groceries from the floor  4  8. Performing light activities around your home  4  9. Performing heavy activities around your home  3  10. Getting into/out of a car  4  11. Walking 2 blocks  4  12. Walking 1 mile  4  13. Going up/down 10 stairs (1 flight)  4  14. Standing for 1 hour  4  15.  sitting for 1 hour  4  16. Running on even ground  3  17. Running on uneven ground  2  18. Making sharp turns while running fast  1  19. Hopping   3  20. Rolling over in bed  4  Score total:  14/80       COGNITION: Overall cognitive status: Within functional limits for tasks assessed     SENSATION: WFL   POSTURE: No Significant postural limitations  PALPATION: No unexpected TTP  LOWER EXTREMITY ROM:  Passive ROM Right eval Left eval L 9/10  Hip flexion  90   Hip extension     Hip abduction     Hip adduction     Hip internal rotation     Hip external rotation  18   Knee flexion     Knee extension     Ankle dorsiflexion     Ankle plantarflexion     Ankle inversion     Ankle eversion      (Blank rows = not tested)  LOWER EXTREMITY MMT:  MMT Right 9/10 Left 9/10 Right  9/24 Left 9/24  Hip flexion 50.1 49.1 50.1 49.1  Hip extension 40.6 33.6 40.6 33.6  Hip abduction 46.6 44.1 46.6 44.1  Hip adduction 37.9 36.7 37.9 36.7  Hip internal rotation 20.7 15.0 20.7 10.0  Hip external rotation 13.5 19.4 13.5 19.4  Knee flexion      Knee extension 40.3 61.8 40.3 61.8  Ankle dorsiflexion      Ankle plantarflexion      Ankle inversion      Ankle eversion       (Blank rows = not tested)   GAIT: WFL  Step down test: 20x L and R ( not passed on L)                                                                                                                                TREATMENT DATE:  9/24 Recumbent bike lvl 3 Manual: roller to left quad/iliopsoas, LAD to LLE with grade I-II oscillations, grade I-II hip AP mobilization  DL to SL airex pad plyo progression 2x10 Lateral step plyo progression on airex pad 2x10 Anterior-Posterior weight shifts on airex foam pad 2x10  Single leg stance on airex pad 2x30 seconds      9/15 Recumbent bike warm up level 3 Pendulum lunge 3x10 Hip thrust 3x8 40lbs  Weighted  bench step up 10lbs each hand 2x16 Captain morgan 5s 2x5 DL to SL light tier plyo progression: DL hop to scissor hop 2x3 ea Deep lateral step down 3x8  9/10 Recumbent bike warm up level 3 Strength testing and edu of strength test results DL jumping 69d ML  and AP  HEP updates, gym safety Mulligan lateral grade III and inf grade III Posterior L hip mob grade IV STM L glute  9/8 Upright bike lvl 5 5 min 25lb goblet 6x, 30lb 2x6 goblet SL RDL 3x8 SL bridge off box 7k89 Hip flexor march in standing YTB 3x10 Side plank 2x5 each RTB at knees Standing adductor slider 2x10   8/29 There-ex:  Ankle PF green 2x20  Ankle Eversion green 3x15  Single leg heel raise 3x10  Leg press 3x12 70 lbs  Knee extension 3x12 10 lbs  Neuro-re-ed:  Hop and hold on air-ex fwd and lateral 2x15  Reach drill 2x10 with education on progression   8/25 Mulligan lateral grade III and inf grade III Posterior L hip mob grade IV Quadruped hip IR/ER self mob 10x 10lb goblet 3x8 (hip ER torque and sitting between hips)   TRX lateral lunge 2x10 TRX reverse lunge 2x10 Standing banded hip ABD 2x10    PATIENT EDUCATION:  Education details: HEP, symptom management  Person educated: Patient Education method: Explanation, Demonstration, Tactile cues, Verbal cues, and Handouts Education comprehension: verbalized understanding, returned demonstration, verbal cues required, tactile cues required, and needs further education  HOME EXERCISE PROGRAM: Access Code: [email or web address] URL: [email or web address] Date: 02/26/2024 Prepared by: Alm Don   ASSESSMENT:  CLINICAL IMPRESSION:   Patient has been progressing in LE strength, endurance, and activity tolerance. Tolerated therapy well today. Interventions focused on manual therapy to help decrease tightness due to patient doing a heavy workout before therapy. Patient is continuing to progress. Strength measurements were taken last visit. She is progressing well. She has returned to the gym.  She has mild pain at times. She will reduce her visits to 1x a week as she transitions to an independent program.     OBJECTIVE IMPAIRMENTS: Abnormal gait, decreased activity tolerance, difficulty walking, decreased  ROM, decreased strength, and pain.   ACTIVITY LIMITATIONS: carrying, lifting, sitting, standing, squatting, sleeping, stairs, transfers, bed mobility, bathing, and dressing  PARTICIPATION LIMITATIONS: meal prep, cleaning, laundry, driving, shopping, community activity, and occupation  PERSONAL FACTORS: None   REHAB POTENTIAL: Excellent  CLINICAL DECISION MAKING: Stable/uncomplicated  EVALUATION COMPLEXITY: Low   GOALS: Goals reviewed with patient? Yes  SHORT TERM GOALS: Target date: 03/25/2024   Patient will increase passive right hip flexion 90 degrees Baseline: Goal status: achieved 7/23  2.  Patient will wean off assistive device as tolerated Baseline:  Goal status: achieved 7/23  3.  Patient will be independent with basic HEP Baseline:  Goal status: achieved 9/30   LONG TERM GOALS: Target date: 05/20/2024    Patient will go up and down 8 steps with reciprocal gait pattern Baseline:  Goal status: achieved 9/24  2.  Patient will ambulate community distances without increased pain Baseline:  Goal status: achieved 9/24  3.  Patient will stand for greater than 1 hour without increased pain in order to perform ADLs Baseline:  Goal status: achieved 9/24  4.  Patient will return to full exercise routine Baseline:  Goal status: still having mild pain     PLAN:  PT FREQUENCY: 2x/week  PT DURATION: 12 weeks  PLANNED INTERVENTIONS: 97110-Therapeutic exercises, 97530- Therapeutic activity, 97112-  Neuromuscular re-education, [id number]- Self Care, 02859- Manual therapy, [id number]- Gait training, [id number]- Aquatic Therapy, 97014- Electrical stimulation (unattended), [phone]- Ultrasound, Patient/Family education, Stair training, Taping, Dry Needling, DME instructions, Cryotherapy, and Moist heat   PLAN FOR NEXT SESSION:  Begin per hip labral protocol  Alm don PT DTaP  I have reviewed and concur with this student's documentation.   Alm JINNY don, PT 05/25/2024 11:37  AM   During this treatment session, the therapist was present, participating in and directing the treatment.   Lili Finder, Student-PT 05/25/2024, 9:23 AM

## 2024-05-31 ENCOUNTER — Ambulatory Visit (HOSPITAL_BASED_OUTPATIENT_CLINIC_OR_DEPARTMENT_OTHER): Admitting: Physical Therapy

## 2024-05-31 ENCOUNTER — Encounter (HOSPITAL_BASED_OUTPATIENT_CLINIC_OR_DEPARTMENT_OTHER): Payer: Self-pay | Admitting: Physical Therapy

## 2024-05-31 DIAGNOSIS — R2689 Other abnormalities of gait and mobility: Secondary | ICD-10-CM

## 2024-05-31 DIAGNOSIS — R29898 Other symptoms and signs involving the musculoskeletal system: Secondary | ICD-10-CM | POA: Diagnosis not present

## 2024-05-31 DIAGNOSIS — M25552 Pain in left hip: Secondary | ICD-10-CM

## 2024-05-31 NOTE — Therapy (Signed)
 OUTPATIENT PHYSICAL THERAPY LOWER EXTREMITY TREATMENT/PROGRESS NOTE   Patient Name: Jasmine Conrad MRN: [id number] DOB:1986/04/06, 38 y.o., female Today's Date: 05/31/2024  END OF SESSION:  PT End of Session - 05/31/24 1340     Visit Number 21    Number of Visits 24    Date for Recertification  07/26/24    Authorization Type [employer]    PT Start Time 0930    PT Stop Time 1012    PT Time Calculation (min) 42 min    Activity Tolerance Patient tolerated treatment well;No increased pain    Behavior During Therapy WFL for tasks assessed/performed          Past Medical History:  Diagnosis Date   Depression    Fibroid    Medical history non-contributory    Past Surgical History:  Procedure Laterality Date   CESAREAN SECTION N/A 09/10/2017   Procedure: CESAREAN SECTION;  Surgeon: Dannielle Bouchard, DO;  Location: [facility];  Service: Obstetrics;  Laterality: N/A;  Primary edc 09/16/17 NKDA [name] RNFA   CESAREAN SECTION N/A 08/29/2019   Procedure: CESAREAN SECTION;  Surgeon: Dannielle Bouchard, DO;  Location: [facility];  Service: Obstetrics;  Laterality: N/A;  REPEAT EDC 09/08/19 NKDA [name],  RNFA   WISDOM TOOTH EXTRACTION     Patient Active Problem List   Diagnosis Date Noted   Tear of left acetabular labrum 02/23/2024   Previous cesarean section 08/29/2019   S/P cesarean section 09/10/2017   Trauma during pregnancy 07/03/2017   Left shoulder pain 04/01/2016    PCP: Arnett Repress MD   REFERRING PROVIDER:  Elspeth Parker MD  REFERRING DIAG:  Diagnosis  [phone] (ICD-10-CM) - Tear of left acetabular labrum, initial encounter    THERAPY DIAG:  Left hip pain  Weakness of left hip  Other abnormalities of gait and mobility  Rationale for Evaluation and Treatment: Rehabilitation  ONSET DATE: 02/23/2024 Days since surgery: 98    SUBJECTIVE:   SUBJECTIVE STATEMENT: The patient was sore after a total body workout. Overall she is doing well.   Eval: She  has a long history of left hip pain.  He trialed conservative therapy with no significant improvement in pain.  On 02/23/2024 she had a left hip labral repair.  Her pain is well-controlled at this time.  She is not using any assistive device.  PERTINENT HISTORY: Nothing pertinent PAIN:  Are you having pain? No : NPRS scale: 0 out of 10 consistently Pain location: Anterior hip Pain description: aching  Aggravating factors: prolonged positioning  Relieving factors: medication and ice   PRECAUTIONS: None  RED FLAGS: None   WEIGHT BEARING RESTRICTIONS: No  FALLS:  Has patient fallen in last 6 months? No  LIVING ENVIRONMENT: Has steps at her house   OCCUPATION:  Works for [employer].  Off this week but remote next week.   PLOF: Independent  PATIENT GOALS:    NEXT MD VISIT:  Next week   OBJECTIVE:  Note: Objective measures were completed at Evaluation unless otherwise noted.  DIAGNOSTIC FINDINGS:  Nothing post op   PATIENT SURVEYS:  LEFS  Extreme difficulty/unable (0), Quite a bit of difficulty (1), Moderate difficulty (2), Little difficulty (3), No difficulty (4) Survey date:   9/10  Any of your usual work, housework or school activities  4  2. Usual hobbies, recreational or sporting activities  3  3. Getting into/out of the bath  4  4. Walking between rooms  4  5. Putting on socks/shoes  4  6. Squatting   4  7. Lifting an object, like a bag of groceries from the floor  4  8. Performing light activities around your home  4  9. Performing heavy activities around your home  3  10. Getting into/out of a car  4  11. Walking 2 blocks  4  12. Walking 1 mile  4  13. Going up/down 10 stairs (1 flight)  4  14. Standing for 1 hour  4  15.  sitting for 1 hour  4  16. Running on even ground  3  17. Running on uneven ground  2  18. Making sharp turns while running fast  1  19. Hopping   3  20. Rolling over in bed  4  Score total:  14/80      COGNITION: Overall cognitive  status: Within functional limits for tasks assessed     SENSATION: WFL   POSTURE: No Significant postural limitations  PALPATION: No unexpected TTP  LOWER EXTREMITY ROM:  Passive ROM Right eval Left eval L 9/10  Hip flexion  90   Hip extension     Hip abduction     Hip adduction     Hip internal rotation     Hip external rotation  18   Knee flexion     Knee extension     Ankle dorsiflexion     Ankle plantarflexion     Ankle inversion     Ankle eversion      (Blank rows = not tested)  LOWER EXTREMITY MMT:  MMT Right 9/10 Left 9/10 Right  9/24 Left 9/24  Hip flexion 50.1 49.1 50.1 49.1  Hip extension 40.6 33.6 40.6 33.6  Hip abduction 46.6 44.1 46.6 44.1  Hip adduction 37.9 36.7 37.9 36.7  Hip internal rotation 20.7 15.0 20.7 10.0  Hip external rotation 13.5 19.4 13.5 19.4  Knee flexion      Knee extension 40.3 61.8 40.3 61.8  Ankle dorsiflexion      Ankle plantarflexion      Ankle inversion      Ankle eversion       (Blank rows = not tested)   GAIT: WFL  Step down test: 20x L and R ( not passed on L)                                                                                                                                TREATMENT DATE:  09/30 Manual: roller to left quad/iliopsoas, LAD to LLE with grade I-II oscillations, grade I-II hip AP mobilization   Neuro-re-ed: Single leg stance on air-ex 2x30 sec hold  Hop and hold fwd and lateral x15   Cable walk x10 165 lbs fwd and back  Single leg cone drill 3x10    9/24 Recumbent bike lvl 3 Manual: roller to left quad/iliopsoas, LAD to LLE with grade I-II oscillations, grade I-II hip AP mobilization  DL  to SL airex pad plyo progression 2x10 Lateral step plyo progression on airex pad 2x10 Anterior-Posterior weight shifts on airex foam pad 2x10  Single leg stance on airex pad 2x30 seconds      9/15 Recumbent bike warm up level 3 Pendulum lunge 3x10 Hip thrust 3x8 40lbs  Weighted bench  step up 10lbs each hand 2x16 Captain morgan 5s 2x5 DL to SL light tier plyo progression: DL hop to scissor hop 2x3 ea Deep lateral step down 3x8  9/10 Recumbent bike warm up level 3 Strength testing and edu of strength test results DL jumping 69d ML and AP  HEP updates, gym safety Mulligan lateral grade III and inf grade III Posterior L hip mob grade IV STM L glute  9/8 Upright bike lvl 5 5 min 25lb goblet 6x, 30lb 2x6 goblet SL RDL 3x8 SL bridge off box [id number] Hip flexor march in standing YTB 3x10 Side plank 2x5 each RTB at knees Standing adductor slider 2x10        PATIENT EDUCATION:  Education details: HEP, symptom management  Person educated: Patient Education method: Explanation, Demonstration, Tactile cues, Verbal cues, and Handouts Education comprehension: verbalized understanding, returned demonstration, verbal cues required, tactile cues required, and needs further education  HOME EXERCISE PROGRAM: Access Code: [email or web address] URL: [email or web address] Date: 02/26/2024 Prepared by: Alm Don   ASSESSMENT:  CLINICAL IMPRESSION:   Therapy continues to work on single leg stability today. She tolerated well. We had her work on backwards cable walk. She felt some pain in her hip. The pain went away after a few minutes. We will continue to progress towards independent HEP.    OBJECTIVE IMPAIRMENTS: Abnormal gait, decreased activity tolerance, difficulty walking, decreased ROM, decreased strength, and pain.   ACTIVITY LIMITATIONS: carrying, lifting, sitting, standing, squatting, sleeping, stairs, transfers, bed mobility, bathing, and dressing  PARTICIPATION LIMITATIONS: meal prep, cleaning, laundry, driving, shopping, community activity, and occupation  PERSONAL FACTORS: None   REHAB POTENTIAL: Excellent  CLINICAL DECISION MAKING: Stable/uncomplicated  EVALUATION COMPLEXITY: Low   GOALS: Goals reviewed with patient? Yes  SHORT TERM GOALS:  Target date: 03/25/2024   Patient will increase passive right hip flexion 90 degrees Baseline: Goal status: achieved 7/23  2.  Patient will wean off assistive device as tolerated Baseline:  Goal status: achieved 7/23  3.  Patient will be independent with basic HEP Baseline:  Goal status: achieved 9/30   LONG TERM GOALS: Target date: 05/20/2024    Patient will go up and down 8 steps with reciprocal gait pattern Baseline:  Goal status: achieved 9/24  2.  Patient will ambulate community distances without increased pain Baseline:  Goal status: achieved 9/24  3.  Patient will stand for greater than 1 hour without increased pain in order to perform ADLs Baseline:  Goal status: achieved 9/24  4.  Patient will return to full exercise routine Baseline:  Goal status: still having mild pain     PLAN:  PT FREQUENCY: 2x/week  PT DURATION: 12 weeks  PLANNED INTERVENTIONS: 97110-Therapeutic exercises, 97530- Therapeutic activity, [id number]- Neuromuscular re-education, 97535- Self Care, [id number]- Manual therapy, [id number]- Gait training, [phone]- Aquatic Therapy, 97014- Electrical stimulation (unattended), 97035- Ultrasound, Patient/Family education, Stair training, Taping, Dry Needling, DME instructions, Cryotherapy, and Moist heat   PLAN FOR NEXT SESSION:  Begin per hip labral protocol  Alm don PT DTaP  I have reviewed and concur with this student's documentation.   Alm JINNY Don, PT 05/31/2024 2:21 PM   During this treatment session, the therapist was  present, participating in and directing the treatment.   Alm JINNY Don, PT 05/31/2024, 2:21 PM

## 2024-05-31 NOTE — Addendum Note (Signed)
 Addended by: DOW ALM PARAS on: 05/31/2024 02:20 PM   Modules accepted: Orders

## 2024-06-02 ENCOUNTER — Encounter (HOSPITAL_BASED_OUTPATIENT_CLINIC_OR_DEPARTMENT_OTHER): Payer: Self-pay | Admitting: Physical Therapy

## 2024-06-02 ENCOUNTER — Ambulatory Visit (HOSPITAL_BASED_OUTPATIENT_CLINIC_OR_DEPARTMENT_OTHER): Attending: Family Medicine | Admitting: Physical Therapy

## 2024-06-02 DIAGNOSIS — R29898 Other symptoms and signs involving the musculoskeletal system: Secondary | ICD-10-CM | POA: Insufficient documentation

## 2024-06-02 DIAGNOSIS — R2689 Other abnormalities of gait and mobility: Secondary | ICD-10-CM | POA: Diagnosis not present

## 2024-06-02 DIAGNOSIS — M25552 Pain in left hip: Secondary | ICD-10-CM | POA: Insufficient documentation

## 2024-06-02 NOTE — Therapy (Signed)
 OUTPATIENT PHYSICAL THERAPY LOWER EXTREMITY TREATMENT/PROGRESS NOTE   Patient Name: Jasmine Conrad MRN: [id number] DOB:[birth year], 38 y.o., female Today's Date: 06/02/2024  END OF SESSION:  PT End of Session - 06/02/24 0938     Visit Number 22    Number of Visits 24    Date for Recertification  07/26/24    Authorization Type [employer]    PT Start Time [phone]    PT Stop Time 1013    PT Time Calculation (min) 40 min    Activity Tolerance Patient tolerated treatment well;No increased pain    Behavior During Therapy WFL for tasks assessed/performed          Past Medical History:  Diagnosis Date   Depression    Fibroid    Medical history non-contributory    Past Surgical History:  Procedure Laterality Date   CESAREAN SECTION N/A 09/10/2017   Procedure: CESAREAN SECTION;  Surgeon: Dannielle Bouchard, DO;  Location: [facility];  Service: Obstetrics;  Laterality: N/A;  Primary edc 09/16/17 NKDA [name] RNFA   CESAREAN SECTION N/A 08/29/2019   Procedure: CESAREAN SECTION;  Surgeon: Dannielle Bouchard, DO;  Location: [facility];  Service: Obstetrics;  Laterality: N/A;  REPEAT EDC 09/08/19 NKDA [name],  RNFA   WISDOM TOOTH EXTRACTION     Patient Active Problem List   Diagnosis Date Noted   Tear of left acetabular labrum 02/23/2024   Previous cesarean section 08/29/2019   S/P cesarean section 09/10/2017   Trauma during pregnancy 07/03/2017   Left shoulder pain 04/01/2016    PCP: Arnett Repress MD   REFERRING PROVIDER:  Elspeth Parker MD  REFERRING DIAG:  Diagnosis  [phone] (ICD-10-CM) - Tear of left acetabular labrum, initial encounter    THERAPY DIAG:  Left hip pain  Weakness of left hip  Other abnormalities of gait and mobility  Rationale for Evaluation and Treatment: Rehabilitation  ONSET DATE: 02/23/2024 Days since surgery: 100    SUBJECTIVE:   SUBJECTIVE STATEMENT: The patient had mild soreness after the last visit. She reports overall she is doing well.   Eval: She has a long history of left hip pain.  He trialed conservative therapy with no significant improvement in pain.  On 02/23/2024 she had a left hip labral repair.  Her pain is well-controlled at this time.  She is not using any assistive device.  PERTINENT HISTORY: Nothing pertinent PAIN:  Are you having pain? No : NPRS scale: 0 out of 10 consistently Pain location: Anterior hip Pain description: aching  Aggravating factors: prolonged positioning  Relieving factors: medication and ice   PRECAUTIONS: None  RED FLAGS: None   WEIGHT BEARING RESTRICTIONS: No  FALLS:  Has patient fallen in last 6 months? No  LIVING ENVIRONMENT: Has steps at her house   OCCUPATION:  Works for [employer].  Off this week but remote next week.   PLOF: Independent  PATIENT GOALS:    NEXT MD VISIT:  Next week   OBJECTIVE:  Note: Objective measures were completed at Evaluation unless otherwise noted.  DIAGNOSTIC FINDINGS:  Nothing post op   PATIENT SURVEYS:  LEFS  Extreme difficulty/unable (0), Quite a bit of difficulty (1), Moderate difficulty (2), Little difficulty (3), No difficulty (4) Survey date:   9/10  Any of your usual work, housework or school activities  4  2. Usual hobbies, recreational or sporting activities  3  3. Getting into/out of the bath  4  4. Walking between rooms  4  5. Putting on socks/shoes  4  6. Squatting   4  7. Lifting an object, like a bag of groceries from the floor  4  8. Performing light activities around your home  4  9. Performing heavy activities around your home  3  10. Getting into/out of a car  4  11. Walking 2 blocks  4  12. Walking 1 mile  4  13. Going up/down 10 stairs (1 flight)  4  14. Standing for 1 hour  4  15.  sitting for 1 hour  4  16. Running on even ground  3  17. Running on uneven ground  2  18. Making sharp turns while running fast  1  19. Hopping   3  20. Rolling over in bed  4  Score total:  14/80      COGNITION: Overall  cognitive status: Within functional limits for tasks assessed     SENSATION: WFL   POSTURE: No Significant postural limitations  PALPATION: No unexpected TTP  LOWER EXTREMITY ROM:  Passive ROM Right eval Left eval L 9/10  Hip flexion  90   Hip extension     Hip abduction     Hip adduction     Hip internal rotation     Hip external rotation  18   Knee flexion     Knee extension     Ankle dorsiflexion     Ankle plantarflexion     Ankle inversion     Ankle eversion      (Blank rows = not tested)  LOWER EXTREMITY MMT:  MMT Right 9/10 Left 9/10 Right  9/24 Left 9/24  Hip flexion 50.1 49.1 50.1 49.1  Hip extension 40.6 33.6 40.6 33.6  Hip abduction 46.6 44.1 46.6 44.1  Hip adduction 37.9 36.7 37.9 36.7  Hip internal rotation 20.7 15.0 20.7 10.0  Hip external rotation 13.5 19.4 13.5 19.4  Knee flexion      Knee extension 40.3 61.8 40.3 61.8  Ankle dorsiflexion      Ankle plantarflexion      Ankle inversion      Ankle eversion       (Blank rows = not tested)   GAIT: WFL  Step down test: 20x L and R ( not passed on L)                                                                                                                                TREATMENT DATE:  10/2 Neuro-re-ed: Single leg stance on air-ex 2x30 sec hold  Hop and hold fwd and lateral 2x15  Goblet squats 3x12 15 lbs in mirror for weight shift   There-ex Leg press LF 55 lbs 3x12  Knee extension 25 lbs 3x12   09/30 Manual: roller to left quad/iliopsoas, LAD to LLE with grade I-II oscillations, grade I-II hip AP mobilization   Neuro-re-ed: Single leg stance on air-ex 2x30 sec hold  Hop and hold fwd  and lateral x15   Cable walk x10 165 lbs fwd and back  Single leg cone drill 3x10    9/24 Recumbent bike lvl 3 Manual: roller to left quad/iliopsoas, LAD to LLE with grade I-II oscillations, grade I-II hip AP mobilization  DL to SL airex pad plyo progression 2x10 Lateral step plyo  progression on airex pad 2x10 Anterior-Posterior weight shifts on airex foam pad 2x10  Single leg stance on airex pad 2x30 seconds      9/15 Recumbent bike warm up level 3 Pendulum lunge 3x10 Hip thrust 3x8 40lbs  Weighted bench step up 10lbs each hand 2x16 Captain morgan 5s 2x5 DL to SL light tier plyo progression: DL hop to scissor hop 2x3 ea Deep lateral step down 3x8  9/10 Recumbent bike warm up level 3 Strength testing and edu of strength test results DL jumping 69d ML and AP  HEP updates, gym safety Mulligan lateral grade III and inf grade III Posterior L hip mob grade IV STM L glute  9/8 Upright bike lvl 5 5 min 25lb goblet 6x, 30lb 2x6 goblet SL RDL 3x8 SL bridge off box [id number] Hip flexor march in standing YTB 3x10 Side plank 2x5 each RTB at knees Standing adductor slider 2x10        PATIENT EDUCATION:  Education details: HEP, symptom management  Person educated: Patient Education method: Explanation, Demonstration, Tactile cues, Verbal cues, and Handouts Education comprehension: verbalized understanding, returned demonstration, verbal cues required, tactile cues required, and needs further education  HOME EXERCISE PROGRAM: Access Code: [email or web address] URL: [email or web address] Date: 02/26/2024 Prepared by: Alm Don   ASSESSMENT:  CLINICAL IMPRESSION:   Therapy continues to work on single leg stability today. She tolerated well. We had her work on backwards cable walk. She felt some pain in her hip. The pain went away after a few minutes. We will continue to progress towards independent HEP.    OBJECTIVE IMPAIRMENTS: Abnormal gait, decreased activity tolerance, difficulty walking, decreased ROM, decreased strength, and pain.   ACTIVITY LIMITATIONS: carrying, lifting, sitting, standing, squatting, sleeping, stairs, transfers, bed mobility, bathing, and dressing  PARTICIPATION LIMITATIONS: meal prep, cleaning, laundry, driving, shopping,  community activity, and occupation  PERSONAL FACTORS: None   REHAB POTENTIAL: Excellent  CLINICAL DECISION MAKING: Stable/uncomplicated  EVALUATION COMPLEXITY: Low   GOALS: Goals reviewed with patient? Yes  SHORT TERM GOALS: Target date: 03/25/2024   Patient will increase passive right hip flexion 90 degrees Baseline: Goal status: achieved 7/23  2.  Patient will wean off assistive device as tolerated Baseline:  Goal status: achieved 7/23  3.  Patient will be independent with basic HEP Baseline:  Goal status: achieved 9/30   LONG TERM GOALS: Target date: 05/20/2024    Patient will go up and down 8 steps with reciprocal gait pattern Baseline:  Goal status: achieved 9/24  2.  Patient will ambulate community distances without increased pain Baseline:  Goal status: achieved 9/24  3.  Patient will stand for greater than 1 hour without increased pain in order to perform ADLs Baseline:  Goal status: achieved 9/24  4.  Patient will return to full exercise routine Baseline:  Goal status: still having mild pain     PLAN:  PT FREQUENCY: 2x/week  PT DURATION: 12 weeks  PLANNED INTERVENTIONS: 97110-Therapeutic exercises, 97530- Therapeutic activity, [id number]- Neuromuscular re-education, 97535- Self Care, 02859- Manual therapy, [id number]- Gait training, [phone]- Aquatic Therapy, 97014- Electrical stimulation (unattended), 97035- Ultrasound, Patient/Family education, Stair training, Taping, Dry Needling, DME instructions, Cryotherapy, and Moist  heat   PLAN FOR NEXT SESSION:  Begin per hip labral protocol  Alm don PT DTaP  I have reviewed and concur with this student's documentation.   Alm JINNY don, PT 06/02/2024 12:09 PM   During this treatment session, the therapist was present, participating in and directing the treatment.   Alm JINNY don, PT 06/02/2024, 12:09 PM

## 2024-06-13 ENCOUNTER — Encounter (HOSPITAL_BASED_OUTPATIENT_CLINIC_OR_DEPARTMENT_OTHER): Payer: Self-pay | Admitting: Physical Therapy

## 2024-06-13 ENCOUNTER — Ambulatory Visit (HOSPITAL_BASED_OUTPATIENT_CLINIC_OR_DEPARTMENT_OTHER): Admitting: Physical Therapy

## 2024-06-13 DIAGNOSIS — R2689 Other abnormalities of gait and mobility: Secondary | ICD-10-CM

## 2024-06-13 DIAGNOSIS — M25552 Pain in left hip: Secondary | ICD-10-CM | POA: Diagnosis not present

## 2024-06-13 DIAGNOSIS — R29898 Other symptoms and signs involving the musculoskeletal system: Secondary | ICD-10-CM | POA: Diagnosis not present

## 2024-06-13 NOTE — Therapy (Signed)
 OUTPATIENT PHYSICAL THERAPY LOWER EXTREMITY TREATMENT/PROGRESS NOTE   Patient Name: Jasmine Conrad MRN: [id number] DOB:1986/05/07, 38 y.o., female Today's Date: 06/13/2024  END OF SESSION:  PT End of Session - 06/13/24 1226     Visit Number 23    Number of Visits 24    Date for Recertification  07/26/24    Authorization Type [employer]    PT Start Time 1146    PT Stop Time 1226    PT Time Calculation (min) 40 min    Activity Tolerance Patient tolerated treatment well;No increased pain    Behavior During Therapy WFL for tasks assessed/performed           Past Medical History:  Diagnosis Date   Depression    Fibroid    Medical history non-contributory    Past Surgical History:  Procedure Laterality Date   CESAREAN SECTION N/A 09/10/2017   Procedure: CESAREAN SECTION;  Surgeon: Dannielle Bouchard, DO;  Location: [facility];  Service: Obstetrics;  Laterality: N/A;  Primary edc 09/16/17 NKDA [name] RNFA   CESAREAN SECTION N/A 08/29/2019   Procedure: CESAREAN SECTION;  Surgeon: Dannielle Bouchard, DO;  Location: [facility];  Service: Obstetrics;  Laterality: N/A;  REPEAT EDC 09/08/19 NKDA [name],  RNFA   WISDOM TOOTH EXTRACTION     Patient Active Problem List   Diagnosis Date Noted   Tear of left acetabular labrum 02/23/2024   Previous cesarean section 08/29/2019   S/P cesarean section 09/10/2017   Trauma during pregnancy 07/03/2017   Left shoulder pain 04/01/2016    PCP: Arnett Repress MD   REFERRING PROVIDER:  Elspeth Parker MD  REFERRING DIAG:  Diagnosis  [phone] (ICD-10-CM) - Tear of left acetabular labrum, initial encounter    THERAPY DIAG:  Left hip pain  Weakness of left hip  Other abnormalities of gait and mobility  Rationale for Evaluation and Treatment: Rehabilitation  ONSET DATE: 02/23/2024 Days since surgery: 111    SUBJECTIVE:   SUBJECTIVE STATEMENT:  Pt reports that going to [place] golf irritated the hip. Deep flexion and twisting are  still uncomfortable.  Eval: She has a long history of left hip pain.  He trialed conservative therapy with no significant improvement in pain.  On 02/23/2024 she had a left hip labral repair.  Her pain is well-controlled at this time.  She is not using any assistive device.  PERTINENT HISTORY: Nothing pertinent PAIN:  Are you having pain? No : NPRS scale: 0 out of 10 consistently Pain location: Anterior hip Pain description: aching  Aggravating factors: prolonged positioning  Relieving factors: medication and ice   PRECAUTIONS: None  RED FLAGS: None   WEIGHT BEARING RESTRICTIONS: No  FALLS:  Has patient fallen in last 6 months? No  LIVING ENVIRONMENT: Has steps at her house   OCCUPATION:  Works for [employer].  Off this week but remote next week.   PLOF: Independent  PATIENT GOALS:    NEXT MD VISIT:  Next week   OBJECTIVE:  Note: Objective measures were completed at Evaluation unless otherwise noted.  DIAGNOSTIC FINDINGS:  Nothing post op   PATIENT SURVEYS:  LEFS  Extreme difficulty/unable (0), Quite a bit of difficulty (1), Moderate difficulty (2), Little difficulty (3), No difficulty (4) Survey date:   9/10  Any of your usual work, housework or school activities  4  2. Usual hobbies, recreational or sporting activities  3  3. Getting into/out of the bath  4  4. Walking between rooms  4  5.  Putting on socks/shoes  4  6. Squatting   4  7. Lifting an object, like a bag of groceries from the floor  4  8. Performing light activities around your home  4  9. Performing heavy activities around your home  3  10. Getting into/out of a car  4  11. Walking 2 blocks  4  12. Walking 1 mile  4  13. Going up/down 10 stairs (1 flight)  4  14. Standing for 1 hour  4  15.  sitting for 1 hour  4  16. Running on even ground  3  17. Running on uneven ground  2  18. Making sharp turns while running fast  1  19. Hopping   3  20. Rolling over in bed  4  Score total:  14/80       COGNITION: Overall cognitive status: Within functional limits for tasks assessed     SENSATION: WFL   POSTURE: No Significant postural limitations  PALPATION: No unexpected TTP  LOWER EXTREMITY ROM:  Passive ROM Right eval Left eval L 9/10  Hip flexion  90   Hip extension     Hip abduction     Hip adduction     Hip internal rotation     Hip external rotation  18   Knee flexion     Knee extension     Ankle dorsiflexion     Ankle plantarflexion     Ankle inversion     Ankle eversion      (Blank rows = not tested)  LOWER EXTREMITY MMT:  MMT Right 9/10 Left 9/10 Right  9/24 Left 9/24  Hip flexion 50.1 49.1 50.1 49.1  Hip extension 40.6 33.6 40.6 33.6  Hip abduction 46.6 44.1 46.6 44.1  Hip adduction 37.9 36.7 37.9 36.7  Hip internal rotation 20.7 15.0 20.7 10.0  Hip external rotation 13.5 19.4 13.5 19.4  Knee flexion      Knee extension 40.3 61.8 40.3 61.8  Ankle dorsiflexion      Ankle plantarflexion      Ankle inversion      Ankle eversion       (Blank rows = not tested)   GAIT: WFL  Step down test: 20x L and R ( not passed on L)                                                                                                                                TREATMENT DATE:   10/13  Mulligan lateral, inf mob grade III; lateral mob with IR/ER grade III  Semi-reclined hip 90/90 20x Seated hip figure 4 with rotation 3s 10x Sideplank with rotation 4x6 Curtsy lunge with UE assist 3x8 DL box jump 3x5 (pliant landing vs stiff) Split squat jump 3x8 (deep tier plyo)   [facility] return to running protocol  10/2 Neuro-re-ed: Single leg stance on air-ex 2x30 sec hold  Hop and hold fwd  and lateral 2x15  Goblet squats 3x12 15 lbs in mirror for weight shift   There-ex Leg press LF 55 lbs 3x12  Knee extension 25 lbs 3x12   09/30 Manual: roller to left quad/iliopsoas, LAD to LLE with grade I-II oscillations, grade I-II hip AP mobilization    Neuro-re-ed: Single leg stance on air-ex 2x30 sec hold  Hop and hold fwd and lateral x15   Cable walk x10 165 lbs fwd and back  Single leg cone drill 3x10    9/24 Recumbent bike lvl 3 Manual: roller to left quad/iliopsoas, LAD to LLE with grade I-II oscillations, grade I-II hip AP mobilization  DL to SL airex pad plyo progression 2x10 Lateral step plyo progression on airex pad 2x10 Anterior-Posterior weight shifts on airex foam pad 2x10  Single leg stance on airex pad 2x30 seconds      9/15 Recumbent bike warm up level 3 Pendulum lunge 3x10 Hip thrust 3x8 40lbs  Weighted bench step up 10lbs each hand 2x16 Captain morgan 5s 2x5 DL to SL light tier plyo progression: DL hop to scissor hop 2x3 ea Deep lateral step down 3x8  9/10 Recumbent bike warm up level 3 Strength testing and edu of strength test results DL jumping 69d ML and AP  HEP updates, gym safety Mulligan lateral grade III and inf grade III Posterior L hip mob grade IV STM L glute  9/8 Upright bike lvl 5 5 min 25lb goblet 6x, 30lb 2x6 goblet SL RDL 3x8 SL bridge off box [id number] Hip flexor march in standing YTB 3x10 Side plank 2x5 each RTB at knees Standing adductor slider 2x10        PATIENT EDUCATION:  Education details: HEP, symptom management  Person educated: Patient Education method: Explanation, Demonstration, Tactile cues, Verbal cues, and Handouts Education comprehension: verbalized understanding, returned demonstration, verbal cues required, tactile cues required, and needs further education  HOME EXERCISE PROGRAM: Access Code: [email or web address] URL: [email or web address] Date: 02/26/2024 Prepared by: Alm Don   ASSESSMENT:  CLINICAL IMPRESSION:     Pt is 16 wks post op a this time. SL rotational strength progressed as well as double and split stance plyos in order to progress return to running. Pt with good form and technique during and iwthout pain during session. HEP  updated accordingly and [facility] return to running program provided as she can begin slow jogging for return to exercise. Plan to continue with progression of SL stability, strength, and return to exercise/running progression.   OBJECTIVE IMPAIRMENTS: Abnormal gait, decreased activity tolerance, difficulty walking, decreased ROM, decreased strength, and pain.   ACTIVITY LIMITATIONS: carrying, lifting, sitting, standing, squatting, sleeping, stairs, transfers, bed mobility, bathing, and dressing  PARTICIPATION LIMITATIONS: meal prep, cleaning, laundry, driving, shopping, community activity, and occupation  PERSONAL FACTORS: None   REHAB POTENTIAL: Excellent  CLINICAL DECISION MAKING: Stable/uncomplicated  EVALUATION COMPLEXITY: Low   GOALS: Goals reviewed with patient? Yes  SHORT TERM GOALS: Target date: 03/25/2024   Patient will increase passive right hip flexion 90 degrees Baseline: Goal status: achieved 7/23  2.  Patient will wean off assistive device as tolerated Baseline:  Goal status: achieved 7/23  3.  Patient will be independent with basic HEP Baseline:  Goal status: achieved 9/30   LONG TERM GOALS: Target date: 05/20/2024    Patient will go up and down 8 steps with reciprocal gait pattern Baseline:  Goal status: achieved 9/24  2.  Patient will ambulate community distances without increased pain Baseline:  Goal status: achieved 9/24  3.  Patient will stand for greater than 1 hour without increased pain in order to perform ADLs Baseline:  Goal status: achieved 9/24  4.  Patient will return to full exercise routine Baseline:  Goal status: still having mild pain     PLAN:  PT FREQUENCY: 2x/week  PT DURATION: 12 weeks  PLANNED INTERVENTIONS: 97110-Therapeutic exercises, 97530- Therapeutic activity, [id number]- Neuromuscular re-education, 97535- Self Care, [id number]- Manual therapy, [id number]- Gait training, [phone]- Aquatic Therapy, 97014- Electrical stimulation  (unattended), 97035- Ultrasound, Patient/Family education, Stair training, Taping, Dry Needling, DME instructions, Cryotherapy, and Moist heat   PLAN FOR NEXT SESSION:  Begin per hip labral protocol  Dale Call PT, DPT 06/13/24 12:46 PM

## 2024-06-14 ENCOUNTER — Other Ambulatory Visit: Payer: Self-pay | Admitting: Medical Genetics

## 2024-06-14 DIAGNOSIS — Z006 Encounter for examination for normal comparison and control in clinical research program: Secondary | ICD-10-CM

## 2024-06-22 ENCOUNTER — Ambulatory Visit (HOSPITAL_BASED_OUTPATIENT_CLINIC_OR_DEPARTMENT_OTHER): Admitting: Physical Therapy

## 2024-06-22 ENCOUNTER — Encounter (HOSPITAL_BASED_OUTPATIENT_CLINIC_OR_DEPARTMENT_OTHER): Payer: Self-pay | Admitting: Physical Therapy

## 2024-06-22 DIAGNOSIS — R29898 Other symptoms and signs involving the musculoskeletal system: Secondary | ICD-10-CM

## 2024-06-22 DIAGNOSIS — M25552 Pain in left hip: Secondary | ICD-10-CM | POA: Diagnosis not present

## 2024-06-22 DIAGNOSIS — R2689 Other abnormalities of gait and mobility: Secondary | ICD-10-CM

## 2024-06-22 NOTE — Therapy (Signed)
 OUTPATIENT PHYSICAL THERAPY LOWER EXTREMITY TREATMENT/PROGRESS NOTE   Patient Name: Jasmine Conrad MRN: [id number] DOB:Feb 20, 1986, 38 y.o., female Today's Date: 06/22/2024  END OF SESSION:  PT End of Session - 06/22/24 1021     Visit Number 24    Number of Visits 40    Date for Recertification  07/26/24    Authorization Type [employer]    PT Start Time 1016    PT Stop Time 1056    PT Time Calculation (min) 40 min    Activity Tolerance Patient tolerated treatment well;No increased pain    Behavior During Therapy WFL for tasks assessed/performed            Past Medical History:  Diagnosis Date   Depression    Fibroid    Medical history non-contributory    Past Surgical History:  Procedure Laterality Date   CESAREAN SECTION N/A 09/10/2017   Procedure: CESAREAN SECTION;  Surgeon: Dannielle Bouchard, DO;  Location: [facility];  Service: Obstetrics;  Laterality: N/A;  Primary edc 09/16/17 NKDA [name] RNFA   CESAREAN SECTION N/A 08/29/2019   Procedure: CESAREAN SECTION;  Surgeon: Dannielle Bouchard, DO;  Location: [facility];  Service: Obstetrics;  Laterality: N/A;  REPEAT EDC 09/08/19 NKDA [name],  RNFA   WISDOM TOOTH EXTRACTION     Patient Active Problem List   Diagnosis Date Noted   Tear of left acetabular labrum 02/23/2024   Previous cesarean section 08/29/2019   S/P cesarean section 09/10/2017   Trauma during pregnancy 07/03/2017   Left shoulder pain 04/01/2016    PCP: Arnett Repress MD   REFERRING PROVIDER:  Elspeth Parker MD  REFERRING DIAG:  Diagnosis  [phone] (ICD-10-CM) - Tear of left acetabular labrum, initial encounter    THERAPY DIAG:  Weakness of left hip  Other abnormalities of gait and mobility  Left hip pain  Rationale for Evaluation and Treatment: Rehabilitation  ONSET DATE: 02/23/2024 Days since surgery: 120    SUBJECTIVE:   SUBJECTIVE STATEMENT:  Pt reports starting jogging program with some medial knee discomfort. Front squats,  thrusters, cleans are still painful in class. Lateral lunge/squats are uncomfortable.    Eval: She has a long history of left hip pain.  He trialed conservative therapy with no significant improvement in pain.  On 02/23/2024 she had a left hip labral repair.  Her pain is well-controlled at this time.  She is not using any assistive device.  PERTINENT HISTORY: Nothing pertinent PAIN:  Are you having pain? No : NPRS scale: 0 out of 10 consistently Pain location: Anterior hip Pain description: aching  Aggravating factors: prolonged positioning  Relieving factors: medication and ice   PRECAUTIONS: None  RED FLAGS: None   WEIGHT BEARING RESTRICTIONS: No  FALLS:  Has patient fallen in last 6 months? No  LIVING ENVIRONMENT: Has steps at her house   OCCUPATION:  Works for [employer].  Off this week but remote next week.   PLOF: Independent  PATIENT GOALS:    NEXT MD VISIT:  Next week   OBJECTIVE:  Note: Objective measures were completed at Evaluation unless otherwise noted.  DIAGNOSTIC FINDINGS:  Nothing post op   PATIENT SURVEYS:  LEFS  Extreme difficulty/unable (0), Quite a bit of difficulty (1), Moderate difficulty (2), Little difficulty (3), No difficulty (4) Survey date:   9/10  Any of your usual work, housework or school activities  4  2. Usual hobbies, recreational or sporting activities  3  3. Getting into/out of the bath  4  4. Walking between rooms  4  5. Putting on socks/shoes  4  6. Squatting   4  7. Lifting an object, like a bag of groceries from the floor  4  8. Performing light activities around your home  4  9. Performing heavy activities around your home  3  10. Getting into/out of a car  4  11. Walking 2 blocks  4  12. Walking 1 mile  4  13. Going up/down 10 stairs (1 flight)  4  14. Standing for 1 hour  4  15.  sitting for 1 hour  4  16. Running on even ground  3  17. Running on uneven ground  2  18. Making sharp turns while running fast  1  19.  Hopping   3  20. Rolling over in bed  4  Score total:  14/80      COGNITION: Overall cognitive status: Within functional limits for tasks assessed     SENSATION: WFL   POSTURE: No Significant postural limitations  PALPATION: No unexpected TTP  LOWER EXTREMITY ROM:  Passive ROM Right eval Left eval L 9/10  Hip flexion  90   Hip extension     Hip abduction     Hip adduction     Hip internal rotation     Hip external rotation  18   Knee flexion     Knee extension     Ankle dorsiflexion     Ankle plantarflexion     Ankle inversion     Ankle eversion      (Blank rows = not tested)  LOWER EXTREMITY MMT:  MMT Right 9/10 Left 9/10 Right  9/24 Left 9/24  Hip flexion 50.1 49.1 50.1 49.1  Hip extension 40.6 33.6 40.6 33.6  Hip abduction 46.6 44.1 46.6 44.1  Hip adduction 37.9 36.7 37.9 36.7  Hip internal rotation 20.7 15.0 20.7 10.0  Hip external rotation 13.5 19.4 13.5 19.4  Knee flexion      Knee extension 40.3 61.8 40.3 61.8  Ankle dorsiflexion      Ankle plantarflexion      Ankle inversion      Ankle eversion       (Blank rows = not tested)   GAIT: WFL  Step down test: 20x L and R ( not passed on L)                                                                                                                                TREATMENT DATE:   10/22  Manual: roller to left quad/iliopsoas, LAD to LLE with grade I-II oscillations, grade I-II hip AP mobilization   Couch stretch with reach 30s 2x Self band hip ext mob 2x10 3s  Deep TRX lateral lunge/deep squat 2x10 ea  Test-retest with thruster (stiffness improves) Banded SL jumping 3x12 (pliant landing vs amplitude)   10/13  Mulligan lateral, inf mob grade III; lateral  mob with IR/ER grade III  Semi-reclined hip 90/90 20x Seated hip figure 4 with rotation 3s 10x Sideplank with rotation 4x6 Curtsy lunge with UE assist 3x8 DL box jump 3x5 (pliant landing vs stiff) Split squat jump 3x8 (deep  tier plyo)   [facility] return to running protocol  10/2 Neuro-re-ed: Single leg stance on air-ex 2x30 sec hold  Hop and hold fwd and lateral 2x15  Goblet squats 3x12 15 lbs in mirror for weight shift   There-ex Leg press LF 55 lbs 3x12  Knee extension 25 lbs 3x12   09/30 Manual: roller to left quad/iliopsoas, LAD to LLE with grade I-II oscillations, grade I-II hip AP mobilization   Neuro-re-ed: Single leg stance on air-ex 2x30 sec hold  Hop and hold fwd and lateral x15   Cable walk x10 165 lbs fwd and back  Single leg cone drill 3x10    9/24 Recumbent bike lvl 3 Manual: roller to left quad/iliopsoas, LAD to LLE with grade I-II oscillations, grade I-II hip AP mobilization  DL to SL airex pad plyo progression 2x10 Lateral step plyo progression on airex pad 2x10 Anterior-Posterior weight shifts on airex foam pad 2x10  Single leg stance on airex pad 2x30 seconds      9/15 Recumbent bike warm up level 3 Pendulum lunge 3x10 Hip thrust 3x8 40lbs  Weighted bench step up 10lbs each hand 2x16 Captain morgan 5s 2x5 DL to SL light tier plyo progression: DL hop to scissor hop 2x3 ea Deep lateral step down 3x8  9/10 Recumbent bike warm up level 3 Strength testing and edu of strength test results DL jumping 69d ML and AP  HEP updates, gym safety Mulligan lateral grade III and inf grade III Posterior L hip mob grade IV STM L glute  9/8 Upright bike lvl 5 5 min 25lb goblet 6x, 30lb 2x6 goblet SL RDL 3x8 SL bridge off box [id number] Hip flexor march in standing YTB 3x10 Side plank 2x5 each RTB at knees Standing adductor slider 2x10        PATIENT EDUCATION:  Education details: HEP, symptom management  Person educated: Patient Education method: Explanation, Demonstration, Tactile cues, Verbal cues, and Handouts Education comprehension: verbalized understanding, returned demonstration, verbal cues required, tactile cues required, and needs further education  HOME EXERCISE  PROGRAM: Access Code: [email or web address] URL: [email or web address] Date: 02/26/2024 Prepared by: Alm Don   ASSESSMENT:  CLINICAL IMPRESSION:     Pt is 17 wks post op a this time. Pt has started [facility] returning to joggin progression without discomfort or irritation to the hip. Pt is limited by hip extension and closed pack position due to L hip joint stiffness. Pain with thruster and squatting motion improves with mobility type exercise. HEP updated accordingly. Pt to decrease frequency of visits after next for guided return to full activity. Plan to continue with progression of SL stability, strength, and return to exercise/running progression. Consider progression of SL jumping exercise and static position decel introduction.   OBJECTIVE IMPAIRMENTS: Abnormal gait, decreased activity tolerance, difficulty walking, decreased ROM, decreased strength, and pain.   ACTIVITY LIMITATIONS: carrying, lifting, sitting, standing, squatting, sleeping, stairs, transfers, bed mobility, bathing, and dressing  PARTICIPATION LIMITATIONS: meal prep, cleaning, laundry, driving, shopping, community activity, and occupation  PERSONAL FACTORS: None   REHAB POTENTIAL: Excellent  CLINICAL DECISION MAKING: Stable/uncomplicated  EVALUATION COMPLEXITY: Low   GOALS: Goals reviewed with patient? Yes  SHORT TERM GOALS: Target date: 03/25/2024   Patient will increase passive right hip flexion 90 degrees  Baseline: Goal status: achieved 7/23  2.  Patient will wean off assistive device as tolerated Baseline:  Goal status: achieved 7/23  3.  Patient will be independent with basic HEP Baseline:  Goal status: achieved 9/30   LONG TERM GOALS: Target date: 05/20/2024    Patient will go up and down 8 steps with reciprocal gait pattern Baseline:  Goal status: achieved 9/24  2.  Patient will ambulate community distances without increased pain Baseline:  Goal status: achieved 9/24  3.   Patient will stand for greater than 1 hour without increased pain in order to perform ADLs Baseline:  Goal status: achieved 9/24  4.  Patient will return to full exercise routine Baseline:  Goal status: still having mild pain     PLAN:  PT FREQUENCY: 2x/week  PT DURATION: 12 weeks  PLANNED INTERVENTIONS: 97110-Therapeutic exercises, 97530- Therapeutic activity, [id number]- Neuromuscular re-education, 97535- Self Care, 02859- Manual therapy, [id number]- Gait training, [phone]- Aquatic Therapy, [id number]- Electrical stimulation (unattended), 97035- Ultrasound, Patient/Family education, Stair training, Taping, Dry Needling, DME instructions, Cryotherapy, and Moist heat   PLAN FOR NEXT SESSION:  Begin per hip labral protocol  Dale Call PT, DPT 06/22/24 11:02 AM

## 2024-06-27 ENCOUNTER — Ambulatory Visit (HOSPITAL_BASED_OUTPATIENT_CLINIC_OR_DEPARTMENT_OTHER): Admitting: Physical Therapy

## 2024-07-04 ENCOUNTER — Encounter: Payer: Self-pay | Admitting: Radiology

## 2024-07-09 LAB — GENECONNECT MOLECULAR SCREEN: Genetic Analysis Overall Interpretation: NEGATIVE

## 2024-07-22 ENCOUNTER — Encounter (HOSPITAL_BASED_OUTPATIENT_CLINIC_OR_DEPARTMENT_OTHER): Payer: Self-pay | Admitting: Physical Therapy

## 2024-07-22 ENCOUNTER — Ambulatory Visit (HOSPITAL_BASED_OUTPATIENT_CLINIC_OR_DEPARTMENT_OTHER): Payer: Self-pay | Attending: Family Medicine | Admitting: Physical Therapy

## 2024-07-22 DIAGNOSIS — R29898 Other symptoms and signs involving the musculoskeletal system: Secondary | ICD-10-CM | POA: Diagnosis not present

## 2024-07-22 DIAGNOSIS — R2689 Other abnormalities of gait and mobility: Secondary | ICD-10-CM | POA: Diagnosis not present

## 2024-07-22 DIAGNOSIS — M25552 Pain in left hip: Secondary | ICD-10-CM | POA: Insufficient documentation
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
## 2024-08-03 ENCOUNTER — Ambulatory Visit (HOSPITAL_BASED_OUTPATIENT_CLINIC_OR_DEPARTMENT_OTHER): Attending: Family Medicine | Admitting: Physical Therapy

## 2024-08-03 ENCOUNTER — Encounter (HOSPITAL_BASED_OUTPATIENT_CLINIC_OR_DEPARTMENT_OTHER): Payer: Self-pay | Admitting: Physical Therapy

## 2024-08-03 DIAGNOSIS — M25552 Pain in left hip: Secondary | ICD-10-CM | POA: Diagnosis present

## 2024-08-03 DIAGNOSIS — R2689 Other abnormalities of gait and mobility: Secondary | ICD-10-CM | POA: Insufficient documentation
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
## 2024-08-04 ENCOUNTER — Encounter (HOSPITAL_BASED_OUTPATIENT_CLINIC_OR_DEPARTMENT_OTHER): Payer: Self-pay | Admitting: Physical Therapy

## 2024-08-08 DIAGNOSIS — Z0000 Encounter for general adult medical examination without abnormal findings: Secondary | ICD-10-CM | POA: Diagnosis not present

## 2024-08-08 DIAGNOSIS — J301 Allergic rhinitis due to pollen: Secondary | ICD-10-CM | POA: Diagnosis not present

## 2024-08-08 DIAGNOSIS — Z13220 Encounter for screening for lipoid disorders: Secondary | ICD-10-CM | POA: Diagnosis not present

## 2024-08-09 ENCOUNTER — Encounter (HOSPITAL_COMMUNITY): Payer: Self-pay | Admitting: *Deleted

## 2024-08-09 ENCOUNTER — Other Ambulatory Visit (HOSPITAL_COMMUNITY): Payer: Self-pay

## 2024-08-09 ENCOUNTER — Ambulatory Visit (HOSPITAL_COMMUNITY)

## 2024-08-09 ENCOUNTER — Other Ambulatory Visit: Payer: Self-pay

## 2024-08-09 ENCOUNTER — Ambulatory Visit (HOSPITAL_COMMUNITY): Admission: EM | Admit: 2024-08-09 | Discharge: 2024-08-09 | Disposition: A | Discharge status: Home / Self Care

## 2024-08-09 DIAGNOSIS — M546 Pain in thoracic spine: Secondary | ICD-10-CM | POA: Diagnosis not present

## 2024-08-09 DIAGNOSIS — M62838 Other muscle spasm: Secondary | ICD-10-CM

## 2024-08-09 MED ORDER — METHOCARBAMOL 750 MG PO TABS
750.0000 mg | ORAL_TABLET | Freq: Four times a day (QID) | ORAL | 0 refills | Status: AC | PRN
  Filled 2024-08-09: qty 28, 7d supply, fill #0
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
## 2024-08-15 ENCOUNTER — Other Ambulatory Visit (HOSPITAL_COMMUNITY): Payer: Self-pay

## 2024-08-15 MED ORDER — METRONIDAZOLE 0.75 % EX GEL
1.0000 | Freq: Two times a day (BID) | CUTANEOUS | 1 refills | Status: AC
  Filled 2024-08-15: qty 45, 23d supply, fill #0

## 2024-08-17 ENCOUNTER — Ambulatory Visit (HOSPITAL_BASED_OUTPATIENT_CLINIC_OR_DEPARTMENT_OTHER): Admitting: Orthopaedic Surgery

## 2024-08-19 ENCOUNTER — Telehealth

## 2024-08-19 DIAGNOSIS — H109 Unspecified conjunctivitis: Secondary | ICD-10-CM | POA: Diagnosis not present

## 2024-08-20 MED ORDER — OFLOXACIN 0.3 % OP SOLN: 1.0000 [drp] | Freq: Four times a day (QID) | OPHTHALMIC | 0 refills | Status: AC

## 2024-08-20 NOTE — Progress Notes (Signed)

## 2024-09-07 ENCOUNTER — Ambulatory Visit (HOSPITAL_BASED_OUTPATIENT_CLINIC_OR_DEPARTMENT_OTHER): Admitting: Orthopaedic Surgery

## 2024-09-07 DIAGNOSIS — S73192A Other sprain of left hip, initial encounter: Secondary | ICD-10-CM

## 2024-09-07 DIAGNOSIS — M25551 Pain in right hip: Secondary | ICD-10-CM | POA: Diagnosis not present

## 2024-09-07 MED ORDER — TRIAMCINOLONE ACETONIDE 40 MG/ML IJ SUSP
80.0000 mg | INTRAMUSCULAR | Status: AC | PRN
  Administered 2024-09-07: 80 mg via INTRA_ARTICULAR

## 2024-09-07 MED ORDER — LIDOCAINE HCL 1 % IJ SOLN
4.0000 mL | INTRAMUSCULAR | Status: AC | PRN
  Administered 2024-09-07: 4 mL

## 2024-09-07 NOTE — Progress Notes (Signed)
 "                                Post Operative Evaluation    Procedure/Date of Surgery: Left hip arthroscopy with labral repair 6/24  Interval History:   Presents 7 months status post left hip arthroscopy.  She has been having some psoas type symptoms recently.  This is popped up somewhat out of the blue and has been painful along the front of the hip   PMH/PSH/Family History/Social History/Meds/Allergies:    Past Medical History:  Diagnosis Date   Depression    Fibroid    Medical history non-contributory    Past Surgical History:  Procedure Laterality Date   CESAREAN SECTION N/A 09/10/2017   Procedure: CESAREAN SECTION;  Surgeon: Dannielle Bouchard, DO;  Location: [facility];  Service: Obstetrics;  Laterality: N/A;  Primary edc 09/16/17 NKDA [name] RNFA   CESAREAN SECTION N/A 08/29/2019   Procedure: CESAREAN SECTION;  Surgeon: Dannielle Bouchard, DO;  Location: [facility];  Service: Obstetrics;  Laterality: N/A;  REPEAT EDC 09/08/19 NKDA [name],  RNFA   WISDOM TOOTH EXTRACTION     Social History   Socioeconomic History   Marital status: Married    Spouse name: Not on file   Number of children: Not on file   Years of education: Not on file   Highest education level: Not on file  Occupational History   Not on file  Tobacco Use   Smoking status: Never   Smokeless tobacco: Never  Vaping Use   Vaping status: Never Used  Substance and Sexual Activity   Alcohol use: No    Comment: not since pregnancy   Drug use: No   Sexual activity: Not Currently    Comment: due to pain asssociated with intercourse   Other Topics Concern   Not on file  Social History Narrative   Not on file   Social Drivers of Health   Tobacco Use: Low Risk (08/09/2024)   Patient History    Smoking Tobacco Use: Never    Smokeless Tobacco Use: Never    Passive Exposure: Not on file  Financial Resource Strain: Not on file  Food Insecurity: Not on file  Transportation  Needs: Not on file  Physical Activity: Not on file  Stress: Not on file  Social Connections: Not on file  Depression (EYV7-0): Not on file  Alcohol Screen: Not on file  Housing: Not on file  Utilities: Not on file  Health Literacy: Not on file   Family History  Problem Relation Age of Onset   Hypertension Paternal Grandmother    Cancer Paternal Grandmother    Cancer Paternal Grandfather    Cancer Maternal Grandmother    Kidney Stones Mother    Diabetes Paternal Uncle    Allergies  Allergen Reactions   Sulfamethoxazole -Trimethoprim  Rash   Current Outpatient Medications  Medication Sig Dispense Refill   aspirin  EC 325 MG tablet Take 1 tablet (325 mg total) by mouth daily. 14 tablet 0   LORazepam  (ATIVAN ) 0.5 MG tablet Take 1 tablet by mouth 30-60 minutes prior to procedure, can repeat dose once at time of procedure if needed. 2 tablet 0   methocarbamol  (ROBAXIN ) 750 MG tablet Take 1 tablet (750 mg total) by mouth 4 (four) times daily as needed for muscle spasms. 28 tablet 0   metroNIDAZOLE  (METROGEL ) 0.75 % gel Apply 1 Application topically daily. 45 g 0   metroNIDAZOLE  (  METROGEL ) 0.75 % gel Apply 1 Application topically 2 (two) times daily. 45 g 1   oxyCODONE  (ROXICODONE ) 5 MG immediate release tablet Take 1 tablet (5 mg total) by mouth every 4 (four) hours as needed for severe pain (pain score 7-10) or breakthrough pain. 10 tablet 0   No current facility-administered medications for this visit.   No results found.  Review of Systems:   A ROS was performed including pertinent positives and negatives as documented in the HPI.   Musculoskeletal Exam:      Left hip incisions are well-appearing without erythema or drainage.  Walks with a normal gait.  30 degrees internal/external rotation without pain.  Good abduction strength  Imaging:      I personally reviewed and interpreted the radiographs.   Assessment:   7 months status post left hip arthroscopic  labral repair today with some additional psoas symptoms.  Given this I have recommended ultrasound-guided injection of the psoas.  She will follow-up with us  in 3 months Plan :    - Return to clinic 3 months    Procedure Note  Patient: Jasmine Conrad             Date of Birth: [birth year]           MRN: [id number]             Visit Date: 09/07/2024  Procedures: Visit Diagnoses:  1. Pain in right hip   2. Tear of left acetabular labrum, initial encounter     Large Joint Inj: L hip joint on 09/07/2024 12:37 PM Indications: pain Details: 22 G 3.5 in needle, ultrasound-guided anterolateral approach  Arthrogram: No  Medications: 4 mL lidocaine  1 %; 80 mg triamcinolone  acetonide 40 MG/ML Outcome: tolerated well, no immediate complications Procedure, treatment alternatives, risks and benefits explained, specific risks discussed. Consent was given by the patient. Immediately prior to procedure a time out was called to verify the correct patient, procedure, equipment, support staff and site/side marked as required. Patient was prepped and draped in the usual sterile fashion.         I personally saw and evaluated the patient, and participated in the management and treatment plan.  Elspeth Parker, MD Attending Physician, Orthopedic Surgery  This document was dictated using Dragon voice recognition software. A reasonable attempt at proof reading has been made to minimize errors. "

## 2024-09-14 ENCOUNTER — Other Ambulatory Visit (HOSPITAL_COMMUNITY): Payer: Self-pay

## 2024-09-14 MED ORDER — PHENAZOPYRIDINE HCL 200 MG PO TABS
200.0000 mg | ORAL_TABLET | Freq: Three times a day (TID) | ORAL | 0 refills | Status: AC
  Filled 2024-09-14: qty 6, 2d supply, fill #0

## 2024-09-14 MED ORDER — HYDROXYZINE HCL 25 MG PO TABS
25.0000 mg | ORAL_TABLET | Freq: Every day | ORAL | 0 refills | Status: AC
  Filled 2024-09-14: qty 30, 30d supply, fill #0

## 2024-12-07 ENCOUNTER — Ambulatory Visit (HOSPITAL_BASED_OUTPATIENT_CLINIC_OR_DEPARTMENT_OTHER): Admitting: Orthopaedic Surgery
